# Patient Record
Sex: Female | Born: 1960 | Race: White | Hispanic: No | Marital: Single
Health system: Southern US, Academic
[De-identification: ages and names within clinical notes are randomized; demographics above are authoritative.]

## PROBLEM LIST (undated history)

## (undated) ENCOUNTER — Encounter

## (undated) ENCOUNTER — Ambulatory Visit

## (undated) ENCOUNTER — Telehealth

## (undated) ENCOUNTER — Inpatient Hospital Stay: Payer: PRIVATE HEALTH INSURANCE

## (undated) ENCOUNTER — Encounter: Attending: Surgery | Primary: Surgery

## (undated) ENCOUNTER — Telehealth: Attending: Social Worker | Primary: Social Worker

## (undated) ENCOUNTER — Ambulatory Visit
Payer: BLUE CROSS/BLUE SHIELD | Attending: Student in an Organized Health Care Education/Training Program | Primary: Student in an Organized Health Care Education/Training Program

## (undated) ENCOUNTER — Ambulatory Visit: Payer: BLUE CROSS/BLUE SHIELD

## (undated) ENCOUNTER — Ambulatory Visit: Payer: PRIVATE HEALTH INSURANCE

## (undated) ENCOUNTER — Encounter: Attending: Dermatology | Primary: Dermatology

## (undated) ENCOUNTER — Encounter: Attending: Adult Health | Primary: Adult Health

## (undated) ENCOUNTER — Inpatient Hospital Stay

## (undated) ENCOUNTER — Encounter: Attending: Nephrology | Primary: Nephrology

## (undated) ENCOUNTER — Encounter: Payer: PRIVATE HEALTH INSURANCE | Attending: Dermatology | Primary: Dermatology

## (undated) ENCOUNTER — Encounter
Attending: Student in an Organized Health Care Education/Training Program | Primary: Student in an Organized Health Care Education/Training Program

## (undated) ENCOUNTER — Telehealth
Attending: Student in an Organized Health Care Education/Training Program | Primary: Student in an Organized Health Care Education/Training Program

## (undated) ENCOUNTER — Encounter: Payer: PRIVATE HEALTH INSURANCE | Attending: Ophthalmology | Primary: Ophthalmology

## (undated) ENCOUNTER — Telehealth: Attending: Emergency Medicine | Primary: Emergency Medicine

## (undated) DIAGNOSIS — E119 Type 2 diabetes mellitus without complications: Secondary | ICD-10-CM

## (undated) HISTORY — PX: EYE SURGERY: SHX253

## (undated) MED ORDER — EMPAGLIFLOZIN 10 MG TABLET: Freq: Every day | ORAL | 0.00000 days

---
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
## 2007-10-10 ENCOUNTER — Ambulatory Visit: Payer: Self-pay | Admitting: Nephrology

## 2013-03-20 ENCOUNTER — Ambulatory Visit: Payer: Self-pay

## 2013-04-04 ENCOUNTER — Ambulatory Visit: Payer: Self-pay

## 2014-11-10 ENCOUNTER — Ambulatory Visit (HOSPITAL_COMMUNITY)
Admission: RE | Admit: 2014-11-10 | Discharge: 2014-11-10 | Disposition: A | Discharge status: Home / Self Care | Payer: Self-pay | Source: Ambulatory Visit | Attending: Family Medicine | Admitting: Family Medicine

## 2014-11-10 ENCOUNTER — Other Ambulatory Visit (HOSPITAL_COMMUNITY): Payer: Self-pay | Admitting: Family Medicine

## 2014-11-10 DIAGNOSIS — M7591 Shoulder lesion, unspecified, right shoulder: Secondary | ICD-10-CM

## 2014-11-10 DIAGNOSIS — M25511 Pain in right shoulder: Secondary | ICD-10-CM | POA: Insufficient documentation

## 2016-09-15 ENCOUNTER — Encounter: Payer: Self-pay | Admitting: Emergency Medicine

## 2016-09-15 ENCOUNTER — Emergency Department: Payer: BLUE CROSS/BLUE SHIELD

## 2016-09-15 DIAGNOSIS — S42202A Unspecified fracture of upper end of left humerus, initial encounter for closed fracture: Secondary | ICD-10-CM | POA: Diagnosis not present

## 2016-09-15 DIAGNOSIS — Y939 Activity, unspecified: Secondary | ICD-10-CM | POA: Insufficient documentation

## 2016-09-15 DIAGNOSIS — F17200 Nicotine dependence, unspecified, uncomplicated: Secondary | ICD-10-CM | POA: Diagnosis not present

## 2016-09-15 DIAGNOSIS — Y929 Unspecified place or not applicable: Secondary | ICD-10-CM | POA: Insufficient documentation

## 2016-09-15 DIAGNOSIS — Y999 Unspecified external cause status: Secondary | ICD-10-CM | POA: Insufficient documentation

## 2016-09-15 DIAGNOSIS — W010XXA Fall on same level from slipping, tripping and stumbling without subsequent striking against object, initial encounter: Secondary | ICD-10-CM | POA: Diagnosis not present

## 2016-09-15 DIAGNOSIS — E119 Type 2 diabetes mellitus without complications: Secondary | ICD-10-CM | POA: Insufficient documentation

## 2016-09-15 DIAGNOSIS — S4992XA Unspecified injury of left shoulder and upper arm, initial encounter: Secondary | ICD-10-CM | POA: Diagnosis present

## 2016-09-15 NOTE — ED Triage Notes (Signed)
Pt ambulatory to triage I NAD, report tripped and fell, injury to left shoulder, pain worse with movement, CSM intact

## 2016-09-16 ENCOUNTER — Emergency Department
Admission: EM | Admit: 2016-09-16 | Discharge: 2016-09-16 | Disposition: A | Discharge status: Home / Self Care | Payer: BLUE CROSS/BLUE SHIELD | Attending: Emergency Medicine | Admitting: Emergency Medicine

## 2016-09-16 DIAGNOSIS — S42292A Other displaced fracture of upper end of left humerus, initial encounter for closed fracture: Secondary | ICD-10-CM

## 2016-09-16 HISTORY — DX: Type 2 diabetes mellitus without complications: E11.9

## 2016-09-16 MED ORDER — ONDANSETRON 4 MG PO TBDP
4.0000 mg | ORAL_TABLET | Freq: Once | ORAL | Status: AC
  Administered 2016-09-16: 4 mg via ORAL

## 2016-09-16 MED ORDER — OXYCODONE-ACETAMINOPHEN 5-325 MG PO TABS
ORAL_TABLET | ORAL | Status: AC
  Administered 2016-09-16: 2 via ORAL
  Filled 2016-09-16: qty 2

## 2016-09-16 MED ORDER — OXYCODONE-ACETAMINOPHEN 5-325 MG PO TABS: 1.0000 | ORAL_TABLET | ORAL | 0 refills | Status: DC | PRN

## 2016-09-16 MED ORDER — ONDANSETRON 4 MG PO TBDP
ORAL_TABLET | ORAL | Status: AC
Start: 2016-09-16 — End: 2016-09-16
  Administered 2016-09-16: 4 mg via ORAL
  Filled 2016-09-16: qty 1

## 2016-09-16 MED ORDER — OXYCODONE-ACETAMINOPHEN 5-325 MG PO TABS
2.0000 | ORAL_TABLET | Freq: Once | ORAL | Status: AC
  Administered 2016-09-16: 2 via ORAL

## 2016-09-16 NOTE — ED Provider Notes (Signed)
[facility] Emergency Department Provider Note   First MD Initiated Contact with Patient 09/16/16 [phone]     (approximate)  I have reviewed the triage vital signs and the nursing notes.   HISTORY  Chief Complaint Fall    HPI Nicole Wolf is a 56 y.o. female presents with history of accidental trip and fall with injury to the left shoulder girdle before arrival. Patient states that the pain is currently 7 out of 10 and worse with movement. Patient denies any head injury no loss of consciousness.   Past Medical History:  Diagnosis Date  . Diabetes mellitus without complication (HCC)     There are no active problems to display for this patient.   Past surgical history Noncontributory  Prior to Admission medications   Not on File    Allergies Codeine; Morphine and related; and Sulfa antibiotics  History reviewed. No pertinent family history.  Social History Social History  Substance Use Topics  . Smoking status: Current Every Day Smoker  . Smokeless tobacco: Never Used  . Alcohol use No    Review of Systems Constitutional: No fever/chills Eyes: No visual changes. ENT: No sore throat. Cardiovascular: Denies chest pain. Respiratory: Denies shortness of breath. Gastrointestinal: No abdominal pain.  No nausea, no vomiting.  No diarrhea.  No constipation. Genitourinary: Negative for dysuria. Musculoskeletal: Negative for back pain.Positive for left shoulder pain Integumentary: Negative for rash. Neurological: Negative for headaches, focal weakness or numbness.   ____________________________________________   PHYSICAL EXAM:  VITAL SIGNS: ED Triage Vitals  Enc Vitals Group     BP 09/15/16 2259 (!) 152/83     Pulse Rate 09/15/16 2259 78     Resp 09/15/16 2259 18     Temp 09/15/16 2259 97.7 F (36.5 C)     Temp Source 09/15/16 2259 Oral     SpO2 09/15/16 2259 99 %     Weight 09/15/16 2300 160 lb (72.6 kg)     Height 09/15/16 2300  5\' 7"  (1.702 m)     Head Circumference --      Peak Flow --      Pain Score 09/15/16 2259 4     Pain Loc --      Pain Edu? --      Excl. in GC? --     Constitutional: Alert and oriented. Well appearing and in no acute distress. Eyes: Conjunctivae are normal. PERRL. EOMI. Head: Atraumatic. Mouth/Throat: Mucous membranes are moist. Neck: No stridor.  No cervical spine tenderness to palpation. Cardiovascular: Normal rate, regular rhythm. Good peripheral circulation. Grossly normal heart sounds. Respiratory: Normal respiratory effort.  No retractions. Lungs CTAB. Gastrointestinal: Soft and nontender. No distention.  Musculoskeletal: No lower extremity tenderness nor edema. No gross deformities of extremities. Pain left shoulder palpation anteriorly Neurologic:  Normal speech and language. No gross focal neurologic deficits are appreciated.  Skin:  Skin is warm, dry and intact. No rash noted. Psychiatric: Mood and affect are normal. Speech and behavior are normal.  ________________________ RADIOLOGY I, [facility] [place], personally viewed and evaluated these images (plain radiographs) as part of my medical decision making, as well as reviewing the written report by the radiologist.  Dg Shoulder Left  Result Date: 09/15/2016 CLINICAL DATA:  Persistent left shoulder pain after trip and fall injury tonight. EXAM: LEFT SHOULDER - 2+ VIEW COMPARISON:  None. FINDINGS: There is a nondisplaced humeral head fracture, with transverse component across the surgical neck and longitudinal component across the greater tuberosity.  No dislocation. No bone lesion or bony destruction IMPRESSION: Nondisplaced proximal humeral fracture. Electronically Signed   By: Ellery Plunk M.D.   On: 09/15/2016 23:33      Procedures   ____________________________________________   INITIAL IMPRESSION / ASSESSMENT AND PLAN / ED COURSE  Pertinent labs & imaging results that were available during my care of the  patient were reviewed by me and considered in my medical decision making (see chart for details).  Patient given Percocet for pain sling applied. X-ray revealed a humeral head fracture. Patient will be referred to orthopedic surgery for further outpatient evaluation and management.      ____________________________________________  FINAL CLINICAL IMPRESSION(S) / ED DIAGNOSES  Final diagnoses:  Humeral head fracture, left, closed, initial encounter     MEDICATIONS GIVEN DURING THIS VISIT:  Medications  oxyCODONE-acetaminophen (PERCOCET/ROXICET) 5-325 MG per tablet 2 tablet (2 tablets Oral Given 09/16/16 0254)  ondansetron (ZOFRAN-ODT) disintegrating tablet 4 mg (4 mg Oral Given 09/16/16 0254)     NEW OUTPATIENT MEDICATIONS STARTED DURING THIS VISIT:  New Prescriptions   No medications on file    Modified Medications   No medications on file    Discontinued Medications   No medications on file     Note:  This document was prepared using Dragon voice recognition software and may include unintentional dictation errors.    Darci Current, MD 09/20/16 [phone]

## 2016-09-16 NOTE — ED Notes (Signed)

## 2016-09-16 NOTE — ED Notes (Signed)
Sling applied to left shoulder upon assessment.

## 2016-09-16 NOTE — ED Notes (Signed)
MD BRown at bedside at thsi time.

## 2016-12-30 ENCOUNTER — Ambulatory Visit: Admit: 2016-12-30 | Discharge: 2016-12-30 | Payer: BC Managed Care – PPO

## 2016-12-30 DIAGNOSIS — S42202S Unspecified fracture of upper end of left humerus, sequela: Principal | ICD-10-CM

## 2016-12-30 DIAGNOSIS — M7502 Adhesive capsulitis of left shoulder: Secondary | ICD-10-CM

## 2017-02-08 ENCOUNTER — Ambulatory Visit: Admission: RE | Admit: 2017-02-08 | Discharge: 2017-02-08 | Attending: Dermatology | Admitting: Dermatology

## 2017-02-08 DIAGNOSIS — L818 Other specified disorders of pigmentation: Secondary | ICD-10-CM

## 2017-02-08 DIAGNOSIS — L578 Other skin changes due to chronic exposure to nonionizing radiation: Secondary | ICD-10-CM

## 2017-02-08 DIAGNOSIS — L409 Psoriasis, unspecified: Principal | ICD-10-CM

## 2017-02-08 MED ORDER — CLOBETASOL 0.05 % TOPICAL OINTMENT
5 refills | 0 days | Status: CP
Start: 2017-02-08 — End: ?

## 2017-04-19 ENCOUNTER — Ambulatory Visit: Admission: RE | Admit: 2017-04-19 | Discharge: 2017-04-19

## 2017-04-19 DIAGNOSIS — N184 Chronic kidney disease, stage 4 (severe): Principal | ICD-10-CM

## 2017-04-19 DIAGNOSIS — E1165 Type 2 diabetes mellitus with hyperglycemia: Secondary | ICD-10-CM

## 2017-04-19 DIAGNOSIS — N183 Chronic kidney disease, stage 3 (moderate): Secondary | ICD-10-CM

## 2017-04-19 DIAGNOSIS — Z794 Long term (current) use of insulin: Secondary | ICD-10-CM

## 2017-04-19 DIAGNOSIS — E1122 Type 2 diabetes mellitus with diabetic chronic kidney disease: Secondary | ICD-10-CM

## 2017-04-19 DIAGNOSIS — E559 Vitamin D deficiency, unspecified: Secondary | ICD-10-CM

## 2017-04-19 DIAGNOSIS — Q8781 Alport syndrome: Secondary | ICD-10-CM

## 2017-04-19 DIAGNOSIS — D631 Anemia in chronic kidney disease: Secondary | ICD-10-CM

## 2017-08-03 ENCOUNTER — Ambulatory Visit
Admit: 2017-08-03 | Discharge: 2017-08-04 | Payer: PRIVATE HEALTH INSURANCE | Attending: Ophthalmology | Primary: Ophthalmology

## 2017-08-03 DIAGNOSIS — H2633 Drug-induced cataract, bilateral: Principal | ICD-10-CM

## 2017-08-21 ENCOUNTER — Encounter: Admit: 2017-08-21 | Discharge: 2017-08-22 | Payer: PRIVATE HEALTH INSURANCE

## 2017-08-21 DIAGNOSIS — E1122 Type 2 diabetes mellitus with diabetic chronic kidney disease: Secondary | ICD-10-CM

## 2017-08-21 DIAGNOSIS — E785 Hyperlipidemia, unspecified: Secondary | ICD-10-CM

## 2017-08-21 DIAGNOSIS — R112 Nausea with vomiting, unspecified: Secondary | ICD-10-CM

## 2017-08-21 DIAGNOSIS — Z87898 Personal history of other specified conditions: Secondary | ICD-10-CM

## 2017-08-21 DIAGNOSIS — Z9889 Other specified postprocedural states: Secondary | ICD-10-CM

## 2017-08-21 DIAGNOSIS — I10 Essential (primary) hypertension: Secondary | ICD-10-CM

## 2017-08-21 DIAGNOSIS — N184 Chronic kidney disease, stage 4 (severe): Secondary | ICD-10-CM

## 2017-08-21 DIAGNOSIS — Z720 Tobacco use: Secondary | ICD-10-CM

## 2017-08-21 DIAGNOSIS — Z01818 Encounter for other preprocedural examination: Principal | ICD-10-CM

## 2017-08-21 DIAGNOSIS — Z794 Long term (current) use of insulin: Secondary | ICD-10-CM

## 2017-08-28 DIAGNOSIS — H2511 Age-related nuclear cataract, right eye: Principal | ICD-10-CM

## 2017-08-29 ENCOUNTER — Encounter
Admit: 2017-08-29 | Discharge: 2017-08-29 | Payer: PRIVATE HEALTH INSURANCE | Attending: Pain Medicine | Primary: Pain Medicine

## 2017-08-29 ENCOUNTER — Encounter: Admit: 2017-08-29 | Discharge: 2017-08-29 | Payer: PRIVATE HEALTH INSURANCE

## 2017-08-29 DIAGNOSIS — H2511 Age-related nuclear cataract, right eye: Principal | ICD-10-CM

## 2017-08-30 ENCOUNTER — Encounter
Admit: 2017-08-30 | Discharge: 2017-08-31 | Payer: PRIVATE HEALTH INSURANCE | Attending: Ophthalmology | Primary: Ophthalmology

## 2017-08-30 DIAGNOSIS — Z961 Presence of intraocular lens: Secondary | ICD-10-CM

## 2017-08-30 DIAGNOSIS — Z9841 Cataract extraction status, right eye: Principal | ICD-10-CM

## 2017-09-07 ENCOUNTER — Encounter
Admit: 2017-09-07 | Discharge: 2017-09-08 | Payer: PRIVATE HEALTH INSURANCE | Attending: Ophthalmology | Primary: Ophthalmology

## 2017-09-07 DIAGNOSIS — Z9841 Cataract extraction status, right eye: Principal | ICD-10-CM

## 2017-09-07 DIAGNOSIS — Z961 Presence of intraocular lens: Secondary | ICD-10-CM

## 2017-09-18 DIAGNOSIS — H2512 Age-related nuclear cataract, left eye: Principal | ICD-10-CM

## 2017-09-19 ENCOUNTER — Encounter
Admit: 2017-09-19 | Discharge: 2017-09-19 | Payer: PRIVATE HEALTH INSURANCE | Attending: Anesthesiology | Primary: Anesthesiology

## 2017-09-19 ENCOUNTER — Encounter: Admit: 2017-09-19 | Discharge: 2017-09-19 | Payer: PRIVATE HEALTH INSURANCE

## 2017-09-19 DIAGNOSIS — H2512 Age-related nuclear cataract, left eye: Principal | ICD-10-CM

## 2017-09-19 MED ORDER — PREDNISOLONE ACETATE 1 % EYE DROPS,SUSPENSION
Freq: Four times a day (QID) | OPHTHALMIC | 1 refills | 0 days | Status: CP
Start: 2017-09-19 — End: 2017-11-06

## 2017-09-19 MED ORDER — MOXIFLOXACIN 0.5 % EYE DROPS
Freq: Four times a day (QID) | OPHTHALMIC | 0 refills | 0 days | Status: CP
Start: 2017-09-19 — End: 2017-09-29

## 2017-09-20 ENCOUNTER — Encounter
Admit: 2017-09-20 | Discharge: 2017-09-21 | Payer: PRIVATE HEALTH INSURANCE | Attending: Ophthalmology | Primary: Ophthalmology

## 2017-09-20 DIAGNOSIS — Z9841 Cataract extraction status, right eye: Principal | ICD-10-CM

## 2017-09-20 DIAGNOSIS — Z9842 Cataract extraction status, left eye: Secondary | ICD-10-CM

## 2017-09-20 DIAGNOSIS — Z961 Presence of intraocular lens: Secondary | ICD-10-CM

## 2017-09-20 DIAGNOSIS — H1830 Unspecified corneal membrane change: Secondary | ICD-10-CM

## 2017-09-25 ENCOUNTER — Ambulatory Visit
Admit: 2017-09-25 | Discharge: 2017-09-26 | Payer: PRIVATE HEALTH INSURANCE | Attending: Dermatology | Primary: Dermatology

## 2017-09-25 DIAGNOSIS — L409 Psoriasis, unspecified: Principal | ICD-10-CM

## 2017-09-25 MED ORDER — HALOBETASOL PROPIONATE 0.05 % TOPICAL OINTMENT
5 refills | 0 days | Status: CP
Start: 2017-09-25 — End: ?

## 2017-09-25 NOTE — Unmapped (Signed)
??  Dermatology Clinic Outpatient     ??  Assessment and Plan:   ??  1. 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  Patient is significantly bothered by psoriatic lesions given chronic nature. Denies joint pain at this time.   - Approximately 5% TBSA involvement  - Discussed condition, chronicity, and management strategies including re-trial of alternative potent topical steroid cream in case tachyphylaxis has occurred, systemic management with apremilast, biologic therapy  - A mutual decision was made to start Halobetasol ointment BID to lesions pending insurance approval   - Apremilast starter pack was sent to [facility] with 30 mg qd dosing. Will need approval from Nephrologist Hazle Coca) given underlying kidney disease if approved. Methotrexate is not a safe medication given renal impairment  - If not approved or recommended by nephrologist, biologic therapy would be suitable for better control   - May obtain 10 minutes of natural sunlight daily to help with psoriasis. Avoid tanning bed use.   - Discussed importance of cutting down on smoking for general health and pso    2. 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  - Recommended routine skin protection including sunscreen.      RTC: pending medication approval or 3 months whichever is sooner     Subjective:   ??  Chief Complaint:   Chief Complaint   Patient presents with   ??? Psoriasis     breaking out on both legs       HPI: This is a pleasant 57 y.o. female who is seen today in follow up for management of psoriasis affecting elbows and lower legs. Applying clobetasol ointment daily under occlusion on elbows and lower legs without sig improvement. Used under occlusion without sig difference. Is very bothered by psoriatic lesions-due to appearance and recalcitrance. Has a history of Alport's disease and renal dysfunction. Followed by nephrologist-no active intervention yet and not on medication.   Has history of osteroarthritis but denies new onset or worsening, joint stiffness.     She denies any other new, changing, bleeding or worrisome skin lesions. She has no other skin concerns.      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 No recent illnesses, denies fevers, chills, loss of apetite or weight changes. No other skin complaints. Other than the HPI, the balance of 10 system reviewed is negative.    Objective:   ??  Physical Examination:  General: Well-developed, well-nourished female in no acute distress, resting comfortably.  Neuro: A&Ox 3. Answers questions appropriately.  Skin: Examination of the face, neck, chest, abdomen, back, bilateral upper extremities, bilateral lower extremities, palms, and nails was performed and notable for the following:  - nail pitting seen on few fingernails  - pink to red papules coalescing into hypertrophic plaques with overlying bilateral elbows and proximal forearms  - scattered, scaly round plaques on bilateral lower extremities  ______________________________________________________________________  The patient was seen and examined by Sheldon Silvan, MD who agrees with the assessment and plan as above.

## 2017-09-26 MED ORDER — APREMILAST 30 MG TABLET: 30 mg | tablet | 1 refills | 0 days

## 2017-09-26 MED ORDER — APREMILAST 30 MG TABLET
ORAL_TABLET | ORAL | 3 refills | 0.00000 days | Status: CP
Start: 2017-09-26 — End: 2017-09-26

## 2017-09-26 MED ORDER — APREMILAST 10 MG (4)-20 MG (4)-30 MG (19) TABLETS IN A DOSE PACK
PACK | 0 refills | 0 days | Status: CP
Start: 2017-09-26 — End: 2018-07-26

## 2017-09-26 MED ORDER — APREMILAST 10 MG (4)-20 MG (4)-30 MG (47) TABLETS IN A DOSE PACK
ORAL_TABLET | 0 refills | 0 days | Status: CP
Start: 2017-09-26 — End: 2018-07-26

## 2017-09-26 MED ORDER — APREMILAST 30 MG TABLET: 30 mg | tablet | 3 refills | 0 days

## 2017-09-26 NOTE — Unmapped (Signed)
Per test claim for OTEZLA at the [facility], patient needs Medication Assistance Program for Prior Authorization.

## 2017-09-28 ENCOUNTER — Encounter
Admit: 2017-09-28 | Discharge: 2017-09-29 | Payer: PRIVATE HEALTH INSURANCE | Attending: Ophthalmology | Primary: Ophthalmology

## 2017-09-28 DIAGNOSIS — Z9841 Cataract extraction status, right eye: Principal | ICD-10-CM

## 2017-09-28 DIAGNOSIS — Z9842 Cataract extraction status, left eye: Secondary | ICD-10-CM

## 2017-09-28 DIAGNOSIS — Z961 Presence of intraocular lens: Secondary | ICD-10-CM

## 2017-09-28 NOTE — Unmapped (Signed)
57 yo  WF s/p Phaco/IOL OU  (OD: 08/29/17; OS: 09/19/17) is much improved today. States OS still occasionally scratchy but hasn't started using ATs. Reports compliance with post-op meds, still using Pred QID OD. IOP borderline today, likely steroid response.  - Stop Moxifloxacin OS  - Taper Pred 3-2-1-Stop OU  - Okay to use Blink ATs for irritation.  - RTC Brydon Spahr in 3 weeks for MRx/DFE

## 2017-09-29 NOTE — Unmapped (Signed)
PA denied for Otezla(starter and maintenance dose),awaiting appeal.

## 2017-10-05 NOTE — Unmapped (Signed)
[facility] Specialty Medication Referral: PA APPROVED    Medication (Brand/Generic): OTEZLA    Initial [employer] Test Claim completed with resulted information below:  No PA required  Patient ABLE to fill at [facility]  Insurance Company:  [facility]  Anticipated Copay: $0  Is anticipated copay with a copay card or grant? NO    As Co-pay is under $100 defined limit, per policy there will be no further investigation of need for financial assistance at this time unless patient requests. This referral has been communicated to the provider and handed off to the [facility] team for further processing and filling of prescribed medication.   ______________________________________________________________________  Please utilize this referral for viewing purposes as it will serve as the central location for all relevant documentation and updates.

## 2017-10-16 ENCOUNTER — Encounter: Admit: 2017-10-16 | Discharge: 2017-10-16 | Payer: PRIVATE HEALTH INSURANCE

## 2017-10-16 DIAGNOSIS — D631 Anemia in chronic kidney disease: Secondary | ICD-10-CM

## 2017-10-16 DIAGNOSIS — Q8781 Alport syndrome: Secondary | ICD-10-CM

## 2017-10-16 DIAGNOSIS — N184 Chronic kidney disease, stage 4 (severe): Secondary | ICD-10-CM

## 2017-10-16 LAB — SODIUM: Sodium:SCnc:Pt:Ser/Plas:Qn:: 140

## 2017-10-16 LAB — BASIC METABOLIC PANEL
ANION GAP: 11 mmol/L (ref 9–15)
BLOOD UREA NITROGEN: 41 mg/dL — ABNORMAL HIGH (ref 7–21)
CALCIUM: 9.2 mg/dL (ref 8.5–10.2)
CHLORIDE: 109 mmol/L — ABNORMAL HIGH (ref 98–107)
CO2: 20 mmol/L — ABNORMAL LOW (ref 22.0–30.0)
CREATININE: 2.24 mg/dL — ABNORMAL HIGH (ref 0.60–1.00)
EGFR MDRD AF AMER: 28 mL/min/{1.73_m2} — ABNORMAL LOW (ref >=60)
GLUCOSE RANDOM: 180 mg/dL — ABNORMAL HIGH (ref 65–179)
POTASSIUM: 4.8 mmol/L (ref 3.5–5.0)
SODIUM: 140 mmol/L (ref 135–145)

## 2017-10-16 LAB — PHOSPHORUS: Phosphate:MCnc:Pt:Ser/Plas:Qn:: 3.4

## 2017-10-16 NOTE — Unmapped (Signed)
PCP:  Donzetta Matters, MD    10/25/2017  [facility] [address] [place]  [address]  [place] [address]  Dept: [phone]  Loc: [phone]    Chief Complaint: Alport syndrome, elevated creatinine    HPI:  [name] is a 57 y.o. 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 Her mother also had renal disease but it is unknown if this is due to Alports.     She has uncontrolled type 2 diabetes and is not checking her glucose regularly - her A1c was 8.5% in May. She does have a history of diabetic neuropathy but no known history of diabetic retinopathy. She did suffer from kidney stones in 1998 however has not had any issues with this since.    Her mother passed away since the last office visit.          ROS:   CONSTITUTIONAL: denies fevers or chills, denies unintentional weight loss   CARDIOVASCULAR: denies chest pain, denies dyspnea on exertion, denies leg edema  GASTROINTESTINAL: denies nausea, denies vomiting, denies anorexia  GENITOURINARY: denies dysuria, denies hematuria, denies decreased urinary stream  All 10 systems reviewed and are negative except as listed above.    PAST MEDICAL HISTORY:  Past Medical History:   Diagnosis Date   ??? Alport syndrome    ??? Back pain    ??? Chronic kidney disease    ??? Diabetes mellitus (CMS-HCC)    ??? Hyperlipidemia    ??? Hypertension    ??? Kidney stone    ??? Psoriasis        ALLERGIES  Sulfur-8; Codeine; Glucophage [metformin]; and Morphine    FAMILY HISTORY  Family History   Problem Relation Age of Onset   ??? Kidney disease Mother    ??? Diabetes Mother    ??? Cataracts Mother    ??? Heart attack Father        SOCIAL HISTORY   reports that she has been smoking.  She has been smoking about 1.00 pack per day. She has never used smokeless tobacco. She reports that she does not drink alcohol or use drugs.    MEDICATIONS:  Current Outpatient Medications   Medication Sig Dispense Refill   ??? amitriptyline (ELAVIL) 25 MG tablet AMITRIPTYLINE HCL 25 MG TABS     ??? apremilast (OTEZLA STARTER) 10 mg (4)-20 mg (4)-30 mg(19) DsPk tablet dosepak Take as instructed-starter pack 1 Package 0   ??? apremilast (OTEZLA) 30 mg Tab Please take 30 mg qd once starter pack completed 60 tablet 3   ??? calcipotriene (DOVONEX) 0.005 % cream DOVONEX 0.005 % CREA     ??? cholecalciferol, vitamin D3, (VITAMIN D3) 1,000 unit capsule Take 1,000 Units by mouth daily.     ??? clobetasol (TEMOVATE) 0.05 % ointment Apply to psoriasis rash 2 times daily as needed for rash. At night, apply medicine and then wrap in long sleeves or saran wrap 60 g 5   ??? halobetasol (ULTRAVATE) 0.05 % ointment Apply topically twice a day to psoriasis prn 60 g 5   ??? insulin ASPART (NOVOLOG) 100 unit/mL injection Inject under the skin Three (3) times a day before meals. 10 mL 12   ??? insulin glargine (LANTUS) 100 unit/mL injection Inject 80 Units under the skin nightly.     ??? lisinopril (PRINIVIL,ZESTRIL) 10 MG tablet Take 10 mg by mouth daily.     ??? prednisoLONE  acetate (PRED FORTE) 1 % ophthalmic suspension Administer 1 drop to the right eye Four (4) times a day. 5 mL 1   ??? rosuvastatin calcium (CRESTOR ORAL) Take by mouth.     ??? TRUEPLUS PEN NEEDLE 32 gauge x 5/32 Ndle   10   ??? aspirin (ECOTRIN) 81 MG tablet Take 81 mg by mouth daily.       No current facility-administered medications for this visit.        PHYSICAL EXAM:  Wt Readings from Last 3 Encounters:   10/16/17 73.8 kg (162 lb 11.2 oz)   09/19/17 72.6 kg (160 lb)   08/29/17 72.5 kg (159 lb 14.4 oz)     Temp Readings from Last 3 Encounters:   09/19/17 36.4 ??C (97.5 ??F) (Temporal)   08/29/17 36.3 ??C (97.3 ??F) (Temporal)     BP Readings from Last 5 Encounters:   10/16/17 119/71   09/19/17 109/60   08/29/17 103/60   08/21/17 117/62   04/19/17 105/67     Pulse Readings from Last 3 Encounters:   10/16/17 83   09/19/17 80   08/29/17 78       CONSTITUTIONAL: Alert,well appearing, no distress  HEENT: Moist mucous membranes, oropharynx clear without erythema or exudate  EYES: Extra ocular movements intact. Pupils reactive, sclerae anicteric.  NECK: Supple, no lymphadenopathy  CARDIOVASCULAR: Regular, normal S1/S2 heart sounds, no murmurs, no rubs.   PULM: Clear to auscultation bilaterally  GASTROINTESTINAL: Soft, active bowel sounds, nontender  EXTREMITIES: No lower extremity edema bilaterally.   SKIN: No rashes or lesions  NEUROLOGIC: No focal motor or sensory deficits    MEDICAL DECISION MAKING    03/28/17  HbA1c 8.2%, cholesterol 275, triglyceride 471  GLU 187, BUN 37, CR 2.12, EGFR 26, NA 139, K5.0, CL 105, CO2 19, CA 9.2    03/03/09  sodium 140, potassium 5.1, chloride 109, BUN 34, creatinine 1.82 with eGFR 30, glucose 99, albumin 4.2  Total cholesterol 251, triglycerides 340 and LDL 146.  WBC 8.3, hemoglobin 14.3, hematocrit 43.9 and platelets 181,000.  Hemoglobin A1c 6.9%.    Renal U/S showed right kidney 9.4cm and left 9.9cm.    Two simple cysts in the left kidney in the mid and lower poles measuring  1.3 and 0.9cm in diameter.     Creatinine   Date Value Ref Range Status   10/16/2017 2.24 (H) 0.60 - 1.00 mg/dL Final   29/51/8841 6.60 (H) 0.60 - 1.00 mg/dL Final   63/05/6008 9.32 (H) 0.60 - 1.00 mg/dL Final        Lab Results   Component Value Date    NA 140 10/16/2017    K 4.8 10/16/2017    CL 109 (H) 10/16/2017    CO2 20.0 (L) 10/16/2017    BUN 41 (H) 10/16/2017    CREATININE 2.24 (H) 10/16/2017    GFRAA 28 (L) 10/16/2017    GFRNONAA 23 (L) 10/16/2017    GLU 180 (H) 10/16/2017    CALCIUM 9.2 10/16/2017    PHOS 3.4 10/16/2017       Lab Results   Component Value Date    CALCIUM 9.2 10/16/2017       No results found for: BILITOT, BILIDIR, PROT, ALBUMIN, ALT, AST, ALKPHOS, GGT  No results found for: LABPROT, INR, APTT    HGB   Date Value Ref Range Status   09/27/2016 13.9 12.0 - 16.0 g/dL Final   35/57/3220 25.4 12.0 - 16.0 g/dL Final  Lab Results   Component Value Date    HGB 13.9 09/27/2016     No results found for: IRON, TIBC, FERRITIN    No results found for: A1C    No results found for: COLORU, LABSPEC, LABPH, PROTEINUR, GLUCOSEU, KETONESU, BLOODU, NITRITE]    No results found for: PROTEINUR, CREATUR  No results found for: PCRATIOUR    No results found for: CHOL, C3, C4, ANCA, A1C, ANA, HEPAIGG, HEPAIGM, HEPBIGM, HEPBCAB, HEPBSAG, HEPCAB, HIV12AB      03/03/09, sodium 140, potassium 5.1, chloride  109, BUN 34, creatinine 1.82 with eGFR 30, glucose 99 and albumin  4.2.  ??  Total cholesterol 251, triglycerides 340 and LDL 146.  ??  WBC 8.3, hemoglobin 14.3, hematocrit 43.9 and platelets 181,000.      04/29/08  BUN was 44, creatinine 1.86.   Sodium 141, potassium 4.9, calcium 9.4, phosphorus 4.1, albumin  4.0, AST 17, ALT 14, total cholesterol 231, triglycerides 297,  HDL 37, LDL 135.  Hemoglobin 13.8, TSH 1.48.  A1c was 7.5.    12/24/2007   glucose of 131, BUN of 34,   creatinine of 1.79 with an estimated GFR 31, potassium of 4.9, bicarbonate  of 19, calcium of 9.4, phosphorous of 3.9, albumin of 4.0, and hemoglobin  A1c of 7.3.  ??  A renal ultrasound done on 10/10/2007 showed mildly increased echotexture  of bilateral renal cortices, with the right kidney measuring 9.4  x 4.0 x 4.7 cm, and the left kidney measuring 9.4 x 4.2 x 4.2 cm,  with no hydronephrosis.  There are simple-appearing cysts in the  mid to lower pole of the left kidney.        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    Comment:  There is no evidence of a glomerulonephritis. ??Electron microscopy shows marked  thinning of the lamina densa along glomerular basement membranes associated with  an abnormal immunofluorescence staining pattern for collagen type 4 alpha 5 and  to a lesser degree of 3 subunits (mosaic staining). 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 ??The nephropathy has resulted in thinning of peripheral glomerular  capillary walls and glomerular as well tubulointerstitial fibrosis.    ASSESSMENT/PLAN:    Amy [name] is a 57 y.o. patient with a past medical history significant for DM2, Alport Being seen for reevaluation of her renal disease.     1. 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 Her creatinine remains around 2.3. We will monitor every 6 months.     2. CKD stage 4 - It has been difficult for [place] to get routine blood work on the patient. At this time, I would not make any changes to her medication regimen or care plan. I do recommend continuing the lisinopril and emphasizing the importance of hypertension and glucose control. Her renal biopsy did have a degree of interstitial fibrosis and glomerular sclerosis which are likely indicative of damage from these other conditions as well. The thinning of the basement membrane is likely due to the Alport syndrome.\    - We briefly discussed transplant today. [facility] might be a good referral location for her.   - We briefly discussed dialysis today. She could be a good home therapy candidate    3. DM2 - based  on her description of home blood glucose readings, this is uncontrolled at present. Defer to PCP for management. Metformin contraindicated for EGFR less than 30.    4. Hypertension - currently, this is well-controlled with lisinopril. At this time, I do not recommend any changes to her medication regimen that I would continue her on the ACE inhibitor for renal protection as long as feasible.     Ms.Devorah Tennison will follow up in  6 month(s) with a BMP, Hgb, Phos, vitamin D, PTH      Disclaimer: Much of the narrative of this dictation was acquired using speech recognition software. It is possible that some dictated speech was not transcribed accurately by this system nor detected in the proofing. Such errors are at times unavoidable.

## 2017-10-17 MED FILL — OTEZLA/10/20/30/TBPK: OTEZLA/10/20/30/TBPK | 30 days supply | Qty: 55 | Fill #0

## 2017-10-18 NOTE — Unmapped (Signed)
[facility]   Patient Onboarding/Medication Counseling    Amy [name] is a 57 y.o. female with psoriasis who I am counseling today on initiation of therapy.    Medication: [name]    Verified patient's date of birth / HIPAA.      Education Provided: ??    Dose/Administration discussed: 1 tab (10 mg) day 1-3, 1 tab (20 mg) day 4-5, then 30 mg once daily in the morning thereafter. This medication should be taken  without regard to food.  Stressed the importance of taking medication as prescribed and to contact provider if that changes at any time.  Discussed missed dose instructions.    Storage requirements: this medicine should be stored at room temperature.     Side effects / precautions discussed: Discussed common side effects, including potential gi upset: n/v/d, weigh loss, risk of mood changes/depression. If patient experiences mood changes or severe gi upset, they need to call the doctor.  Patient will receive a drug information handout with shipment.    Handling precautions / disposal reviewed:  n/a.    Drug Interactions: other medications reviewed and up to date in Epic.  No drug interactions identified.    Comorbidities/Allergies: reviewed and up to date in Epic.    Verified therapy is appropriate and should continue      Delivery Information    Medication Assistance provided: Prior Authorization    Anticipated copay of $0 reviewed with patient. Verified delivery address in FSI and reviewed medication storage requirement.    Scheduled delivery date: 10/18/17    Explained that we ship using UPS or courier and this shipment will not require a signature.      Explained the services we provide at [facility] and that each month we would call to set up refills.  Stressed importance of returning phone calls so that we could ensure they receive their medications in time each month.  Informed patient that we should be setting up refills 7-10 days prior to when they will run out of medication.  Informed patient that welcome packet will be sent.      Patient verbalized understanding of the above information as well as how to contact the pharmacy at [phone] option 4 with any questions/concerns.  The pharmacy is open Monday through Friday 8:30am-4:30pm.  A pharmacist is available 24/7 via pager to answer any clinical questions they may have.        Patient Specific Needs      ? Patient has no physical, cognitive, or cultural barriers.    ? Patient prefers to have medications discussed with  Patient     ? Patient is able to read and understand education materials at a high [place] level or above.    ? Patient's primary language is  English           Presenter, broadcasting  [facility] Specialty Pharmacist

## 2017-11-06 ENCOUNTER — Encounter
Admit: 2017-11-06 | Discharge: 2017-11-07 | Payer: PRIVATE HEALTH INSURANCE | Attending: Ophthalmology | Primary: Ophthalmology

## 2017-11-06 DIAGNOSIS — Z9841 Cataract extraction status, right eye: Principal | ICD-10-CM

## 2017-11-06 DIAGNOSIS — Z9842 Cataract extraction status, left eye: Secondary | ICD-10-CM

## 2017-11-06 DIAGNOSIS — Z961 Presence of intraocular lens: Secondary | ICD-10-CM

## 2017-11-06 NOTE — Unmapped (Signed)
57 yo  WF s/p Phaco/IOL OU  (OD: 08/29/17; OS: 09/19/17) is comfortable today. IOP back to baseline with completion of Pred taper. Pt desires to use Readers only.  - Suture left in place because it's buried  - Okay to use +2.50 readers  - RTC Optometry in 6 months for routine follow-up    I discussed and examined this patient with Dr. Arnette Norris. See my assessment and plan above. OJK.

## 2017-11-06 NOTE — Unmapped (Signed)
57 yo  WF s/p Phaco/IOL OU  (OD: 08/29/17; OS: 09/19/17) doing well today.  Reports compliance with post-op meds, still using Pred QID OD. IOP excellent today  - All post op drops now stopped.   - Okay to use Blink ATs for irritation.  -New MRx provided today   -Suture removed at slit lamp OD today  - RTC annual eye exam.

## 2017-11-08 NOTE — Unmapped (Signed)
Patient is doing ok on new medication - does report increase in headaches - were daily at first, but they have decreased in frequency. She has been using ibuprofen to treat. I cautioned against use given her renal disease - we prefer tylenol in her case. I asked her to report worsening or persistence of headaches, so that we could re-consider med or use alternative headache treatment if needed.    [facility] Refill and Clinical Coordination Note  Medication(s): [employer], DOB: 21-Mar-1961  Phone: [phone] (home) , Alternate phone contact: N/A  Shipping address: [address]  [place] [address]  Phone or address changes today?: No  All above HIPAA information verified.  Insurance changes? No    Completed refill and clinical call assessment today to schedule patient's medication shipment from the [facility] [phone]).      MEDICATION RECONCILIATION    Confirmed the medication and dosage are correct and have not changed: Yes, regimen is correct and unchanged.    Were there any changes to your medication(s) in the past month:  No, there are no changes reported at this time.    ADHERENCE    Is this medicine transplant or covered by Medicare Part B? No.    Did you miss any doses in the past 4 weeks? No missed doses reported.  Adherence counseling provided? Not needed     SIDE EFFECT MANAGEMENT    Are you tolerating your medication?:  Gloristine reports side effects of HEADACHES.  Side effect management discussed: using apap - and to let us know if worsens..      Therapy is appropriate and should be continued.    Evidence of clinical benefit: See Epic note from na/ not seen since starting medication      FINANCIAL/SHIPPING    Delivery Scheduled: Yes, Expected medication delivery date: Monday, July 1   Additional medications refilled: No additional medications/refills needed at this time.    The patient will receive an FSI print out for each medication shipped and additional FDA Medication Guides as required.  Patient education from [place] or Robet Leu may also be included in the shipment.    Cincere did not have any additional questions at this time.    Delivery address validated in FSI scheduling system: Yes, address listed above is correct.      We will follow up with patient monthly for standard refill processing and delivery.      Thank you,  Tawanna Solo Shared [facility] Specialty Pharmacist

## 2017-11-13 MED FILL — OTEZLA/30MG/TABS: OTEZLA/30MG/TABS | 30 days supply | Qty: 30 | Fill #0

## 2017-12-05 NOTE — Unmapped (Signed)
Patient reports improvement in GI effects and headaches. Headaches come and go, and she thinks may be related to allergies. She has limited use of ibuprofen. No other issues identified. She does see improvement of her skin. I encouraged her to schedule a follow appointment in the next month or two [name] started ~ June 5).    [facility] Refill and Clinical Coordination Note  Medication(s): [employer], DOB: April 26, 1961  Phone: [phone] (home) , Alternate phone contact: N/A  Shipping address: [address]  [place] [address]  Phone or address changes today?: No  All above HIPAA information verified.  Insurance changes? No    Completed refill and clinical call assessment today to schedule patient's medication shipment from the [facility] [phone]).      MEDICATION RECONCILIATION    Confirmed the medication and dosage are correct and have not changed: Yes, regimen is correct and unchanged.    Were there any changes to your medication(s) in the past month:  No, there are no changes reported at this time.    ADHERENCE    Is this medicine transplant or covered by Medicare Part B? No.    Did you miss any doses in the past 4 weeks? No missed doses reported.  Adherence counseling provided? Not needed     SIDE EFFECT MANAGEMENT    Are you tolerating your medication?:  Mayrin reports tolerating the medication.  Side effect management discussed: None      Therapy is appropriate and should be continued.    Evidence of clinical benefit: See Epic note from na/ not seen since starting      FINANCIAL/SHIPPING    Delivery Scheduled: Yes, Expected medication delivery date: Wed, July 31   Additional medications refilled: No additional medications/refills needed at this time.    The patient will receive an FSI print out for each medication shipped and additional FDA Medication Guides as required.  Patient education from [place] or Robet Leu may also be included in the shipment.    Caylen did not have any additional questions at this time.    Delivery address validated in FSI scheduling system: Yes, address listed above is correct.      We will follow up with patient monthly for standard refill processing and delivery.      Thank you,  Tawanna Solo Shared [facility] Specialty Pharmacist

## 2017-12-11 MED FILL — OTEZLA/30MG/TABS: OTEZLA/30MG/TABS | 30 days supply | Qty: 30 | Fill #1

## 2018-01-03 NOTE — Unmapped (Signed)
Patient is doing ok at this time  Her elbow doesn't look any better or worse  Everything looks about the same  Told her it might take some time to see a difference-she is ok with getting another delivery of it at this time  Asked if she had at least 7 days on hand at this time and she said yes-connection was very spotty-starting asking just yes or no questions    [facility] Refill Coordination Note    Specialty Medication(s) to be Shipped:   Inflammatory Disorders: Otezla    Other medication(s) to be shipped: n/a     [employer], DOB: November 19, 1960  Phone: [phone] (home)   Shipping Address: [address]  [place] [address]    All above HIPAA information was verified with patient.     Completed refill call assessment today to schedule patient's medication shipment from the [facility] [phone]).       Specialty medication(s) and dose(s) confirmed: Regimen is correct and unchanged.   Changes to medications: Cortny reports no changes reported at this time.  Changes to insurance: No  Questions for the pharmacist: No    The patient will receive a drug information handout for each medication shipped and additional FDA Medication Guides as required.      DISEASE/MEDICATION-SPECIFIC INFORMATION        patient has about 7 days on hand at least    ADHERENCE     Medication Adherence    Patient reported X missed doses in the last month:  0  Specialty Medication:  otezla  Patient is on additional specialty medications:  No  Patient is on more than two specialty medications:  No  Any gaps in refill history greater than 2 weeks in the last 3 months:  no  Demonstrates understanding of importance of adherence:  yes  Informant:  patient  Reliability of informant:  reliable  Confirmed plan for next specialty medication refill:  delivery by pharmacy  Refills needed for supportive medications:  not needed          Refill Coordination    Has the Patients' Contact Information Changed: No  Is the Shipping Address Different:  No         SHIPPING     Shipping address confirmed in Epic.     Delivery Scheduled: Yes, Expected medication delivery date: 8/27 (next day courier) via UPS or courier.     Renette Butters   [facility] Specialty Technician

## 2018-01-08 MED FILL — OTEZLA 30 MG TABLET: ORAL | 30 days supply | Qty: 30 | Fill #0

## 2018-01-08 MED FILL — OTEZLA 30 MG TABLET: 30 days supply | Qty: 30 | Fill #0 | Status: AC

## 2018-01-14 ENCOUNTER — Emergency Department
Admission: EM | Admit: 2018-01-14 | Discharge: 2018-01-14 | Disposition: A | Discharge status: Home / Self Care | Payer: BLUE CROSS/BLUE SHIELD | Attending: Emergency Medicine | Admitting: Emergency Medicine

## 2018-01-14 ENCOUNTER — Encounter: Payer: Self-pay | Admitting: Emergency Medicine

## 2018-01-14 DIAGNOSIS — L0231 Cutaneous abscess of buttock: Secondary | ICD-10-CM | POA: Diagnosis not present

## 2018-01-14 DIAGNOSIS — R222 Localized swelling, mass and lump, trunk: Secondary | ICD-10-CM | POA: Diagnosis present

## 2018-01-14 DIAGNOSIS — E119 Type 2 diabetes mellitus without complications: Secondary | ICD-10-CM | POA: Insufficient documentation

## 2018-01-14 DIAGNOSIS — F17210 Nicotine dependence, cigarettes, uncomplicated: Secondary | ICD-10-CM | POA: Diagnosis not present

## 2018-01-14 MED ORDER — CEPHALEXIN 500 MG PO CAPS: 500.0000 mg | ORAL_CAPSULE | Freq: Three times a day (TID) | ORAL | 0 refills | Status: AC

## 2018-01-14 MED ORDER — LIDOCAINE-EPINEPHRINE 2 %-1:100000 IJ SOLN
20.0000 mL | Freq: Once | INTRAMUSCULAR | Status: AC
Start: 2018-01-14 — End: 2018-01-14
  Administered 2018-01-14: 20 mL
  Filled 2018-01-14: qty 20

## 2018-01-14 NOTE — Discharge Instructions (Addendum)
You have been diagnosed with an abscess. We performed an I&D today. I have provided you with a RX for Keflex 500 mg 3 x day for 10 days. Take all the antibiotic even if you are feeling better. Follow up with your PCP as needed.

## 2018-01-14 NOTE — ED Triage Notes (Signed)
Patient reports that she has a "boil" to her rectal area times one month. Patient states that she has taken doxycycline and it has not improved.

## 2018-01-14 NOTE — ED Provider Notes (Signed)
[facility] Emergency Department Provider Note ____________________________________________  Time seen: 2045  I have reviewed the triage vital signs and the nursing notes.  HISTORY  Chief Complaint  Abscess   HPI Nicole Wolf is a 57 y.o. female to the ER today with complaint of a boil to her left buttock.  She reports she initially noticed this about 1 month ago.  She was seen at the [facility] clinic 3 weeks ago, placed on Clindamycin.  She did not notice any improvement with the antibiotics so she followed up with her PCP at the [facility] clinic.  The Clindamycin was stopped and she was switched to Doxycycline.  She finished a course of Doxycycline last night with no improvement in symptoms.  She denies fever, chills or body aches.  She has tried hot compresses without any relief.  Past Medical History:  Diagnosis Date  . Diabetes mellitus without complication (HCC)     There are no active problems to display for this patient.   Past Surgical History:  Procedure Laterality Date  . EYE SURGERY      Prior to Admission medications   Medication Sig Start Date End Date Taking? Authorizing Provider  cephALEXin (KEFLEX) 500 MG capsule Take 1 capsule (500 mg total) by mouth 3 (three) times daily for 10 days. 01/14/18 01/24/18  Lorre Munroe, NP  oxyCODONE-acetaminophen (ROXICET) 5-325 MG tablet Take 1 tablet by mouth every 4 (four) hours as needed for severe pain. 09/16/16   Darci Current, MD    Allergies Codeine; Morphine and related; and Sulfa antibiotics  No family history on file.  Social History Social History   Tobacco Use  . Smoking status: Current Every Day Smoker  . Smokeless tobacco: Never Used  Substance Use Topics  . Alcohol use: No  . Drug use: Never    Review of Systems  Constitutional: Negative for fever. Skin: Positive for abscess.  ____________________________________________  PHYSICAL EXAM:  VITAL SIGNS: ED Triage  Vitals  Enc Vitals Group     BP 01/14/18 2033 (!) 115/46     Pulse Rate 01/14/18 2033 86     Resp 01/14/18 2033 18     Temp 01/14/18 2033 98.6 F (37 C)     Temp Source 01/14/18 2033 Oral     SpO2 01/14/18 2033 100 %     Weight 01/14/18 2034 157 lb (71.2 kg)     Height 01/14/18 2034 5\' 6"  (1.676 m)     Head Circumference --      Peak Flow --      Pain Score 01/14/18 2033 10     Pain Loc --      Pain Edu? --      Excl. in GC? --     Constitutional: Alert and oriented. Well appearing and in no distress. Skin: 1.5 cm abscess noted of left buttock.  Fluctuant, no drainage.  No surrounding cellulitis noted. ____________________________________________  PROCEDURES  .Marland KitchenIncision and Drainage Date/Time: 01/14/2018 9:41 PM Performed by: Lorre Munroe, NP Authorized by: Lorre Munroe, NP   Consent:    Consent obtained:  Verbal   Consent given by:  Patient   Risks discussed:  Bleeding, incomplete drainage, pain and infection   Alternatives discussed:  No treatment Location:    Type:  Abscess   Location:  Anogenital   Anogenital location:  Gluteal cleft Pre-procedure details:    Skin preparation:  Betadine Anesthesia (see MAR for exact dosages):    Anesthesia  method:  Local infiltration   Local anesthetic:  Lidocaine 1% WITH epi Procedure type:    Complexity:  Simple Procedure details:    Incision types:  Stab incision   Incision depth:  Dermal   Scalpel blade:  11   Wound management:  Irrigated with saline   Drainage:  Bloody and purulent   Drainage amount:  Moderate   Wound treatment:  Wound left open   Packing materials:  None Post-procedure details:    Patient tolerance of procedure:  Tolerated well, no immediate complications    ____________________________________________  INITIAL IMPRESSION / ASSESSMENT AND PLAN / ED COURSE  Abscess of Left Buttock:  I&D performed- see procedure note Aftercare instructions provided eRx for Keflex 500 mg TID  prn ____________________________________________  FINAL CLINICAL IMPRESSION(S) / ED DIAGNOSES  Final diagnoses:  Abscess of left buttock      Lorre Munroe, NP 01/14/18 2142    Myrna Blazer, MD 01/14/18 2228

## 2018-01-17 ENCOUNTER — Encounter
Admit: 2018-01-17 | Discharge: 2018-01-18 | Payer: PRIVATE HEALTH INSURANCE | Attending: Dermatology | Primary: Dermatology

## 2018-01-17 DIAGNOSIS — L409 Psoriasis, unspecified: Principal | ICD-10-CM

## 2018-01-17 MED ORDER — CALCIPOTRIENE 0.005 % TOPICAL CREAM
Freq: Two times a day (BID) | TOPICAL | 1 refills | 0 days | Status: CP
Start: 2018-01-17 — End: 2019-01-17

## 2018-01-17 NOTE — Unmapped (Signed)
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  Patient is significantly bothered by psoriatic lesions given chronic nature. Denies joint pain at this time. Improving on otezla  - Approximately 5% TBSA involvement  - Discussed condition, chronicity, and management strategies including re-trial of alternative potent topical steroid cream in case tachyphylaxis has occurred, systemic management with apremilast, biologic therapy  -start clobetasol under occlusion on the elbows  - Cont Apremilast 30 mg qd dosing. Prev discussed with Nephrologist Systems analyst) given underlying kidney disease if approved. Methotrexate is not a safe medication given renal impairment   - May obtain 10 minutes of natural sunlight daily to help with psoriasis. Avoid tanning bed use.   - restart calcipotriene (DOVONOX) 0.005 % cream; Apply 1 application topically Two (2) times a day.  Dispense: 60 g; Refill: 1        RTC in 3 months  ______________________________________________________________________    CC:    Chief Complaint   Patient presents with   ??? Psoriasis     follow up, patient states that it has slightly improved since starting medication         HPI:  This is a pleasant 57 y.o. female last seen by Dr Hyman Bible who presents today for  F/up of psoriasis.Tolerating otezla well with mild diarrhea only. Worried about persistent lesions on the elbows. Currently not using any treatment there. Has had psoriasis for years. Started otezla on 09/2017. Denies other new, changing, non-healing, tender, or bleeding lesions.  No other skin concerns.        Pertinent Past Medical History:   Diabetes, HTN, Alport's disease  No history of skin cancer  ??  Family  History:   Mom with possible NMSC  Maternal uncle with psoriasis  ROS: Baseline state of health.  Denies fevers, chills. No other skin complaints except noted per HPI.     PE:  Gen: WD, WN, NAD, A&O  Skin: Per patient request, examination of the head, neck, chest, back, ,buttocks, bilateral upper extremities,and bilateral lower extremities was performed and significant for the below. All other areas examined were normal or had no significant findings.   -well demarcated scaly plaques on the elbows  - nail pitting seen on few fingernails

## 2018-01-31 ENCOUNTER — Other Ambulatory Visit: Payer: Self-pay | Admitting: Family Medicine

## 2018-01-31 DIAGNOSIS — Z1231 Encounter for screening mammogram for malignant neoplasm of breast: Secondary | ICD-10-CM

## 2018-02-01 NOTE — Unmapped (Signed)
Patient is doing well at this time  She wasn't at home to see how much she had on hand but thought at least a week- from our last shipment she at least has 6 days on hand  She was ok with [place] setting up a delivery for next Wednesday  No changes to meds reported at this time    [facility] Refill Coordination Note    Specialty Medication(s) to be Shipped:   Inflammatory Disorders: Otezla    Other medication(s) to be shipped: n/a     [employer], DOB: May 22, 1960  Phone: [phone] (home)   Shipping Address: [address]  [place] [address]    All above HIPAA information was verified with patient.     Completed refill call assessment today to schedule patient's medication shipment from the [facility] [phone]).       Specialty medication(s) and dose(s) confirmed: Regimen is correct and unchanged.   Changes to medications: Desta reports no changes reported at this time.  Changes to insurance: No  Questions for the pharmacist: No    The patient will receive a drug information handout for each medication shipped and additional FDA Medication Guides as required.      DISEASE/MEDICATION-SPECIFIC INFORMATION        N/A    ADHERENCE     Medication Adherence    Patient reported X missed doses in the last month:  0  Specialty Medication:  otezla  Patient is on additional specialty medications:  No  Patient is on more than two specialty medications:  No  Any gaps in refill history greater than 2 weeks in the last 3 months:  no  Demonstrates understanding of importance of adherence:  yes  Informant:  patient  Reliability of informant:  reliable  Confirmed plan for next specialty medication refill:  delivery by pharmacy  Refills needed for supportive medications:  not needed          Refill Coordination    Has the Patients' Contact Information Changed:  No  Is the Shipping Address Different:  No           SHIPPING     Shipping address confirmed in Epic.     Delivery Scheduled: Yes, Expected medication delivery date: 9/25 via UPS or courier. (next day courier)    Renette Butters   [facility] Specialty Technician

## 2018-02-06 MED FILL — OTEZLA 30 MG TABLET: ORAL | 30 days supply | Qty: 30 | Fill #1

## 2018-02-06 MED FILL — OTEZLA 30 MG TABLET: 30 days supply | Qty: 30 | Fill #1 | Status: AC

## 2018-02-21 NOTE — Unmapped (Signed)
This patients last WCC/CPE date: : Not Found    Does patient have any new medical concerns: `  If yes, were you able to schedule a return or follow up visit: No.    If no new medical concerns, scheduled CPE with Ngwe AchiriMofor Aycock, MD on 11/13  Is this physical scheduled more than 365 days from last CPE: Yes.    Outreach flow sheet completed: Yes.    Patient Due for: None    Were the above open gaps addressed:No.   Patient contact did not originate through upfront.        [facility]  FRONT DESK REP-MARY ELLA

## 2018-02-28 NOTE — Unmapped (Signed)
Patient is doing ok- she said she's not really seeing any benefit from the [place]  She has been using ointment that helps her but still having issues  Sending message to md to see if they can recommend anything for her or change up something    Sending refill request- delivery for otezla set for 10/23    She just started the last bottle that was shipped out to her- she has missed some doses- she just forgets it sometimes  She has had a cold as well- still has the cold    [facility] Refill Coordination Note    Specialty Medication(s) to be Shipped:   Inflammatory Disorders: Otezla    Other medication(s) to be shipped: n/a     Amy [name], DOB: 1960/09/16  Phone: [phone] (home)   Shipping Address: [address]  [place] [address]    All above HIPAA information was verified with patient.     Completed refill call assessment today to schedule patient's medication shipment from the [facility] [phone]).       Specialty medication(s) and dose(s) confirmed: Regimen is correct and unchanged.   Changes to medications: Amy [name] reports no changes reported at this time.  Changes to insurance: No  Questions for the pharmacist: No    The patient will receive a drug information handout for each medication shipped and additional FDA Medication Guides as required.      DISEASE/MEDICATION-SPECIFIC INFORMATION        patient has approx 20-25 days of medication on hand at this time    ADHERENCE     Medication Adherence    Patient reported X missed doses in the last month:  >5  Specialty Medication:  otezla  Patient is on additional specialty medications:  No  Patient is on more than two specialty medications:  No  Any gaps in refill history greater than 2 weeks in the last 3 months:  no  Demonstrates understanding of importance of adherence:  yes  Informant:  patient              Confirmed plan for next specialty medication refill:  delivery by pharmacy  Refills needed for supportive medications:  not needed          SHIPPING     Shipping address confirmed in Epic.     Delivery Scheduled: Yes, Expected medication delivery date: 10/23.  However, Rx request for refills was sent to the provider as there are none remaining.        Amy [name]   [facility] Shared [facility] Specialty Technician

## 2018-03-01 MED ORDER — APREMILAST 30 MG TABLET
ORAL_TABLET | ORAL | 1 refills | 0 days | Status: CP
Start: 2018-03-01 — End: 2018-04-25
  Filled 2018-03-06: qty 30, 30d supply, fill #0

## 2018-03-01 NOTE — Unmapped (Signed)
This patients last WCC/CPE date: : Not Found    Outreached to patient to schedule Complete Physical Examination.  Patient previously scheduled cpe for November and pt was transferred over to [facility] to schedule mammogram.  Outreach flow sheet completed: Yes.

## 2018-03-06 MED FILL — OTEZLA 30 MG TABLET: 30 days supply | Qty: 30 | Fill #0 | Status: AC

## 2018-03-23 ENCOUNTER — Ambulatory Visit
Admission: RE | Admit: 2018-03-23 | Discharge: 2018-03-23 | Disposition: A | Discharge status: Home / Self Care | Payer: BLUE CROSS/BLUE SHIELD | Source: Ambulatory Visit | Attending: Family Medicine | Admitting: Family Medicine

## 2018-03-23 DIAGNOSIS — Z1231 Encounter for screening mammogram for malignant neoplasm of breast: Secondary | ICD-10-CM

## 2018-03-28 NOTE — Unmapped (Signed)
[facility] Refill Coordination Note  Specialty Medication(s): otezla 30 mg  Additional Medications shipped: none    Darrell Farnworth, DOB: 1960/06/15  Phone: [phone] (home) , Alternate phone contact: N/A  Phone or address changes today?: No  All above HIPAA information was verified with patient.  Shipping Address: [address]  [place] [address]   Insurance changes? No    Completed refill call assessment today to schedule patient's medication shipment from the [facility] [phone]).      Confirmed the medication and dosage are correct and have not changed: Yes, regimen is correct and unchanged.    Confirmed patient started or stopped the following medications in the past month:  No, there are no changes reported at this time.    Are you tolerating your medication?:  Natoria reports tolerating the medication.    ADHERENCE    (Below is required for Medicare Part B or Transplant patients only - per drug):   How many tablets were dispensed last month:  30 tabs  Patient currently has 2 weeks remaining.    Did you miss any doses in the past 4 weeks? No missed doses reported.    FINANCIAL/SHIPPING    Delivery Scheduled: Yes, Expected medication delivery date: 04/06/18     Medication will be delivered via Next Day Courier to the home address in [facility].    The patient will receive a drug information handout for each medication shipped and additional FDA Medication Guides as required.      Maylen did not have any additional questions at this time.    We will follow up with patient monthly for standard refill processing and delivery.      Thank you,  Unk Lightning   [facility] Shared [facility] Specialty Technician

## 2018-03-29 ENCOUNTER — Other Ambulatory Visit: Payer: Self-pay | Admitting: Family Medicine

## 2018-03-29 DIAGNOSIS — N6310 Unspecified lump in the right breast, unspecified quadrant: Secondary | ICD-10-CM

## 2018-03-29 DIAGNOSIS — R928 Other abnormal and inconclusive findings on diagnostic imaging of breast: Secondary | ICD-10-CM

## 2018-04-05 MED FILL — OTEZLA 30 MG TABLET: 30 days supply | Qty: 30 | Fill #1 | Status: AC

## 2018-04-05 MED FILL — OTEZLA 30 MG TABLET: ORAL | 30 days supply | Qty: 30 | Fill #1

## 2018-04-11 ENCOUNTER — Ambulatory Visit
Admission: RE | Admit: 2018-04-11 | Discharge: 2018-04-11 | Disposition: A | Discharge status: Home / Self Care | Payer: BLUE CROSS/BLUE SHIELD | Source: Ambulatory Visit | Attending: Family Medicine | Admitting: Family Medicine

## 2018-04-11 DIAGNOSIS — R928 Other abnormal and inconclusive findings on diagnostic imaging of breast: Secondary | ICD-10-CM | POA: Diagnosis not present

## 2018-04-11 DIAGNOSIS — N6310 Unspecified lump in the right breast, unspecified quadrant: Secondary | ICD-10-CM | POA: Diagnosis present

## 2018-04-18 ENCOUNTER — Ambulatory Visit: Admit: 2018-04-18 | Discharge: 2018-04-19 | Payer: PRIVATE HEALTH INSURANCE

## 2018-04-18 ENCOUNTER — Ambulatory Visit
Admit: 2018-04-18 | Discharge: 2018-04-19 | Payer: PRIVATE HEALTH INSURANCE | Attending: Dermatology | Primary: Dermatology

## 2018-04-18 DIAGNOSIS — E1122 Type 2 diabetes mellitus with diabetic chronic kidney disease: Secondary | ICD-10-CM

## 2018-04-18 DIAGNOSIS — N184 Chronic kidney disease, stage 4 (severe): Secondary | ICD-10-CM

## 2018-04-18 DIAGNOSIS — D631 Anemia in chronic kidney disease: Secondary | ICD-10-CM

## 2018-04-18 DIAGNOSIS — Q8781 Alport syndrome: Secondary | ICD-10-CM

## 2018-04-18 DIAGNOSIS — Z794 Long term (current) use of insulin: Secondary | ICD-10-CM

## 2018-04-18 DIAGNOSIS — E1165 Type 2 diabetes mellitus with hyperglycemia: Secondary | ICD-10-CM

## 2018-04-18 MED ORDER — SODIUM BICARBONATE 650 MG TABLET
ORAL_TABLET | Freq: Two times a day (BID) | ORAL | 11 refills | 0 days | Status: CP
Start: 2018-04-18 — End: 2019-04-18

## 2018-04-18 NOTE — Unmapped (Addendum)
PCP:  Emogene Morgan, MD    04/18/2018  [facility] [address] [place]  [address]  [place] [address]  Dept: [phone]  Loc: [phone]    Chief Complaint: Alport syndrome, elevated creatinine    HPI:  [name] is a 57 y.o. 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 Her mother also had renal disease but it is unknown if this was due to Alports.     She has uncontrolled type 2 diabetes and is not checking her glucose regularly - her A1c was 8.4% in September. She does have a history of diabetic neuropathy but no known history of diabetic retinopathy. She did suffer from kidney stones in 1998 however has not had any issues with this since.        ROS:   CONSTITUTIONAL: denies fevers or chills, denies unintentional weight loss   CARDIOVASCULAR: denies chest pain, denies dyspnea on exertion, denies leg edema  GASTROINTESTINAL: denies nausea, denies vomiting, denies anorexia  GENITOURINARY: denies dysuria, denies hematuria, denies decreased urinary stream  All 10 systems reviewed and are negative except as listed above.    PAST MEDICAL HISTORY:  Past Medical History:   Diagnosis Date   ??? Alport syndrome    ??? Back pain    ??? Chronic kidney disease    ??? Diabetes mellitus (CMS-HCC)    ??? Hyperlipidemia    ??? Hypertension    ??? Kidney stone    ??? Psoriasis        ALLERGIES  Sulfur-8; Codeine; Glucophage [metformin]; and Morphine    FAMILY HISTORY  Family History   Problem Relation Age of Onset   ??? Kidney disease Mother    ??? Diabetes Mother    ??? Cataracts Mother    ??? Heart attack Father        SOCIAL HISTORY   reports that she has been smoking. She has been smoking about 1.00 pack per day. She has never used smokeless tobacco. She reports that she does not drink alcohol or use drugs.    MEDICATIONS:  Current Outpatient Medications   Medication Sig Dispense Refill   ??? aspirin (ECOTRIN) 81 MG tablet Take 81 mg by mouth daily.     ??? calcipotriene (DOVONEX) 0.005 % cream DOVONEX 0.005 % CREA     ??? cholecalciferol, vitamin D3, (VITAMIN D3) 1,000 unit capsule Take 1,000 Units by mouth daily.     ??? clobetasol (TEMOVATE) 0.05 % ointment Apply to psoriasis rash 2 times daily as needed for rash. At night, apply medicine and then wrap in long sleeves or saran wrap 60 g 5   ??? insulin ASPART (NOVOLOG) 100 unit/mL injection Inject under the skin Three (3) times a day before meals. 10 mL 12   ??? insulin glargine (LANTUS) 100 unit/mL injection Inject 80 Units under the skin nightly.     ??? lisinopril (PRINIVIL,ZESTRIL) 10 MG tablet Take 10 mg by mouth daily.     ??? methocarbamol (ROBAXIN) 500 MG tablet   0   ??? rosuvastatin calcium (CRESTOR ORAL) Take by mouth.     ??? amitriptyline (ELAVIL) 25 MG tablet AMITRIPTYLINE HCL 25 MG TABS     ??? apremilast (OTEZLA STARTER) 10 mg (4)-20 mg (4)-30 mg(19) DsPk tablet dosepak Take as instructed-starter pack (Patient not taking: Reported on 04/18/2018) 1 Package 0   ??? apremilast 10 mg (4)-20 mg (4)-30 mg (47) DsPk TAKE  10 MG ONCE DAILY IN THE MORNING DAYS 1-3 20 MG ONCE DAILY IN THE MORNING DAYS 4-5 THEN 30 MG ONCE DAILY IN THE MORNING THEREAFTER (DON'T FOLLOW PACKAGE) (Patient not taking: Reported on 04/18/2018) 55 tablet 0   ??? apremilast 30 mg Tab TAKE 1 TABLET BY MOUTH ONCE DAILY (Patient not taking: Reported on 04/18/2018) 30 tablet 1   ??? betamethasone dipropionate (DIPROLENE) 0.05 % ointment   4   ??? calcipotriene (DOVONOX) 0.005 % cream Apply 1 application topically Two (2) times a day. 60 g 1   ??? cephalexin (KEFLEX) 500 MG capsule   0   ??? halobetasol (ULTRAVATE) 0.05 % ointment Apply topically twice a day to psoriasis prn (Patient not taking: Reported on 01/17/2018) 60 g 5   ??? sodium bicarbonate 650 mg tablet Take 1 tablet (650 mg total) by mouth Two (2) times a day. 60 tablet 11   ??? TRUEPLUS PEN NEEDLE 32 gauge x 5/32 Ndle   10     No current facility-administered medications for this visit. PHYSICAL EXAM:  Wt Readings from Last 3 Encounters:   04/18/18 70.3 kg (154 lb 14.4 oz)   10/16/17 73.8 kg (162 lb 11.2 oz)   09/19/17 72.6 kg (160 lb)     Temp Readings from Last 3 Encounters:   09/19/17 36.4 ??C (97.5 ??F) (Temporal)   08/29/17 36.3 ??C (97.3 ??F) (Temporal)     BP Readings from Last 5 Encounters:   04/18/18 128/74   10/16/17 119/71   09/19/17 109/60   08/29/17 103/60   08/21/17 117/62     Pulse Readings from Last 3 Encounters:   04/18/18 75   10/16/17 83   09/19/17 80       CONSTITUTIONAL: Alert,well appearing, no distress  HEENT: Moist mucous membranes, oropharynx clear without erythema or exudate  EYES: Extra ocular movements intact. Pupils reactive, sclerae anicteric.  NECK: Supple, no lymphadenopathy  CARDIOVASCULAR: Regular, normal S1/S2 heart sounds, no murmurs, no rubs.   PULM: Clear to auscultation bilaterally  GASTROINTESTINAL: Soft, active bowel sounds, nontender  EXTREMITIES: No lower extremity edema bilaterally.   SKIN: No rashes or lesions  NEUROLOGIC: No focal motor or sensory deficits    MEDICAL DECISION MAKING    01/24/2018  HbA1c 8.4%  Ferritin 120  B12 981, folic acid 11.8  Hgb 12.9, HCT 38.8  Glucose 95, BUN 46, CR 2.51, EGFR 21  NA 143, K5.0, CL 109, CO2 17, CA 9.7, ALB 4.1    03/28/17  HbA1c 8.2%, cholesterol 275, triglyceride 471  GLU 187, BUN 37, CR 2.12, EGFR 26, NA 139, K5.0, CL 105, CO2 19, CA 9.2    03/03/09  sodium 140, potassium 5.1, chloride 109, BUN 34, creatinine 1.82 with eGFR 30, glucose 99, albumin 4.2  Total cholesterol 251, triglycerides 340 and LDL 146.  WBC 8.3, hemoglobin 14.3, hematocrit 43.9 and platelets 181,000.  Hemoglobin A1c 6.9%.    Renal U/S showed right kidney 9.4cm and left 9.9cm.    Two simple cysts in the left kidney in the mid and lower poles measuring  1.3 and 0.9cm in diameter.     Creatinine   Date Value Ref Range Status   10/16/2017 2.24 (H) 0.60 - 1.00 mg/dL Final   16/02/9603 5.40 (H) 0.60 - 1.00 mg/dL Final   98/03/9146 8.29 (H) 0.60 - 1.00 mg/dL Final        Lab Results   Component Value Date    NA 140 10/16/2017    K 4.8 10/16/2017  CL 109 (H) 10/16/2017    CO2 20.0 (L) 10/16/2017    BUN 41 (H) 10/16/2017    CREATININE 2.24 (H) 10/16/2017    GFRAA 28 (L) 10/16/2017    GFRNONAA 23 (L) 10/16/2017    GLU 180 (H) 10/16/2017    CALCIUM 9.2 10/16/2017    PHOS 3.4 10/16/2017       Lab Results   Component Value Date    CALCIUM 9.2 10/16/2017       No results found for: BILITOT, BILIDIR, PROT, ALBUMIN, ALT, AST, ALKPHOS, GGT  No results found for: LABPROT, INR, APTT    HGB   Date Value Ref Range Status   09/27/2016 13.9 12.0 - 16.0 g/dL Final   16/02/9603 54.0 12.0 - 16.0 g/dL Final        Lab Results   Component Value Date    HGB 13.9 09/27/2016     No results found for: IRON, TIBC, FERRITIN    No results found for: A1C    No results found for: COLORU, LABSPEC, LABPH, PROTEINUR, GLUCOSEU, KETONESU, BLOODU, NITRITE]    No results found for: PROTEINUR, CREATUR  No results found for: PCRATIOUR    No results found for: CHOL, C3, C4, ANCA, A1C, ANA, HEPAIGG, HEPAIGM, HEPBIGM, HEPBCAB, HEPBSAG, HEPCAB, HIV12AB      03/03/09, sodium 140, potassium 5.1, chloride  109, BUN 34, creatinine 1.82 with eGFR 30, glucose 99 and albumin  4.2.  ??  Total cholesterol 251, triglycerides 340 and LDL 146.  ??  WBC 8.3, hemoglobin 14.3, hematocrit 43.9 and platelets 181,000.      04/29/08  BUN was 44, creatinine 1.86.   Sodium 141, potassium 4.9, calcium 9.4, phosphorus 4.1, albumin  4.0, AST 17, ALT 14, total cholesterol 231, triglycerides 297,  HDL 37, LDL 135.  Hemoglobin 13.8, TSH 1.48.  A1c was 7.5.    12/24/2007   glucose of 131, BUN of 34,   creatinine of 1.79 with an estimated GFR 31, potassium of 4.9, bicarbonate  of 19, calcium of 9.4, phosphorous of 3.9, albumin of 4.0, and hemoglobin  A1c of 7.3.  ??  A renal ultrasound done on 10/10/2007 showed mildly increased echotexture  of bilateral renal cortices, with the right kidney measuring 9.4  x 4.0 x 4.7 cm, and the left kidney measuring 9.4 x 4.2 x 4.2 cm,  with no hydronephrosis.  There are simple-appearing cysts in the  mid to lower pole of the left kidney.        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    Comment:  There is no evidence of a glomerulonephritis. ??Electron microscopy shows marked  thinning of the lamina densa along glomerular basement membranes associated with  an abnormal immunofluorescence staining pattern for collagen type 4 alpha 5 and  to a lesser degree of 3 subunits (mosaic staining). 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 ??The nephropathy has resulted in thinning of peripheral glomerular  capillary walls and glomerular as well tubulointerstitial fibrosis.    ASSESSMENT/PLAN:    Ms.Amy [name] is a 57 y.o. patient with a past medical history significant for DM2, Alport Being seen for reevaluation of her renal disease.     1. 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 Her creatinine is worse at 2.5.  We will monitor every 6 months.     2. CKD stage 4 -  I do recommend continuing the lisinopril and emphasizing the importance of hypertension and glucose control. Her renal biopsy did have a degree of interstitial fibrosis and glomerular sclerosis which are likely indicative of damage from these other conditions as well. The thinning of the basement membrane is likely due to the Alport syndrome.    - We briefly discussed transplant today. [facility] might be a good referral location for her. Referral placed today  - We briefly discussed dialysis. She would be a good home therapy candidate. There is no indication for dialysis at this time.     3. DM2 - based on her description of home blood glucose readings, this is uncontrolled at present. Defer to PCP for management. Metformin contraindicated for EGFR less than 30.    4. Hypertension - currently, this is well-controlled with lisinopril. At this time, I do not recommend any changes to her medication regimen that I would continue her on the ACE inhibitor for renal protection as long as feasible.     5. Acidosis - start NaBicarb 650mg  BID today    Ms.Amy [name] will follow up in 4 month(s) with a BMP, Hgb, Phos, vitamin D, PTH      Disclaimer: Much of the narrative of this dictation was acquired using speech recognition software. It is possible that some dictated speech was not transcribed accurately by this system nor detected in the proofing. Such errors are at times unavoidable.

## 2018-04-19 ENCOUNTER — Other Ambulatory Visit: Payer: Self-pay | Admitting: Family Medicine

## 2018-04-19 DIAGNOSIS — N6310 Unspecified lump in the right breast, unspecified quadrant: Secondary | ICD-10-CM

## 2018-04-19 DIAGNOSIS — R928 Other abnormal and inconclusive findings on diagnostic imaging of breast: Secondary | ICD-10-CM

## 2018-04-26 MED ORDER — APREMILAST 30 MG TABLET
ORAL_TABLET | ORAL | 1 refills | 0 days | Status: CP
Start: 2018-04-26 — End: 2018-07-26
  Filled 2018-06-01: qty 30, 30d supply, fill #0

## 2018-05-01 NOTE — Unmapped (Signed)
12/17 - spoke to pt she stated she had at least 1 month supply of medication on hand. We will reschedule refill and clinical for first week in January.

## 2018-05-22 NOTE — Unmapped (Signed)
[facility] Clinical Assessment & Refill Coordination Note    Amy [name], DOB: [birth year]  Phone: [phone] (home)     All above HIPAA information was verified with patient.     Specialty Medication(s):   Inflammatory Disorders: [name]     Current Outpatient Medications   Medication Sig Dispense Refill   ??? amitriptyline (ELAVIL) 25 MG tablet AMITRIPTYLINE HCL 25 MG TABS     ??? apremilast (OTEZLA STARTER) 10 mg (4)-20 mg (4)-30 mg(19) DsPk tablet dosepak Take as instructed-starter pack (Patient not taking: Reported on 04/18/2018) 1 Package 0   ??? apremilast 10 mg (4)-20 mg (4)-30 mg (47) DsPk TAKE 10 MG ONCE DAILY IN THE MORNING DAYS 1-3 20 MG ONCE DAILY IN THE MORNING DAYS 4-5 THEN 30 MG ONCE DAILY IN THE MORNING THEREAFTER (DON'T FOLLOW PACKAGE) (Patient not taking: Reported on 04/18/2018) 55 tablet 0   ??? apremilast 30 mg Tab TAKE 1 TABLET BY MOUTH ONCE DAILY 30 tablet 1   ??? aspirin (ECOTRIN) 81 MG tablet Take 81 mg by mouth daily.     ??? betamethasone dipropionate (DIPROLENE) 0.05 % ointment   4   ??? calcipotriene (DOVONEX) 0.005 % cream DOVONEX 0.005 % CREA     ??? calcipotriene (DOVONOX) 0.005 % cream Apply 1 application topically Two (2) times a day. 60 g 1   ??? cephalexin (KEFLEX) 500 MG capsule   0   ??? cholecalciferol, vitamin D3, (VITAMIN D3) 1,000 unit capsule Take 1,000 Units by mouth daily.     ??? clobetasol (TEMOVATE) 0.05 % ointment Apply to psoriasis rash 2 times daily as needed for rash. At night, apply medicine and then wrap in long sleeves or saran wrap 60 g 5   ??? halobetasol (ULTRAVATE) 0.05 % ointment Apply topically twice a day to psoriasis prn (Patient not taking: Reported on 01/17/2018) 60 g 5   ??? insulin ASPART (NOVOLOG) 100 unit/mL injection Inject under the skin Three (3) times a day before meals. 10 mL 12   ??? insulin glargine (LANTUS) 100 unit/mL injection Inject 80 Units under the skin nightly.     ??? lisinopril (PRINIVIL,ZESTRIL) 10 MG tablet Take 10 mg by mouth daily.     ??? methocarbamol (ROBAXIN) 500 MG tablet   0   ??? rosuvastatin calcium (CRESTOR ORAL) Take by mouth.     ??? sodium bicarbonate 650 mg tablet Take 1 tablet (650 mg total) by mouth Two (2) times a day. 60 tablet 11   ??? TRUEPLUS PEN NEEDLE 32 gauge x 5/32 Ndle   10     No current facility-administered medications for this visit.         Changes to medications: Amy [name] reports no changes reported at this time.    Allergies   Allergen Reactions   ??? Sulfur-8 Nausea Only   ??? Codeine    ??? Glucophage [Metformin]    ??? Morphine        Changes to allergies: No    Is the patient high risk? No     SPECIALTY MEDICATION ADHERENCE     [name] 30mg : 18 days of medicine on hand       Medication Adherence    Patient reported X missed doses in the last month:  0  Specialty Medication:  Otezla                      Specialty medication(s) dose(s) confirmed: Regimen is correct and unchanged.  Are there any concerns with adherence? No    Adherence counseling provided? Not needed    CLINICAL MANAGEMENT AND INTERVENTION      Clinical Benefit Assessment:    Do you feel the medicine is effective or helping your symptoms? somewhat - patient reports that it helps all areas except her elbows    Clinical Benefit counseling provided? patient has up-coming appointment and plans to discuss concerns with provider    Adverse Effects Assessment:    Are you experiencing any side effects? No    Are you experiencing difficulty administering your medicine? No    Quality of Life Assessment:    How many days over the past month did your condition  keep you from your normal activities? For example, brushing your teeth or getting up in the morning. 0    Have you discussed this with your provider? Not needed    Therapy Appropriateness:    Is therapy appropriate? Yes, therapy is appropriate and should be continued    DISEASE/MEDICATION-SPECIFIC INFORMATION        N/A    SHIPPING     Specialty Medication(s) to be Shipped:   Inflammatory Disorders: Otezla Other medication(s) to be shipped: n/a     Shipping address confirmed in Epic.     Changes to insurance: No    Delivery Scheduled: Yes, Expected medication delivery date: 1/17.     Medication will be delivered via Next Day Courier to the prescription address in [facility].    The patient will receive a drug information handout for each medication shipped and additional FDA Medication Guides as required.  Verified that patient has previously received a Conservation officer, historic buildings.    Clydell Hakim   [facility] Specialty Pharmacist

## 2018-05-28 ENCOUNTER — Encounter: Admit: 2018-05-28 | Discharge: 2018-05-29 | Payer: PRIVATE HEALTH INSURANCE

## 2018-05-28 ENCOUNTER — Ambulatory Visit: Admit: 2018-05-28 | Discharge: 2018-05-29 | Payer: PRIVATE HEALTH INSURANCE

## 2018-05-28 DIAGNOSIS — R928 Other abnormal and inconclusive findings on diagnostic imaging of breast: Principal | ICD-10-CM

## 2018-05-28 MED ORDER — PEG-ELECTROLYTE SOLUTION 420 GRAM ORAL SOLUTION
ORAL | 0 refills | 0.00000 days | Status: CP
Start: 2018-05-28 — End: 2018-05-29

## 2018-05-30 ENCOUNTER — Ambulatory Visit: Admit: 2018-05-30 | Discharge: 2018-05-31 | Payer: PRIVATE HEALTH INSURANCE

## 2018-05-30 ENCOUNTER — Encounter: Admit: 2018-05-30 | Discharge: 2018-05-31 | Payer: PRIVATE HEALTH INSURANCE

## 2018-05-30 DIAGNOSIS — R928 Other abnormal and inconclusive findings on diagnostic imaging of breast: Principal | ICD-10-CM

## 2018-05-30 NOTE — Unmapped (Signed)
Patient Name: Amy [name]  Patient Age: 58 y.o.  Encounter Date: 05/30/2018    Referring Physician:   Donzetta Matters, MD  [address]  [place], [place] [address]    Primary Care Provider:  Emogene Morgan, MD    Supervising Physician  Dr. Debbrah Alar      Reason for Visit:   Chief Complaint   Patient presents with   ??? New Diagnosis       HPI:    Amy [name] is a 58 y.o. female who is seen in consultation at the request of Aycock, Ngwe Achirimofo* for abnormal mammogram.  Patient had routine imaging studies which she had call back work-up on the right.  There was a 1 cm oval mass central right breast that appears to be stable since 2014.  Ultrasound of this region likely represents a complicated cyst, BI-RADS 4 a for which biopsy is recommended.  The patient has no first-degree relatives with breast cancer.  The patient has no breast complaints and has never had a breast biopsy.    Review of Systems:  Unremarkable for constitutional complaints in all 10 systems    Reproductive History:  Patient is postmenopausal since 1997.  She had a hysterectomy and bilateral salpingo-oophorectomy.  She used Premarin.  Menarche at 55    Medical History:  Past Medical History:   Diagnosis Date   ??? Alport syndrome    ??? Back pain    ??? Chronic kidney disease    ??? Diabetes mellitus (CMS-HCC)    ??? Hyperlipidemia    ??? Hypertension    ??? Kidney stone    ??? Psoriasis        Surgical History:  Past Surgical History:   Procedure Laterality Date   ??? CATARACT EXTRACTION Right 08/29/2017   ??? HYSTERECTOMY     ??? NEPHROSTOMY     ??? PR XCAPSL CTRC RMVL INSJ IO LENS PROSTH W/O ECP Right 08/29/2017    Procedure: EXTRACAPSULAR CATARACT REMOVAL W/INSERTION OF INTRAOCULAR LENS PROSTHESIS, MANUAL OR MECHANICAL TECHNIQUE Lens Choice:OD ZCB00 +19.50 [id number] +19.00/+18.00;  Surgeon: O'Rese Bonney Roussel, MD;  Location: [facility];  Service: Ophthalmology   ??? PR XCAPSL CTRC RMVL INSJ IO LENS PROSTH W/O ECP Left 09/19/2017 Procedure: EXTRACAPSULAR CATARACT REMOVAL W/INSERTION OF INTRAOCULAR LENS PROSTHESIS, MANUAL OR MECHANICAL TECHNIQUE Lens Choice: ZCB00 +21.00; ZA9003 +20.00/+19.00;  Surgeon: O'Rese Bonney Roussel, MD;  Location[facility];  Service: Ophthalmology       Family History:  Family History   Problem Relation Age of Onset   ??? Kidney disease Mother    ??? Diabetes Mother    ??? Cataracts Mother    ??? Heart attack Father    ??? Cancer Paternal Grandmother    ??? Breast cancer Neg Hx    ??? Colon cancer Neg Hx    ??? Endometrial cancer Neg Hx    ??? Ovarian cancer Neg Hx        Social History:  Tobacco use:   Social History     Tobacco Use   Smoking Status Current Every Day Smoker   ??? Packs/day: 1.00   Smokeless Tobacco Never Used     Alcohol use:   Social History     Substance and Sexual Activity   Alcohol Use No     Drug use:   Social History     Substance and Sexual Activity   Drug Use No     Living situation: And is single  she is employed as a Education administrator.  She is an every day smoker less than 1 pack/day since age 68.  And denies alcohol intake    Medications:     Current Outpatient Medications:   ???  apremilast 30 mg Tab, TAKE 1 TABLET BY MOUTH ONCE DAILY, Disp: 30 tablet, Rfl: 1  ???  aspirin (ECOTRIN) 81 MG tablet, Take 81 mg by mouth daily., Disp: , Rfl:   ???  betamethasone dipropionate (DIPROLENE) 0.05 % ointment, , Disp: , Rfl: 4  ???  calcipotriene (DOVONOX) 0.005 % cream, Apply 1 application topically Two (2) times a day., Disp: 60 g, Rfl: 1  ???  clobetasol (TEMOVATE) 0.05 % ointment, Apply to psoriasis rash 2 times daily as needed for rash. At night, apply medicine and then wrap in long sleeves or saran wrap, Disp: 60 g, Rfl: 5  ???  insulin ASPART (NOVOLOG) 100 unit/mL injection, Inject under the skin Three (3) times a day before meals., Disp: 10 mL, Rfl: 12  ???  insulin glargine (LANTUS) 100 unit/mL injection, Inject 80 Units under the skin nightly., Disp: , Rfl:   ???  lisinopril (PRINIVIL,ZESTRIL) 10 MG tablet, Take 10 mg by mouth daily., Disp: , Rfl:   ???  pregabalin (LYRICA) 50 MG capsule, Take 1 capsule by mouth 3 (three) times a day., Disp: , Rfl: 0  ???  rosuvastatin calcium (CRESTOR ORAL), Take by mouth., Disp: , Rfl:   ???  sodium bicarbonate 650 mg tablet, Take 1 tablet (650 mg total) by mouth Two (2) times a day., Disp: 60 tablet, Rfl: 11  ???  TRUEPLUS PEN NEEDLE 32 gauge x 5/32 Ndle, , Disp: , Rfl: 10  ???  amitriptyline (ELAVIL) 25 MG tablet, AMITRIPTYLINE HCL 25 MG TABS, Disp: , Rfl:   ???  apremilast (OTEZLA STARTER) 10 mg (4)-20 mg (4)-30 mg(19) DsPk tablet dosepak, Take as instructed-starter pack, Disp: 1 Package, Rfl: 0  ???  apremilast 10 mg (4)-20 mg (4)-30 mg (47) DsPk, TAKE 10 MG ONCE DAILY IN THE MORNING DAYS 1-3 20 MG ONCE DAILY IN THE MORNING DAYS 4-5 THEN 30 MG ONCE DAILY IN THE MORNING THEREAFTER (DON'T FOLLOW PACKAGE) (Patient not taking: Reported on 04/18/2018), Disp: 55 tablet, Rfl: 0  ???  calcipotriene (DOVONEX) 0.005 % cream, DOVONEX 0.005 % CREA, Disp: , Rfl:   ???  cephalexin (KEFLEX) 500 MG capsule, , Disp: , Rfl: 0  ???  cholecalciferol, vitamin D3, (VITAMIN D3) 1,000 unit capsule, Take 1,000 Units by mouth daily., Disp: , Rfl:   ???  halobetasol (ULTRAVATE) 0.05 % ointment, Apply topically twice a day to psoriasis prn (Patient not taking: Reported on 01/17/2018), Disp: 60 g, Rfl: 5  ???  methocarbamol (ROBAXIN) 500 MG tablet, , Disp: , Rfl: 0    Allergies:  is allergic to sulfur-8; codeine; glucophage [metformin]; and morphine.          Exam:  BSA: 1.81 meters squared  BP 111/61  - Pulse 71  - Temp 36.8 ??C (98.3 ??F) (Oral)  - Resp 17  - Ht 165.1 cm (5' 5)  - Wt 71.7 kg (158 lb)  - SpO2 98%  - BMI 26.29 kg/m??   Pain Assessment: Pain scale 0    General Appearance:  No acute distress, well appearing and well nourished.   Head:  Normocephalic, atraumatic.   Eyes:  Conjuctiva and lids appear normal. Pupils equal and round,   sclera anicteric.   Ears:  Overall appearance normal with no scars,  lesions or masses.  Hearing is grossly normal.   Nose: Nares grossly normal, no drainage.   Throat: Lips, mucosa, and tongue normal; teeth and gums normal.   Breast:  Breast exam has no skin change mass nipple discharge bilaterally.   Axilla  no adenopathy in the axilla bilaterally   Neck:  Neck is supple trachea midline no JVD supraclavicular adenopathy   Pulmonary:    Normal respiratory effort.  Lungs were clear to auscultation  bilaterally.   Cardiovascular:  Regular rate and rhythm, no murmur noted.  No thrill noted.   Musculoskeletal: Normal gait.  Extremities without clubbing, cyanosis, or           edema.   Psychiatric: Judgement and insight appropriate.  Oriented to person,         place,  and time.       Diagnostic Studies:  @MAMMOFINDINGS @  Per HPI    Assessment:  BI-RADS 4 a right mammogram    Plan:  The patient is assessed to be at average risk for the development of breast cancer.  Clinical exam is normal.  She will undergo ultrasound-guided biopsy today.  She will be called with her results and then additional recommendations to follow      The note was transcribed by dragon and may contain errors in spelling

## 2018-05-31 NOTE — Unmapped (Addendum)
Amy [name] 's OTEZLA shipment will be delayed due to Cost Increase We have contacted the patient and left a message We will call the patient to reschedule the delivery upon resolution. We have confirmed the delivery date as 1/17.

## 2018-06-01 MED FILL — OTEZLA 30 MG TABLET: 30 days supply | Qty: 30 | Fill #0 | Status: AC

## 2018-06-07 NOTE — Unmapped (Signed)
Called the patient today on the telephone with her breast core pathology results.  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  Mammogram conference also reviewed the case subsequently and recommended a six-month follow-up mammogram on the right.  This was all communicated to the patient will be as needed back in my clinic in her primary care doctor will be the ordering provider for imaging in the future

## 2018-06-19 ENCOUNTER — Encounter: Admit: 2018-06-19 | Discharge: 2018-06-19 | Payer: PRIVATE HEALTH INSURANCE

## 2018-06-21 NOTE — Unmapped (Signed)
[facility] Refill Coordination Note  Specialty Medication(s): [name] 30mg       Kasie Weldy, DOB: [birth year]  Phone: [phone] (home) , Alternate phone contact: N/A  Phone or address changes today?: No  All above HIPAA information was verified with patient.  Shipping Address: [address]  [place] [address]   Insurance changes? No    Completed refill call assessment today to schedule patient's medication shipment from the [facility] [phone]).      Confirmed the medication and dosage are correct and have not changed: Yes, regimen is correct and unchanged.    Confirmed patient started or stopped the following medications in the past month:  No, there are no changes reported at this time.    Are you tolerating your medication?:  Beautiful reports tolerating the medication.    ADHERENCE  Did you miss any doses in the past 4 weeks? No missed doses reported.    FINANCIAL/SHIPPING    Delivery Scheduled: Yes, Expected medication delivery date: 07/05/18     Medication will be delivered via Next Day Courier to the home address in [facility].    The patient will receive a drug information handout for each medication shipped and additional FDA Medication Guides as required.      Shanisha did not have any additional questions at this time.    We will follow up with patient monthly for standard refill processing and delivery.      Thank you,  Aulton Routt  Anders Grant   [facility] Specialty Pharmacist

## 2018-06-26 NOTE — Unmapped (Signed)
Spoke with Amy [name], 58 y.o., for treatment of tobacco use/dependence.    SUMMARY: Pt requested call back from tobacco treatment counselor through our automated call system. Pt has 40+ year smoking history and is interested in becoming tobacco-free due to her need for a kidney transplant. She has never tried to quit before and was receptive to learning about smoking cessation medication options. SW recommended combination NRT and pt plans to ask her primary provider for an rx for patches + lozenges. We also discussed behavioral strategies she can implement to help manage urges and triggers.    Outpatient/Discharge Medications Recommended: Patch 21mg , Lozenge 4mg     Tobacco Use Treatment  Program: Hospital Inpatient  Type of Visit: Initial  Tobacco Use Treatment Visit: Talked with patient  Goals Of Session: Insight, increase, Assessment, Relapse prevention skills training    Tobacco Use During Past 30 Days  Time Since Last Tobacco Use: smoked a cigarette today (at least one puff)  Type of Tobacco Products Used: Cigarettes  Quantity Used: 15  Quantity Per: day  Other Household Members Use Tobacco: Yes    Tobacco Use History  Age Began Use (Years Old): 16  Medications Used in Past Attempts: Bupropion, Varenicline  Side Effects: Found both to be ineffective      Behavioral Assessment  Why Uses: 1. Habit 2. Stress-relief   Reasons to Become Tobacco Free: Health (needs kidney transplant)   Barriers/Challenges: 1. Long-standing habit 2. Stress   Strategies: 1. In addition to NRT, pt can use drinking straws, hard candy, and toothpicks to assist with hand to mouth trigger 2. Pt can identify other activities she enjoys that help reduce stress      Treatment Plan  Outpatient/Discharge Medications Recommended: Patch 21mg , Lozenge 4mg   Plan to Obtain Outpatient Meds: (Pt will obtain rx from her PCP)  Referrals: ([facility])  Patient's Plan Post Discharge/Visit: Plan to quit as soon as possible       TTS Information  Diagnosis: Tobacco use disorder, unspecified, uncomplicated (F17.200)  Interventions: Assessed, Behavioral techniques, Discussed, Informed, Motivational interviewing, Taught, Suggested, Psycho-education, Tx plan development, Encouraged  TTS Visit Length: Psychotherapy >16 min       Sara Chu, LCSW, LCAS, NCTTP  Clinical Social Worker / Tobacco Treatment Specialist  Tobacco Treatment Program  [facility]  phone: [phone]  pager: [phone]

## 2018-07-04 MED FILL — OTEZLA 30 MG TABLET: ORAL | 30 days supply | Qty: 30 | Fill #1

## 2018-07-04 MED FILL — OTEZLA 30 MG TABLET: 30 days supply | Qty: 30 | Fill #1 | Status: AC

## 2018-07-23 NOTE — Unmapped (Deleted)
ASSESSMENT AND PLAN:  ***    Return to clinic: ***    CHIEF COMPLAINT:  ***    HPI:   This is a pleasant 58 y.o.-year-old who last saw Dr. Carles Collet on 01/17/2018.  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  She was significantly bothered by these.  She was started on [place] and at last visit had had improvement.  She returns today for follow-up.    Problem 1:   Location: ***  Duration: ***  Associated symptoms: ***  Modifying factors/treatments: ***    PAST MEDICAL HISTORY:  Diabetes, HTN, Alport's disease  No history of skin cancer  ??    MEDICATIONS:      Current Outpatient Medications on File Prior to Visit   Medication Sig Dispense Refill   ??? amitriptyline (ELAVIL) 25 MG tablet AMITRIPTYLINE HCL 25 MG TABS     ??? apremilast (OTEZLA STARTER) 10 mg (4)-20 mg (4)-30 mg(19) DsPk tablet dosepak Take as instructed-starter pack 1 Package 0   ??? apremilast 10 mg (4)-20 mg (4)-30 mg (47) DsPk TAKE 10 MG ONCE DAILY IN THE MORNING DAYS 1-3 20 MG ONCE DAILY IN THE MORNING DAYS 4-5 THEN 30 MG ONCE DAILY IN THE MORNING THEREAFTER (DON'T FOLLOW PACKAGE) (Patient not taking: Reported on 04/18/2018) 55 tablet 0   ??? apremilast 30 mg Tab TAKE 1 TABLET BY MOUTH ONCE DAILY 30 tablet 1   ??? aspirin (ECOTRIN) 81 MG tablet Take 81 mg by mouth daily.     ??? betamethasone dipropionate (DIPROLENE) 0.05 % ointment   4   ??? calcipotriene (DOVONEX) 0.005 % cream DOVONEX 0.005 % CREA     ??? calcipotriene (DOVONOX) 0.005 % cream Apply 1 application topically Two (2) times a day. 60 g 1   ??? cephalexin (KEFLEX) 500 MG capsule   0   ??? cholecalciferol, vitamin D3, (VITAMIN D3) 1,000 unit capsule Take 1,000 Units by mouth daily.     ??? clobetasol (TEMOVATE) 0.05 % ointment Apply to psoriasis rash 2 times daily as needed for rash. At night, apply medicine and then wrap in long sleeves or saran wrap 60 g 5   ??? halobetasol (ULTRAVATE) 0.05 % ointment Apply topically twice a day to psoriasis prn (Patient not taking: Reported on 01/17/2018) 60 g 5   ??? insulin ASPART (NOVOLOG) 100 unit/mL injection Inject under the skin Three (3) times a day before meals. 10 mL 12   ??? insulin glargine (LANTUS) 100 unit/mL injection Inject 80 Units under the skin nightly.     ??? lisinopril (PRINIVIL,ZESTRIL) 10 MG tablet Take 10 mg by mouth daily.     ??? methocarbamol (ROBAXIN) 500 MG tablet   0   ??? pregabalin (LYRICA) 50 MG capsule Take 1 capsule by mouth 3 (three) times a day.  0   ??? rosuvastatin calcium (CRESTOR ORAL) Take by mouth.     ??? sodium bicarbonate 650 mg tablet Take 1 tablet (650 mg total) by mouth Two (2) times a day. 60 tablet 11   ??? TRUEPLUS PEN NEEDLE 32 gauge x 5/32 Ndle   10     No current facility-administered medications on file prior to visit.        ALLERGIES:   Sulfur-8; Codeine; Glucophage [metformin]; and Morphine    SOCIAL HISTORY:  ***    FAMILY HISTORY:  ***    REVIEW OF SYSTEMS:  Baseline state of health. No recent illnesses. No other skin complaints.  PHYSICAL EXAMINATION:  Examination in the presence of female chaperone:  General: Well-developed, well-nourished. No acute distress. Neuro: Alert and oriented, answers questions appropriately.  Skin: Examination of the scalp, face, neck, chest, abdomen, back, bilateral upper extremities, bilateral lower extremities, palms, nails, and soles was performed and notable for the following:  ***

## 2018-07-26 DIAGNOSIS — L409 Psoriasis, unspecified: Principal | ICD-10-CM

## 2018-07-26 NOTE — Unmapped (Signed)
Amy [name] 's [name] shipment will be delayed due to No refills and the request was denied. We have contacted the patient and communicated the delivery change to patient/caregiver We will call the patient to reschedule the delivery upon resolution. We have confirmed the delivery date as NA .

## 2018-07-26 NOTE — Unmapped (Signed)
[facility] Refill Coordination Note    Patient stated that she does not feel that [name] is working for her. States that she has seen little to no improvement.    Specialty Medication(s) to be Shipped:   Inflammatory Disorders: Otezla    Other medication(s) to be shipped: Amy [name], DOB: [birth year]  Phone: [phone] (home)       All above HIPAA information was verified with patient.     Completed refill call assessment today to schedule patient's medication shipment from the [facility] [phone]).       Specialty medication(s) and dose(s) confirmed: Regimen is correct and unchanged.   Changes to medications: Amy [name] reports no changes reported at this time.  Changes to insurance: No  Questions for the pharmacist: No    Confirmed patient received Welcome Packet with Amy shipment. The patient will receive a drug information handout for each medication shipped and additional FDA Medication Guides as required.       DISEASE/MEDICATION-SPECIFIC INFORMATION        N/A    SPECIALTY MEDICATION ADHERENCE     Medication Adherence    Patient reported X missed doses in the last month:  0  Specialty Medication:  otezla  Patient is on additional specialty medications:  No  Patient is on more than two specialty medications:  No  Any gaps in refill history greater than 2 weeks in the last 3 months:  no  Demonstrates understanding of importance of adherence:  yes  Informant:  patient  Reliability of informant:  reliable  Confirmed plan for next specialty medication refill:  delivery by pharmacy  Refills needed for supportive medications:  not needed          Refill Coordination    Has the Patients' Contact Information Changed:  No  Is the Shipping Address Different:  No           otezla 30 mg tabs. Patient has 7 days of medication      SHIPPING     Shipping address confirmed in Epic.     Delivery Scheduled: Yes, Expected medication delivery date: 032020.     Medication will be delivered via Next Day Courier to the prescription address in Epic [facility].    Amy [name]   [facility] Specialty Technician

## 2018-07-26 NOTE — Unmapped (Signed)
Last seen September 2019. Requesting refill of Apremilast.

## 2018-07-28 MED ORDER — APREMILAST 30 MG TABLET
ORAL_TABLET | ORAL | 1 refills | 0.00000 days | Status: CP
Start: 2018-07-28 — End: 2018-09-18
  Filled 2018-07-30: qty 30, 30d supply, fill #0

## 2018-07-30 MED FILL — OTEZLA 30 MG TABLET: 30 days supply | Qty: 30 | Fill #0 | Status: AC

## 2018-07-30 NOTE — Unmapped (Signed)
Amy [name] 's OTEZLA shipment will be delayed due to No refills We have contacted the patient and communicated the delivery change to patient/caregiver We will reschedule the medication for the delivery date that the patient agreed upon. We have confirmed the delivery date as 07/31/2018 .

## 2018-08-22 NOTE — Unmapped (Signed)
[facility] Refill Coordination Note    Specialty Medication(s) to be Shipped:   Inflammatory Disorders: Otezla    Other medication(s) to be shipped: [employer], DOB: December 01, 1960  Phone: [phone] (home)       All above HIPAA information was verified with patient.     Completed refill call assessment today to schedule patient's medication shipment from the [facility] [phone]).       Specialty medication(s) and dose(s) confirmed: Regimen is correct and unchanged.   Changes to medications: Shawni reports no changes reported at this time.  Changes to insurance: No  Questions for the pharmacist: No    Confirmed patient received Welcome Packet with first shipment. The patient will receive a drug information handout for each medication shipped and additional FDA Medication Guides as required.       DISEASE/MEDICATION-SPECIFIC INFORMATION        N/A    SPECIALTY MEDICATION ADHERENCE     Medication Adherence    Patient reported X missed doses in the last month:  0  Specialty Medication:  otezla  Patient is on additional specialty medications:  No  Patient is on more than two specialty medications:  No  Any gaps in refill history greater than 2 weeks in the last 3 months:  no  Demonstrates understanding of importance of adherence:  yes  Informant:  patient  Reliability of informant:  reliable  Confirmed plan for next specialty medication refill:  delivery by pharmacy  Refills needed for supportive medications:  not needed          Refill Coordination    Has the Patients' Contact Information Changed:  No  Is the Shipping Address Different:  No           Otezla 30 mg. Patient has 7 days supply remaining      SHIPPING     Shipping address confirmed in Epic.     Delivery Scheduled: Yes, Expected medication delivery date: 041520.     Medication will be delivered via Next Day Courier to the prescription address in Epic [facility].    Jamy Cleckler D Fany Cavanaugh   [facility] Specialty Technician

## 2018-08-27 MED FILL — OTEZLA 30 MG TABLET: ORAL | 30 days supply | Qty: 30 | Fill #1

## 2018-08-27 MED FILL — OTEZLA 30 MG TABLET: 30 days supply | Qty: 30 | Fill #1 | Status: AC

## 2018-09-04 NOTE — Unmapped (Signed)
Attempt to reach patient /Lmg of closing & cancellation with option of reschedule appointment as a VV or phone visit

## 2018-09-06 NOTE — Unmapped (Signed)
I spoke with patient she declined VV and phone. She would rather wait for now for reopen

## 2018-09-18 MED ORDER — APREMILAST 30 MG TABLET
ORAL_TABLET | ORAL | 1 refills | 0.00000 days | Status: CP
Start: 2018-09-18 — End: 2019-01-02

## 2018-09-18 NOTE — Unmapped (Signed)
[facility] Refill Coordination Note    Specialty Medication(s) to be Shipped:   Inflammatory Disorders: Otezla    Other medication(s) to be shipped: [employer], DOB: May 11, 1961  Phone: [phone] (home)       All above HIPAA information was verified with patient.     Completed refill call assessment today to schedule patient's medication shipment from the [facility] [phone]).       Specialty medication(s) and dose(s) confirmed: Regimen is correct and unchanged.   Changes to medications: Chade reports no changes at this time.  Changes to insurance: No  Questions for the pharmacist: No    Confirmed patient received Welcome Packet with first shipment. The patient will receive a drug information handout for each medication shipped and additional FDA Medication Guides as required.       DISEASE/MEDICATION-SPECIFIC INFORMATION        N/A    SPECIALTY MEDICATION ADHERENCE     Medication Adherence    Patient reported X missed doses in the last month:  0  Specialty Medication:  Otezla 30 mg  Patient is on additional specialty medications:  No  Patient is on more than two specialty medications:  No  Any gaps in refill history greater than 2 weeks in the last 3 months:  no  Demonstrates understanding of importance of adherence:  yes  Informant:  patient  Reliability of informant:  reliable  Confirmed plan for next specialty medication refill:  delivery by pharmacy  Refills needed for supportive medications:  not needed          Refill Coordination    Has the Patients' Contact Information Changed:  No  Is the Shipping Address Different:  No           Otezla 30 mg tablets. Patient states she has about 2 weeks remaining      SHIPPING     Shipping address confirmed in Epic.     Delivery Scheduled: Yes, Expected medication delivery date: 051220.     Medication will be delivered via Next Day Courier to the prescription address in Epic WAM.    Alivea Gladson D Quashawn Jewkes   [facility] Shared [facility] Specialty Technician

## 2018-09-20 MED ORDER — APREMILAST 30 MG TABLET
ORAL_TABLET | ORAL | 1 refills | 0 days | Status: CP
Start: 2018-09-20 — End: 2018-11-23
  Filled 2018-09-24: qty 30, 30d supply, fill #0

## 2018-09-24 MED FILL — OTEZLA 30 MG TABLET: 30 days supply | Qty: 30 | Fill #0 | Status: AC

## 2018-09-26 ENCOUNTER — Encounter: Admit: 2018-09-26 | Discharge: 2018-09-27 | Payer: PRIVATE HEALTH INSURANCE

## 2018-09-26 DIAGNOSIS — E1165 Type 2 diabetes mellitus with hyperglycemia: Secondary | ICD-10-CM

## 2018-09-26 DIAGNOSIS — E559 Vitamin D deficiency, unspecified: Secondary | ICD-10-CM

## 2018-09-26 DIAGNOSIS — Z794 Long term (current) use of insulin: Secondary | ICD-10-CM

## 2018-09-26 DIAGNOSIS — N184 Chronic kidney disease, stage 4 (severe): Secondary | ICD-10-CM

## 2018-09-26 DIAGNOSIS — D631 Anemia in chronic kidney disease: Secondary | ICD-10-CM

## 2018-09-26 DIAGNOSIS — E1122 Type 2 diabetes mellitus with diabetic chronic kidney disease: Secondary | ICD-10-CM

## 2018-09-26 DIAGNOSIS — Q8781 Alport syndrome: Secondary | ICD-10-CM

## 2018-09-26 NOTE — Unmapped (Signed)
PCP:  Emogene Morgan, MD    09/26/2018  [facility] [address] [place]  [address]  [place] [address]  Dept: [phone]  Loc: [phone]    Chief Complaint: Alport syndrome, elevated creatinine    HPI:  [name] is a 58 y.o. 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 Her mother also had renal disease but it is unknown if this was due to Alports.     She has uncontrolled type 2 diabetes and is not checking her glucose regularly - her A1c was 8.4% in September. She does have a history of diabetic neuropathy but no known history of diabetic retinopathy. She did suffer from kidney stones in 1998 however has not had any issues with this since.    She was referred to [facility] for transplant but was denied due to financial concerns. They recommended we refer her to [facility].     She feels ok today, but does note some back pain that radiates to her left leg. She feels this is a pinched nerve. She knows to avoid NSAIDS.     ROS:   CONSTITUTIONAL: denies fevers or chills, denies unintentional weight loss   CARDIOVASCULAR: denies chest pain, denies dyspnea on exertion, denies leg edema  GASTROINTESTINAL: denies nausea, denies vomiting, denies anorexia  GENITOURINARY: denies dysuria, denies hematuria, denies decreased urinary stream  All 10 systems reviewed and are negative except as listed above.    PAST MEDICAL HISTORY:  Past Medical History:   Diagnosis Date   ??? Alport syndrome    ??? Back pain    ??? Chronic kidney disease    ??? Diabetes mellitus (CMS-HCC)    ??? Hyperlipidemia    ??? Hypertension    ??? Kidney stone    ??? Psoriasis        ALLERGIES  Sulfur-8; Codeine; Glucophage [metformin]; and Morphine    FAMILY HISTORY  Family History   Problem Relation Age of Onset   ??? Kidney disease Mother    ??? Diabetes Mother    ??? Cataracts Mother    ??? Heart attack Father    ??? Cancer Paternal Grandmother    ??? Breast cancer Neg Hx    ??? Colon cancer Neg Hx    ??? Endometrial cancer Neg Hx    ??? Ovarian cancer Neg Hx        SOCIAL HISTORY   reports that she has been smoking. She has been smoking about 1.00 pack per day. She has never used smokeless tobacco. She reports that she does not drink alcohol or use drugs.    MEDICATIONS:  Current Outpatient Medications   Medication Sig Dispense Refill   ??? amitriptyline (ELAVIL) 25 MG tablet AMITRIPTYLINE HCL 25 MG TABS     ??? apremilast 30 mg Tab TAKE 1 TABLET BY MOUTH ONCE DAILY 30 tablet 1   ??? apremilast 30 mg Tab TAKE 1 TABLET BY MOUTH ONCE DAILY 30 tablet 1   ??? aspirin (ECOTRIN) 81 MG tablet Take 81 mg by mouth daily.     ??? betamethasone dipropionate (DIPROLENE) 0.05 % ointment   4   ??? calcipotriene (DOVONEX) 0.005 % cream DOVONEX 0.005 % CREA     ??? calcipotriene (DOVONOX) 0.005 % cream Apply 1 application topically Two (2) times a day. 60 g 1   ??? cephalexin (KEFLEX) 500 MG capsule   0   ??? cholecalciferol, vitamin  D3, (VITAMIN D3) 1,000 unit capsule Take 1,000 Units by mouth daily.     ??? clobetasol (TEMOVATE) 0.05 % ointment Apply to psoriasis rash 2 times daily as needed for rash. At night, apply medicine and then wrap in long sleeves or saran wrap 60 g 5   ??? halobetasol (ULTRAVATE) 0.05 % ointment Apply topically twice a day to psoriasis prn (Patient not taking: Reported on 01/17/2018) 60 g 5   ??? insulin ASPART (NOVOLOG) 100 unit/mL injection Inject under the skin Three (3) times a day before meals. 10 mL 12   ??? insulin glargine (LANTUS) 100 unit/mL injection Inject 80 Units under the skin nightly.     ??? lisinopril (PRINIVIL,ZESTRIL) 10 MG tablet Take 10 mg by mouth daily.     ??? methocarbamol (ROBAXIN) 500 MG tablet   0   ??? pregabalin (LYRICA) 50 MG capsule Take 1 capsule by mouth 3 (three) times a day.  0   ??? rosuvastatin calcium (CRESTOR ORAL) Take by mouth.     ??? sodium bicarbonate 650 mg tablet Take 1 tablet (650 mg total) by mouth Two (2) times a day. 60 tablet 11   ??? TRUEPLUS PEN NEEDLE 32 gauge x 5/32 Ndle   10     No current facility-administered medications for this visit.        PHYSICAL EXAM:  Wt Readings from Last 3 Encounters:   06/19/18 71.7 kg (158 lb)   05/30/18 71.7 kg (158 lb)   04/18/18 70.3 kg (154 lb 14.4 oz)     Temp Readings from Last 3 Encounters:   06/19/18 36.9 ??C (98.5 ??F) (Temporal)   05/30/18 36.8 ??C (98.3 ??F) (Oral)   09/19/17 36.4 ??C (97.5 ??F) (Temporal)     BP Readings from Last 5 Encounters:   06/19/18 88/56   05/30/18 111/61   04/18/18 128/74   10/16/17 119/71   09/19/17 109/60     Pulse Readings from Last 3 Encounters:   06/19/18 71   05/30/18 71   04/18/18 75     Telephone encounter, no PE performed    Prior physical exam    CONSTITUTIONAL: Alert,well appearing, no distress  HEENT: Moist mucous membranes, oropharynx clear without erythema or exudate  EYES: Extra ocular movements intact. Pupils reactive, sclerae anicteric.  NECK: Supple, no lymphadenopathy  CARDIOVASCULAR: Regular, normal S1/S2 heart sounds, no murmurs, no rubs.   PULM: Clear to auscultation bilaterally  GASTROINTESTINAL: Soft, active bowel sounds, nontender  EXTREMITIES: No lower extremity edema bilaterally.   SKIN: No rashes or lesions  NEUROLOGIC: No focal motor or sensory deficits    MEDICAL DECISION MAKING    01/24/2018  HbA1c 8.4%  Ferritin 120  B12 981, folic acid 11.8  Hgb 12.9, HCT 38.8  Glucose 95, BUN 46, CR 2.51, EGFR 21  NA 143, K5.0, CL 109, CO2 17, CA 9.7, ALB 4.1    03/28/17  HbA1c 8.2%, cholesterol 275, triglyceride 471  GLU 187, BUN 37, CR 2.12, EGFR 26, NA 139, K5.0, CL 105, CO2 19, CA 9.2    03/03/09  sodium 140, potassium 5.1, chloride 109, BUN 34, creatinine 1.82 with eGFR 30, glucose 99, albumin 4.2  Total cholesterol 251, triglycerides 340 and LDL 146.  WBC 8.3, hemoglobin 14.3, hematocrit 43.9 and platelets 181,000.  Hemoglobin A1c 6.9%.    Renal U/S showed right kidney 9.4cm and left 9.9cm.    Two simple cysts in the left kidney in the mid and lower poles measuring  1.3 and 0.9cm in  diameter.     Creatinine   Date Value Ref Range Status   10/16/2017 2.24 (H) 0.60 - 1.00 mg/dL Final   60/45/4098 1.19 (H) 0.60 - 1.00 mg/dL Final   14/78/2956 2.13 (H) 0.60 - 1.00 mg/dL Final        Lab Results   Component Value Date    NA 140 10/16/2017    K 4.8 10/16/2017    CL 109 (H) 10/16/2017    CO2 20.0 (L) 10/16/2017    BUN 41 (H) 10/16/2017    CREATININE 2.24 (H) 10/16/2017    GFRAA 28 (L) 10/16/2017    GFRNONAA 23 (L) 10/16/2017    GLU 180 (H) 10/16/2017    CALCIUM 9.2 10/16/2017    PHOS 3.4 10/16/2017       Lab Results   Component Value Date    CALCIUM 9.2 10/16/2017       No results found for: BILITOT, BILIDIR, PROT, ALBUMIN, ALT, AST, ALKPHOS, GGT  No results found for: LABPROT, INR, APTT    HGB   Date Value Ref Range Status   09/27/2016 13.9 12.0 - 16.0 g/dL Final   08/65/7846 96.2 12.0 - 16.0 g/dL Final        Lab Results   Component Value Date    HGB 13.9 09/27/2016     No results found for: IRON, TIBC, FERRITIN    No results found for: A1C    No results found for: COLORU, LABSPEC, LABPH, PROTEINUR, GLUCOSEU, KETONESU, BLOODU, NITRITE]    No results found for: PROTEINUR, CREATUR  No results found for: PCRATIOUR    No results found for: CHOL, C3, C4, ANCA, A1C, ANA, HEPAIGG, HEPAIGM, HEPBIGM, HEPBCAB, HEPBSAG, HEPCAB, HIV12AB      03/03/09, sodium 140, potassium 5.1, chloride  109, BUN 34, creatinine 1.82 with eGFR 30, glucose 99 and albumin  4.2.  ??  Total cholesterol 251, triglycerides 340 and LDL 146.  ??  WBC 8.3, hemoglobin 14.3, hematocrit 43.9 and platelets 181,000.      04/29/08  BUN was 44, creatinine 1.86.   Sodium 141, potassium 4.9, calcium 9.4, phosphorus 4.1, albumin  4.0, AST 17, ALT 14, total cholesterol 231, triglycerides 297,  HDL 37, LDL 135.  Hemoglobin 13.8, TSH 1.48.  A1c was 7.5.    12/24/2007   glucose of 131, BUN of 34,   creatinine of 1.79 with an estimated GFR 31, potassium of 4.9, bicarbonate  of 19, calcium of 9.4, phosphorous of 3.9, albumin of 4.0, and hemoglobin  A1c of 7.3.  ??  A renal ultrasound done on 10/10/2007 showed mildly increased echotexture  of bilateral renal cortices, with the right kidney measuring 9.4  x 4.0 x 4.7 cm, and the left kidney measuring 9.4 x 4.2 x 4.2 cm,  with no hydronephrosis.  There are simple-appearing cysts in the  mid to lower pole of the left kidney.        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    Comment:  There is no evidence of a glomerulonephritis. ??Electron microscopy shows marked  thinning of the lamina densa along glomerular basement membranes associated with  an abnormal immunofluorescence staining pattern for collagen type 4 alpha 5 and  to a lesser degree of 3 subunits (mosaic staining). 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 ??The nephropathy  has resulted in thinning of peripheral glomerular  capillary walls and glomerular as well tubulointerstitial fibrosis.    ASSESSMENT/PLAN:    Ms.Amy [name] is a 58 y.o. patient with a past medical history significant for DM2, Alport Being seen for reevaluation of her renal disease.     1. 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 Her creatinine is worse at 2.5. We will monitor every 4-6 months. We are attempting to get her latest labs from Dr. Jerrilyn [place] office.     2. CKD stage 4 -  I do recommend continuing the lisinopril and emphasizing the importance of hypertension and glucose control. Her renal biopsy did have a degree of interstitial fibrosis and glomerular sclerosis which are likely indicative of damage from these other conditions as well. The thinning of the basement membrane is likely due to the Alport syndrome.    - We briefly discussed transplant again today. She is unhappy that [facility] rejected her due to concerns about her finances. She was told she would need ~$7000 to undergo work up at this time. She states they told her [facility] might be a more appropriate transplant program for her.   - We briefly discussed dialysis. She would be a good home therapy candidate. There is no indication for dialysis at this time, she is not uremic.     3. DM2 - Defer to PCP for management. Metformin contraindicated for EGFR less than 30.    4. Hypertension - currently, this is well-controlled with lisinopril. At this time, I do not recommend any changes to her medication regimen that I would continue her on the ACE inhibitor for renal protection as long as feasible.     5. Acidosis - on NaBicarb 650mg  BID today    Ms.Amy [name] will follow up in 4 month(s) with a BMP, Hgb, Phos, vitamin D, PTH      Disclaimer: Much of the narrative of this dictation was acquired using speech recognition software. It is possible that some dictated speech was not transcribed accurately by this system nor detected in the proofing. Such errors are at times unavoidable.    I spent 14 minutes on the phone with the patient. I spent an additional 10 minutes on pre- and post-visit activities.     The patient was physically located in [place] or a state in which I am permitted to provide care. The patient and/or parent/gauardian understood that s/he may incur co-pays and cost sharing, and agreed to the telemedicine visit. The visit was completed via phone and/or video, which was appropriate and reasonable under the circumstances given the patient's presentation at the time.    The patient and/or parent/guardian has been advised of the potential risks and limitations of this mode of treatment (including, but not limited to, the absence of in-person examination) and has agreed to be treated using telemedicine. The patient's/patient's family's questions regarding telemedicine have been answered.     If the phone/video visit was completed in an ambulatory setting, the patient and/or parent/guardian has also been advised to contact their provider???s office for worsening conditions, and seek emergency medical treatment and/or call 911 if the patient deems either necessary.

## 2018-10-30 NOTE — Unmapped (Signed)
I spoke with Shakerria about her [place]. Unfortunately, she still has psoriasis on her legs and elbows, and she would like to consider a different agent.     [facility] Clinical Assessment & Refill Coordination Note    Amy [name], DOB: [birth year]  Phone: [phone] (home)     All above HIPAA information was verified with patient.     Specialty Medication(s):   Inflammatory Disorders: [name]     Current Outpatient Medications   Medication Sig Dispense Refill   ??? amitriptyline (ELAVIL) 25 MG tablet AMITRIPTYLINE HCL 25 MG TABS     ??? apremilast 30 mg Tab TAKE 1 TABLET BY MOUTH ONCE DAILY 30 tablet 1   ??? apremilast 30 mg Tab TAKE 1 TABLET BY MOUTH ONCE DAILY 30 tablet 1   ??? aspirin (ECOTRIN) 81 MG tablet Take 81 mg by mouth daily.     ??? betamethasone dipropionate (DIPROLENE) 0.05 % ointment   4   ??? calcipotriene (DOVONEX) 0.005 % cream DOVONEX 0.005 % CREA     ??? calcipotriene (DOVONOX) 0.005 % cream Apply 1 application topically Two (2) times a day. 60 g 1   ??? cephalexin (KEFLEX) 500 MG capsule   0   ??? cholecalciferol, vitamin D3, (VITAMIN D3) 1,000 unit capsule Take 1,000 Units by mouth daily.     ??? clobetasol (TEMOVATE) 0.05 % ointment Apply to psoriasis rash 2 times daily as needed for rash. At night, apply medicine and then wrap in long sleeves or saran wrap 60 g 5   ??? halobetasol (ULTRAVATE) 0.05 % ointment Apply topically twice a day to psoriasis prn (Patient not taking: Reported on 01/17/2018) 60 g 5   ??? insulin ASPART (NOVOLOG) 100 unit/mL injection Inject under the skin Three (3) times a day before meals. 10 mL 12   ??? insulin glargine (LANTUS) 100 unit/mL injection Inject 80 Units under the skin nightly.     ??? lisinopril (PRINIVIL,ZESTRIL) 10 MG tablet Take 10 mg by mouth daily.     ??? methocarbamol (ROBAXIN) 500 MG tablet   0   ??? pregabalin (LYRICA) 50 MG capsule Take 1 capsule by mouth 3 (three) times a day.  0   ??? rosuvastatin calcium (CRESTOR ORAL) Take by mouth.     ??? sodium bicarbonate 650 mg tablet Take 1 tablet (650 mg total) by mouth Two (2) times a day. 60 tablet 11   ??? TRUEPLUS PEN NEEDLE 32 gauge x 5/32 Ndle   10     No current facility-administered medications for this visit.         Changes to medications: Kenisha reports no changes at this time.    Allergies   Allergen Reactions   ??? Sulfur-8 Nausea Only   ??? Codeine    ??? Glucophage [Metformin]    ??? Morphine        Changes to allergies: No    SPECIALTY MEDICATION ADHERENCE     Otezla ~ 5 days left         Specialty medication(s) dose(s) confirmed: Regimen is correct and unchanged.     Are there any concerns with adherence? No    Adherence counseling provided? Not needed    CLINICAL MANAGEMENT AND INTERVENTION      Clinical Benefit Assessment:    Do you feel the medicine is effective or helping your condition? No    Clinical Benefit counseling provided? consulted provider regarding clinical benefit concerns    Adverse Effects Assessment:  Are you experiencing any side effects? No    Are you experiencing difficulty administering your medicine? No    Quality of Life Assessment:    How many days over the past month did your psoriasis  keep you from your normal activities? For example, brushing your teeth or getting up in the morning. 0    Have you discussed this with your provider? Not needed    Therapy Appropriateness:    Is therapy appropriate? Pharmacist will consult provider - not seeing benefit - has appt next month    DISEASE/MEDICATION-SPECIFIC INFORMATION      N/A    PATIENT SPECIFIC NEEDS     ? Does the patient have any physical, cognitive, or cultural barriers? No    ? Is the patient high risk? No     ? Does the patient require a Care Management Plan? No     ? Does the patient require physician intervention or other additional services (i.e. nutrition, smoking cessation, social work)? No      SHIPPING     Specialty Medication(s) to be Shipped:   Inflammatory Disorders: Otezla    Other medication(s) to be shipped: na Changes to insurance: No    Delivery Scheduled: Yes, Expected medication delivery date: Thurs, June 18.     Medication will be delivered via UPS to the confirmed home address in [facility].    The patient will receive a drug information handout for each medication shipped and additional FDA Medication Guides as required.  Verified that patient has previously received a Conservation officer, historic buildings.    All of the patient's questions and concerns have been addressed.    Lanney Gins   [facility] Specialty Pharmacist

## 2018-10-31 MED FILL — OTEZLA 30 MG TABLET: 30 days supply | Qty: 30 | Fill #1 | Status: AC

## 2018-10-31 MED FILL — OTEZLA 30 MG TABLET: ORAL | 30 days supply | Qty: 30 | Fill #1

## 2018-11-23 NOTE — Unmapped (Signed)
[facility] Refill Coordination Note    Specialty Medication(s) to be Shipped:   Inflammatory Disorders: Otezla    Other medication(s) to be shipped: [employer], DOB: 08/31/1960  Phone: [phone] (home)       All above HIPAA information was verified with patient.     Completed refill call assessment today to schedule patient's medication shipment from the [facility] [phone]).       Specialty medication(s) and dose(s) confirmed: Regimen is correct and unchanged.   Changes to medications: Tiffine reports no changes at this time.  Changes to insurance: No  Questions for the pharmacist: No    Confirmed patient received Welcome Packet with first shipment. The patient will receive a drug information handout for each medication shipped and additional FDA Medication Guides as required.       DISEASE/MEDICATION-SPECIFIC INFORMATION        N/A    SPECIALTY MEDICATION ADHERENCE     Medication Adherence    Patient reported X missed doses in the last month:  0  Specialty Medication:  [name]  Patient is on additional specialty medications:  No  Patient is on more than two specialty medications:  No  Any gaps in refill history greater than 2 weeks in the last 3 months:  no  Demonstrates understanding of importance of adherence:  yes  Informant:  patient  Reliability of informant:  reliable  Confirmed plan for next specialty medication refill:  delivery by pharmacy  Refills needed for supportive medications:  not needed                Otezla. 7 days supply remaining      SHIPPING     Shipping address confirmed in Epic.     Delivery Scheduled: Yes, Expected medication delivery date: 071720.  However, Rx request for refills was sent to the provider as there are none remaining.     Medication will be delivered via Next Day Courier to the prescription address in Epic W[facility].    Gaetana Kawahara D Kimmy Totten   [facility] Specialty Technician

## 2018-11-23 NOTE — Unmapped (Signed)
Last seen September 2019. Has appointment scheduled in 2 weeks. Requesting apremilast refill. One months supply pended.

## 2018-11-24 MED ORDER — APREMILAST 30 MG TABLET
ORAL_TABLET | ORAL | 0 refills | 30 days | Status: CP
Start: 2018-11-24 — End: 2018-12-29
  Filled 2018-11-29: qty 30, 30d supply, fill #0

## 2018-11-29 MED FILL — OTEZLA 30 MG TABLET: 30 days supply | Qty: 30 | Fill #0 | Status: AC

## 2018-12-03 NOTE — Unmapped (Signed)
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  -Punch biopsy of representative lesion on left elbow taken to ensure that this does look like psoriasis.  Particularly, she is interested in trying to be more aggressive in her treatment and I would like to make sure we have the correct diagnosis prior to doing so.  -Biopsy of lesion(s) in question  was performed in typical fashion using 2% lidocaine with epinephrine and nylon suture for hemostasis.  -I counseled with her the risks and benefits of topical steroids.  She may have some skin effect from chronic steroid use on her elbows (bruising, telangiectasia) and so we talked about appropriate use.    Return to clinic: 3 months.     CHIEF COMPLAINT:  Follow-up psoriasis    HPI:   This is a pleasant 58 y.o.-year-old who last saw Dr. Carles Collet for plaque psoriasis with limited involvement that is been recalcitrant to topical clobetasol.  At last visit she was continued on [place].  She has a history of kidney disease and therefore we were avoiding methotrexate.    She comes in today and continues to have symptoms of her psoriasis, particularly on her elbows, which never fully resolves.  This is associated with mild to moderate itch.  She is continuing on [place] but wonders if there might be something better.    PAST MEDICAL HISTORY:  Diabetes  HTN  Alport's disease  No history of skin cancer    MEDICATIONS:   Reviewed in Epic.     ALLERGIES:   Sulfur-8, Codeine, Glucophage [metformin], and Morphine    REVIEW OF SYSTEMS:  Baseline state of health. No recent illnesses. No other skin complaints.    PHYSICAL EXAMINATION:  Examination in the presence of female chaperone:  General: Well-developed, well-nourished. No acute distress. Neuro: Alert and oriented, answers questions appropriately.  Skin: Examination of the scalp, face, neck, chest, back, bilateral upper extremities nails, and soles was performed and notable for the following:  Scaly papules on elbows, some slightly larger/broader  Remainder of skin essentially clear  Bruising and telangiectasia on areas of topical steroid use on elbows

## 2018-12-04 ENCOUNTER — Ambulatory Visit: Admit: 2018-12-04 | Discharge: 2018-12-05 | Payer: PRIVATE HEALTH INSURANCE

## 2018-12-04 DIAGNOSIS — R21 Rash and other nonspecific skin eruption: Principal | ICD-10-CM

## 2018-12-04 NOTE — Unmapped (Addendum)
Punch biopsy  Punch biopsy involves numbing a small area of your skin, then obtaining a sample to help [place] make a proper diagnosis of your skin condition. The biopsy site is typically closed with one to 3 small stitches to help the site heal. Biopsy results will usually return in 7-14 days.    To care for the area: Leave the bandage in place until the morning after your procedure is performed. On a daily basis, carefully remove the bandage, then shower or wash as usual. Allow water to run over the site. Please do not scrub. Carefully dry the area, then apply ointment (some people develop an allergy to Neosporin, so we recommend Vaseline or Aquaphor). Cover the site with a fresh bandage. Should any bleeding occur, apply firm pressure for 15 minutes. The treated site will heal best if  a scab never forms (the wound heals by new skin cells traveling from the outside toward the middle-their journey is easier if no scab stands in their way).    Long-term care: the site will be more sensitive than your surrounding skin. Keep it covered, and remember to apply sunscreen every day to all your exposed skin. A scar may remain which is lighter or pinker than your normal skin. Your body will continue to improve your scar for up to one year.    Infection following this procedure is rare. However, if you are worried about the appearance of your site, contact your doctor. Complete healing of the site may take up to one month. We have a physician on call at all times. If you have any concerns about the site, please call our clinic at [phone]    Your stitches should be removed in 1 to 2 weeks.

## 2018-12-04 NOTE — Unmapped (Signed)
Return in about 3 months (around 03/06/2019) for Recheck.

## 2018-12-18 NOTE — Unmapped (Signed)
Lab orders placed at [facility] in anticipation of starting Humira.

## 2018-12-18 NOTE — Unmapped (Signed)
Spoke with Amy [name].  She has having worsening psoriasis especially on her elbows which even though is a relatively minor body surface area involvement she has significant negative impact on quality of life with severe itch.  She wonders if Humira might do better than the [place].  We discussed risks and benefits of Humira including increased risk of infection, cancer, she denies congestive heart failure, and the need to do some baseline labs.  I have sent these labs into [facility] and she will plan to get these.  If Humira gets approved for her we will plan on starting the Humira.    She does have decreased renal function but this should not be a contraindication to Humira.

## 2019-01-02 MED ORDER — APREMILAST 30 MG TABLET
ORAL_TABLET | Freq: Every day | ORAL | 2 refills | 30 days | Status: CP
Start: 2019-01-02 — End: 2020-01-02
  Filled 2019-01-07: qty 30, 30d supply, fill #0

## 2019-01-02 NOTE — Unmapped (Signed)
[facility] Refill Coordination Note    Specialty Medication(s) to be Shipped:   Inflammatory Disorders: Otezla    Other medication(s) to be shipped: [employer], DOB: 04/17/1961  Phone: [phone] (home)       All above HIPAA information was verified with patient.     Completed refill call assessment today to schedule patient's medication shipment from the [facility] [phone]).       Specialty medication(s) and dose(s) confirmed: Regimen is correct and unchanged.   Changes to medications: Ereka reports no changes at this time.  Changes to insurance: No  Questions for the pharmacist: No    Confirmed patient received Welcome Packet with first shipment. The patient will receive a drug information handout for each medication shipped and additional FDA Medication Guides as required.       DISEASE/MEDICATION-SPECIFIC INFORMATION        N/A    SPECIALTY MEDICATION ADHERENCE     Medication Adherence    Patient reported X missed doses in the last month: 0  Specialty Medication: Otezla 30 mg  Patient is on additional specialty medications: No  Patient is on more than two specialty medications: No  Any gaps in refill history greater than 2 weeks in the last 3 months: no  Demonstrates understanding of importance of adherence: yes  Informant: patient  Reliability of informant: reliable  Confirmed plan for next specialty medication refill: delivery by pharmacy  Refills needed for supportive medications: not needed                Otezla 30 mg. 7 days on hand      SHIPPING     Shipping address confirmed in Epic.     Delivery Scheduled: Yes, Expected medication delivery date: 082520.  However, Rx request for refills was sent to the provider as there are none remaining.     Medication will be delivered via Next Day Courier to the prescription address in Epic WAM.    Kamry Faraci D Tahirih Lair   [facility] Specialty Technician

## 2019-01-03 NOTE — Unmapped (Signed)
Per test claim for OTEZLA at the [facility], patient needs Medication Assistance Program for Prior Authorization.

## 2019-01-07 MED FILL — OTEZLA 30 MG TABLET: 30 days supply | Qty: 30 | Fill #0 | Status: AC

## 2019-01-19 LAB — CBC W/ DIFFERENTIAL
BANDED NEUTROPHILS ABSOLUTE COUNT: 0 10*3/uL (ref 0.0–0.1)
BASOPHILS ABSOLUTE COUNT: 0.1 10*3/uL (ref 0.0–0.2)
BASOPHILS RELATIVE PERCENT: 1 %
EOSINOPHILS ABSOLUTE COUNT: 0.1 10*3/uL (ref 0.0–0.4)
EOSINOPHILS RELATIVE PERCENT: 2 %
HEMATOCRIT: 42.5 % (ref 34.0–46.6)
HEMOGLOBIN: 13.7 g/dL (ref 11.1–15.9)
IMMATURE GRANULOCYTES: 0 %
LYMPHOCYTES ABSOLUTE COUNT: 3.3 10*3/uL — ABNORMAL HIGH (ref 0.7–3.1)
MEAN CORPUSCULAR HEMOGLOBIN CONC: 32.2 g/dL (ref 31.5–35.7)
MEAN CORPUSCULAR VOLUME: 91 fL (ref 79–97)
MONOCYTES ABSOLUTE COUNT: 0.6 10*3/uL (ref 0.1–0.9)
MONOCYTES RELATIVE PERCENT: 6 %
NEUTROPHILS ABSOLUTE COUNT: 5.2 10*3/uL (ref 1.4–7.0)
NEUTROPHILS RELATIVE PERCENT: 55 %
PLATELET COUNT: 160 10*3/uL (ref 150–450)
RED BLOOD CELL COUNT: 4.68 x10E6/uL (ref 3.77–5.28)
RED CELL DISTRIBUTION WIDTH: 14 % (ref 11.7–15.4)

## 2019-01-19 LAB — HEPATITIS B SURFACE ANTIGEN: Hepatitis B virus surface Ag:PrThr:Pt:Ser/Plas:Ord:IA: NEGATIVE

## 2019-01-19 LAB — HEP B SURFACE AB, QUAL: Hepatitis B virus surface Ab:PrThr:Pt:Ser:Ord:: NONREACTIVE

## 2019-01-19 LAB — CREATININE
CREATININE: 2.96 mg/dL — ABNORMAL HIGH (ref 0.57–1.00)
Creatinine:MCnc:Pt:Ser/Plas:Qn:: 2.96 — ABNORMAL HIGH

## 2019-01-19 LAB — ALT (SGPT): Alanine aminotransferase:CCnc:Pt:Ser/Plas:Qn:: 14

## 2019-01-19 LAB — AST (SGOT): Aspartate aminotransferase:CCnc:Pt:Ser/Plas:Qn:: 15

## 2019-01-19 LAB — BLOOD UREA NITROGEN: Urea nitrogen:MCnc:Pt:Ser/Plas:Qn:: 55 — ABNORMAL HIGH

## 2019-01-19 LAB — HEP C VIRUS AB: Hepatitis C virus Ab Signal/Cutoff:RelACnc:Pt:Ser/Plas:Qn:IA: <0.1

## 2019-01-19 LAB — HEPATITIS B CORE TOTAL ANTIBODY: Hepatitis B virus core Ab:PrThr:Pt:Ser/Plas:Ord:IA: NEGATIVE

## 2019-01-19 LAB — EOSINOPHILS RELATIVE PERCENT: Eosinophils/100 leukocytes:NFr:Pt:Bld:Qn:Automated count: 2

## 2019-01-24 NOTE — Unmapped (Signed)
I called and spoke with Ms. Dunckel.  For whatever reason her labs came back without a QuantiFERON test which looks like it is been canceled.  She says that she has had one for work within the last year and she will mail this to the office in [place].    She is aware that her creatinine is elevated this a chronic issue for her.  There are no dosage adjustments for Humira provided in the manufacturer's labeling.    Once I get her QuantiFERON test or PPD test by mail, I will plan on sending in the Humira.

## 2019-01-30 NOTE — Unmapped (Signed)
[facility] Refill Coordination Note    Specialty Medication(s) to be Shipped:   Inflammatory Disorders: Otezla    Other medication(s) to be shipped: [employer], DOB: [birth year]  Phone: [phone] (home)       All above HIPAA information was verified with patient.     Completed refill call assessment today to schedule patient's medication shipment from the [facility] [phone]).       Specialty medication(s) and dose(s) confirmed: Regimen is correct and unchanged.   Changes to medications: Arnice reports no changes at this time.  Changes to insurance: No  Questions for the pharmacist: No    Confirmed patient received Welcome Packet with first shipment. The patient will receive a drug information handout for each medication shipped and additional FDA Medication Guides as required.       DISEASE/MEDICATION-SPECIFIC INFORMATION        N/A    SPECIALTY MEDICATION ADHERENCE     Medication Adherence    Patient reported X missed doses in the last month: 0  Specialty Medication: otezla  Patient is on additional specialty medications: No  Any gaps in refill history greater than 2 weeks in the last 3 months: no  Demonstrates understanding of importance of adherence: yes  Informant: patient  Reliability of informant: reliable  Confirmed plan for next specialty medication refill: delivery by pharmacy  Refills needed for supportive medications: not needed                Otezla. 10 days on hand      SHIPPING     Shipping address confirmed in Epic.     Delivery Scheduled: Yes, Expected medication delivery date: 092520.     Medication will be delivered via Next Day Courier to the prescription address in Epic WAM.    Meryn Sarracino D Avanthika Dehnert   [facility] Specialty Technician

## 2019-02-07 MED FILL — OTEZLA 30 MG TABLET: 30 days supply | Qty: 30 | Fill #1 | Status: AC

## 2019-02-07 MED FILL — OTEZLA 30 MG TABLET: ORAL | 30 days supply | Qty: 30 | Fill #1

## 2019-02-11 DIAGNOSIS — L409 Psoriasis, unspecified: Secondary | ICD-10-CM

## 2019-02-11 MED ORDER — HUMIRA PEN 40 MG/0.8 ML SUBCUTANEOUS KIT: 40 mg | each | 36 refills | 84 days | Status: AC

## 2019-02-11 MED ORDER — HUMIRA PEN 40 MG/0.8 ML SUBCUTANEOUS KIT
SUBCUTANEOUS | 36 refills | 84.00000 days | Status: CP
Start: 2019-02-11 — End: 2019-02-12
  Filled 2019-03-06: qty 3, 30d supply, fill #0

## 2019-02-11 NOTE — Unmapped (Signed)
Negative PPD test obtained in December 2019.  She will need another PPD test and approximately 3 to 4 months.  I have sent the orders in for the Humira.  Discontinue the [place].

## 2019-02-11 NOTE — Unmapped (Signed)
Psoriasis of the elbow appt scheduled

## 2019-02-11 NOTE — Unmapped (Signed)
Per test claim for Humira at the [facility], patient needs Medication Assistance Program for Prior Authorization.

## 2019-02-12 NOTE — Unmapped (Signed)
Order for PPD test from [facility].

## 2019-02-18 NOTE — Unmapped (Signed)
[facility] Specialty Medication Onboarding    Specialty Medication: HUMIRA LOADING AND MAINTENANCE DOSES  Prior Authorization: Approved   Financial Assistance: No - copay  <$25  Final Copay/Day Supply: $0 / 28 DAYS    Insurance Restrictions: None     Notes to Pharmacist:     The triage team has completed the benefits investigation and has determined that the patient is able to fill this medication at [facility]. Please contact the patient to complete the onboarding or follow up with the prescribing physician as needed.

## 2019-02-20 ENCOUNTER — Other Ambulatory Visit: Payer: Self-pay | Admitting: Family Medicine

## 2019-02-20 DIAGNOSIS — Z20822 Contact with and (suspected) exposure to covid-19: Secondary | ICD-10-CM

## 2019-02-21 LAB — NOVEL CORONAVIRUS, NAA: SARS-CoV-2, NAA: NOT DETECTED

## 2019-03-05 MED ORDER — EMPTY CONTAINER: each | 2 refills | 0 days

## 2019-03-05 NOTE — Unmapped (Signed)
[facility]   Patient Onboarding/Medication Counseling    Amy [name] is a 58 y.o. female with psoriasis who I am counseling today on initiation of therapy.  I am speaking to the patient.    Verified patient's date of birth / HIPAA.    Specialty medication(s) to be sent: Inflammatory Disorders: Humira      Non-specialty medications/supplies to be sent: sharps kit      Medications not needed at this time: na         Humira (adalimumab)    Medication & Administration     Dosage: Plaque psoriasis: Inject 80mg  under the skin on day 1, then 40mg  every 14 days starting on day 8    Lab tests required prior to treatment initiation:  ? Tuberculosis: Tuberculosis screening resulted in a non-reactive TB skin test.  ? Hepatitis B: Hepatitis B serology studies are complete and non-reactive.    Administration:     Prefilled auto-injector pen  1. Gather all supplies needed for injection on a clean, flat working surface: medication pen removed from packaging, alcohol swab, sharps container, etc.  2. Look at the medication label - look for correct medication, correct dose, and check the expiration date  3. Look at the medication - the liquid visible in the window on the side of the pen device should appear clear and colorless  4. Lay the auto-injector pen on a flat surface and allow it to warm up to room temperature for at least 30-45 minutes  5. Select injection site - you can use the front of your thigh or your belly (but not the area 2 inches around your belly button); if someone else is giving you the injection you can also use your upper arm in the skin covering your triceps muscle  6. Prepare injection site - wash your hands and clean the skin at the injection site with an alcohol swab and let it air dry, do not touch the injection site again before the injection 7. Pull the 2 safety caps straight off - gray/white to uncover the needle cover and the plum cap to uncover the plum activator button, do not remove until immediately prior to injection and do not touch the white needle cover  8. Gently squeeze the area of cleaned skin and hold it firmly to create a firm surface at the selected injection site  9. Put the white needle cover against your skin at the injection site at a 90 degree angle, hold the pen such that you can see the clear medication window  10. Press down and hold the pen firmly against your skin, press the plum activator button to initiate the injection, there will be a click when the injection starts  11. Continue to hold the pen firmly against your skin for about 10-15 seconds - the window will start to turn solid yellow  12. To verify the injection is complete after 10-15 seconds, look and ensure the window is solid yellow and then pull the pen away from your skin  13. Dispose of the used auto-injector pen immediately in your sharps disposal container the needle will be covered automatically  14. If you see any blood at the injection site, press a cotton ball or gauze on the site and maintain pressure until the bleeding stops, do not rub the injection site    Adherence/Missed dose instructions:  If your injection is given more than 3 days after your scheduled injection date ??? consult your pharmacist for  additional instructions on how to adjust your dosing schedule.    Goals of Therapy     - Achieve remission/inactive disease or low/minimal disease activity  - Maintenance of function  - Minimization of systemic manifestations and comorbidities  - Maintenance of effective psychosocial functioning    Side Effects & Monitoring Parameters     ? Injection site reaction (redness, irritation, inflammation localized to the site of administration)  ? Signs of a common cold ??? minor sore throat, runny or stuffy nose, etc.  ? Upset stomach  ? Headache The following side effects should be reported to the provider:  ? Signs of a hypersensitivity reaction ??? rash; hives; itching; red, swollen, blistered, or peeling skin; wheezing; tightness in the chest or throat; difficulty breathing, swallowing, or talking; swelling of the mouth, face, lips, tongue, or throat; etc.  ? Reduced immune function ??? report signs of infection such as fever; chills; body aches; very bad sore throat; ear or sinus pain; cough; more sputum or change in color of sputum; pain with passing urine; wound that will not heal, etc.  Also at a slightly higher risk of some malignancies (mainly skin and blood cancers) due to this reduced immune function.  o In the case of signs of infection ??? the patient should hold the next dose of Humira?? and call your primary care provider to ensure adequate medical care.  Treatment may be resumed when infection is treated and patient is asymptomatic.  ? Changes in skin ??? a new growth or lump that forms; changes in shape, size, or color of a previous mole or marking  ? Signs of unexplained bruising or bleeding ??? throwing up blood or emesis that looks like coffee grounds; black, tarry, or bloody stool; etc.  ? Signs of new or worsening heart failure ??? shortness of breath; sudden weight gain; heartbeat that is not normal; swelling in the arms or legs that is new or worse      Contraindications, Warnings, & Precautions     ? Have your bloodwork checked as you have been told by your prescriber  ? Talk with your doctor if you are pregnant, planning to become pregnant, or breastfeeding  ? Discuss the possible need for holding your dose(s) of Humira?? when a planned procedure is scheduled with the prescriber as it may delay healing/recovery timeline       Drug/Food Interactions     ? Medication list reviewed in Epic. The patient was instructed to inform the care team before taking any new medications or supplements. No drug interactions identified. ? Talk with you prescriber or pharmacist before receiving any live vaccinations while taking this medication and after you stop taking it    Storage, Handling Precautions, & Disposal     ? Store this medication in the refrigerator.  Do not freeze  ? If needed, you may store at room temperature for up to 14 days  ? Store in [employer], protected from light  ? Do not shake  ? Dispose of used syringes/pens in a sharps disposal container            Current Medications (including OTC/herbals), Comorbidities and Allergies     Current Outpatient Medications   Medication Sig Dispense Refill   ??? amitriptyline (ELAVIL) 25 MG tablet AMITRIPTYLINE HCL 25 MG TABS     ??? apremilast 30 mg Tab Take 1 tablet (30 mg total) by mouth daily. 30 tablet 2   ??? aspirin (ECOTRIN) 81 MG tablet Take 81  mg by mouth daily.     ??? betamethasone dipropionate (DIPROLENE) 0.05 % ointment   4   ??? calcipotriene (DOVONEX) 0.005 % cream DOVONEX 0.005 % CREA     ??? cephalexin (KEFLEX) 500 MG capsule   0   ??? cholecalciferol, vitamin D3, (VITAMIN D3) 1,000 unit capsule Take 1,000 Units by mouth daily.     ??? clobetasol (TEMOVATE) 0.05 % ointment Apply to psoriasis rash 2 times daily as needed for rash. At night, apply medicine and then wrap in long sleeves or saran wrap 60 g 5   ??? halobetasol (ULTRAVATE) 0.05 % ointment Apply topically twice a day to psoriasis prn (Patient not taking: Reported on 01/17/2018) 60 g 5   ??? HUMIRA PEN 40 MG/0.8 ML SUBCUTANEOUS Inject the contents of 1 pen (40 mg total) under the skin every fourteen (14) days.  Begin one week after initial dose. 6 each 36   ??? insulin ASPART (NOVOLOG) 100 unit/mL injection Inject under the skin Three (3) times a day before meals. 10 mL 12   ??? insulin glargine (LANTUS) 100 unit/mL injection Inject 80 Units under the skin nightly.     ??? lisinopril (PRINIVIL,ZESTRIL) 10 MG tablet Take 10 mg by mouth daily.     ??? methocarbamol (ROBAXIN) 500 MG tablet   0 ??? pregabalin (LYRICA) 50 MG capsule Take 1 capsule by mouth 3 (three) times a day.  0   ??? rosuvastatin calcium (CRESTOR ORAL) Take by mouth.     ??? sodium bicarbonate 650 mg tablet Take 1 tablet (650 mg total) by mouth Two (2) times a day. 60 tablet 11   ??? TRUEPLUS PEN NEEDLE 32 gauge x 5/32 Ndle   10     No current facility-administered medications for this visit.        Allergies   Allergen Reactions   ??? Sulfur-8 Nausea Only   ??? Codeine    ??? Glucophage [Metformin]    ??? Morphine        Patient Active Problem List   Diagnosis   ??? Chronic kidney disease, stage 3   ??? 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 Stage 4 chronic kidney disease (CMS-HCC)   ??? Type 2 diabetes mellitus with stage 4 chronic kidney disease, with long-term current use of insulin (CMS-HCC)   ??? Essential hypertension   ??? Hyperlipidemia, unspecified hyperlipidemia type   ??? Tobacco use       Reviewed and up to date in Epic.    Appropriateness of Therapy     Is medication and dose appropriate based on diagnosis? Yes    Baseline Quality of Life Assessment      How many days over the past month did your psoriasis keep you from your normal activities? 0    Financial Information     Medication Assistance provided: Prior Authorization    Anticipated copay of $0 reviewed with patient. Verified delivery address.    Delivery Information     Scheduled delivery date: Wed, Oct 21    Expected start date: Wed, Oct 21    Medication will be delivered via Same Day Courier to the home address in [place].  This shipment will not require a signature. Explained the services we provide at [facility] and that each month we would call to set up refills.  Stressed importance of returning phone calls so that we could ensure they receive their medications in time each month.  Informed patient that  we should be setting up refills 7-10 days prior to when they will run out of medication.  A pharmacist will reach out to perform a clinical assessment periodically.  Informed patient that a welcome packet and a drug information handout will be sent.      Patient verbalized understanding of the above information as well as how to contact the pharmacy at [phone] option 4 with any questions/concerns.  The pharmacy is open Monday through Friday 8:30am-4:30pm.  A pharmacist is available 24/7 via pager to answer any clinical questions they may have.    Patient Specific Needs     ? Does the patient have any physical, cognitive, or cultural barriers? No    ? Patient prefers to have medications discussed with  Patient     ? Is the patient able to read and understand education materials at a high [place] level or above? Yes    ? Patient's primary language is  English     ? Is the patient high risk? No     ? Does the patient require a Care Management Plan? No     ? Does the patient require physician intervention or other additional services (i.e. nutrition, smoking cessation, social work)? No      Amy [name] A Desiree Lucy [facility] Specialty Pharmacist

## 2019-03-05 NOTE — Unmapped (Signed)
Negative PPD test was received from 02/13/2019, taken at [facility] clinic.

## 2019-03-06 MED FILL — HUMIRA PEN CITRATE FREE STARTER PACK FOR PS/UV 1X 80 MG/0.8 ML, 2X 40 MG/0.4 ML: 30 days supply | Qty: 3 | Fill #0 | Status: AC

## 2019-03-06 MED FILL — EMPTY CONTAINER: 120 days supply | Qty: 1 | Fill #0

## 2019-03-06 MED FILL — EMPTY CONTAINER: 120 days supply | Qty: 1 | Fill #0 | Status: AC

## 2019-03-13 NOTE — Unmapped (Signed)
[facility] Pharmacist Intervention    Type of intervention: Auto-injector misfire    Medication: Humira    Problem: Radie called to report a mis-fire of her second dose (40 mg). She didn't remove one of the caps, and the medication didn't administer properly. She first called Humira, and they advised she give the second 40 mg dose in her pack. She then called in to [facility] pharmacy.    Intervention: I agreed with her that giving 40 mg dose again was the right thing to do. I offered to provide the Humira number for her to report misfire. She elected to just have medication filled through insurance since it would be covered. We set up delivery for 11/5 same day courier to the temporary address, and she knows to continue dosing as planned (next dose due 11/11).     Follow up needed: none needed at this time.     Approximate time spent: 25 minutes    Hillary Schwegler A Desiree Lucy Shared [facility] Specialty Pharmacist

## 2019-03-21 MED FILL — HUMIRA PEN CITRATE FREE 40 MG/0.4 ML: SUBCUTANEOUS | 28 days supply | Qty: 2 | Fill #0

## 2019-03-21 MED FILL — HUMIRA PEN CITRATE FREE 40 MG/0.4 ML: 28 days supply | Qty: 2 | Fill #0 | Status: AC

## 2019-04-02 ENCOUNTER — Encounter: Admit: 2019-04-02 | Discharge: 2019-04-03 | Payer: PRIVATE HEALTH INSURANCE

## 2019-04-02 DIAGNOSIS — L409 Psoriasis, unspecified: Principal | ICD-10-CM

## 2019-04-02 MED ORDER — TRIAMCINOLONE ACETONIDE 0.1 % TOPICAL OINTMENT
Freq: Two times a day (BID) | TOPICAL | 0 refills | 0.00000 days | Status: CP
Start: 2019-04-02 — End: 2020-04-01

## 2019-04-02 NOTE — Unmapped (Signed)
ASSESSMENT AND PLAN:    Psoriasis, <5% BSA involvement  -previously on Otezla, switched to Humira earlier this year and has now been on Humira for about one month with mild improvement  -we will continue Humira for three months to determine response, at which time we can discuss other options  -continue zyrtec 10 mg daily for pruritus, discussed she can increase to twice a day if her itching is not controlled with this  -rx sent for triamcinolone 0.1% ointment BID for flares on elbows  -stressed importance of smoking cessation  -moisturize with emollient product like Vaseline     Return to clinic: 3 months    CHIEF COMPLAINT:  f/u psoriasis on elbow, patient states that it is still about the same, not as itchy as usual but still physically the same    HPI:   This is a pleasant 58 y.o.-year-old who last saw me on 12/04/2018.  At that time she was on [facility] for psoriasis.  However, she said that she was still bothered by her condition, especially on her elbows and was interested in being more aggressive.  We therefore started her on Humira after appropriate lab work-up.  She returns today for follow-up.    She has been taking Humira for one month now and has not seen significant improvement yet. She has involvement of the elbows bilaterally and small spots on the legs. She has had significant improvement in itching and zyrtec is very helpful. She has not been using the clobetasol since she was having bruising at her last appointment. She endorses joint pains in the left elbow and lower back.    No other skin concerns today.     PAST MEDICAL HISTORY:  Diabetes  HTN  Alport's disease  No history of skin cancer    MEDICATIONS:      Current Outpatient Medications on File Prior to Visit   Medication Sig Dispense Refill   ??? ADALIMUMAB PEN CITRATE FREE 40 MG/0.4 ML Inject the contents of 1 pen (40 mg total) under the skin every fourteen (14) days. 6 each 36   ??? aspirin (ECOTRIN) 81 MG tablet Take 81 mg by mouth daily.     ??? betamethasone dipropionate (DIPROLENE) 0.05 % ointment   4   ??? calcipotriene (DOVONEX) 0.005 % cream DOVONEX 0.005 % CREA     ??? cholecalciferol, vitamin D3, (VITAMIN D3) 1,000 unit capsule Take 1,000 Units by mouth daily.     ??? clobetasol (TEMOVATE) 0.05 % ointment Apply to psoriasis rash 2 times daily as needed for rash. At night, apply medicine and then wrap in long sleeves or saran wrap 60 g 5   ??? empty container Misc Use as directed to dispose of Humira pens. 1 each 2   ??? insulin ASPART (NOVOLOG) 100 unit/mL injection Inject under the skin Three (3) times a day before meals. 10 mL 12   ??? insulin glargine (LANTUS) 100 unit/mL injection Inject 80 Units under the skin nightly.     ??? lisinopril (PRINIVIL,ZESTRIL) 10 MG tablet Take 10 mg by mouth daily.     ??? pregabalin (LYRICA) 50 MG capsule Take 1 capsule by mouth 3 (three) times a day.  0   ??? rosuvastatin calcium (CRESTOR ORAL) Take by mouth.     ??? sodium bicarbonate 650 mg tablet Take 1 tablet (650 mg total) by mouth Two (2) times a day. 60 tablet 11   ??? TRUEPLUS PEN NEEDLE 32 gauge x 5/32 Ndle   10   ??? [  EXPIRED] adalimumab 80 mg/0.8 mL-40 mg/0.4 mL PnKt Inject the contents of 1 pen (80 mg) under the skin on day 1, then 1 pen (40 mg) on day 8, then 1 pen (40 mg)  every 14 days thereafter. 3 each 0     No current facility-administered medications on file prior to visit.        ALLERGIES:   Sulfur-8, Codeine, Glucophage [metformin], and Morphine    SOCIAL HISTORY:  Works night shift as CNA  Current everyday smoker     FAMILY HISTORY:  No family history of psoriasis    REVIEW OF SYSTEMS:  Baseline state of health. No recent illnesses. No other skin complaints.    PHYSICAL EXAMINATION:  Examination in the presence of female chaperone:  General: Well-developed, well-nourished. No acute distress. Neuro: Alert and oriented, answers questions appropriately.  Skin: Examination of the scalp, face, neck, chest, abdomen, back, bilateral upper extremities, bilateral lower extremities, palms, nails, and soles was performed and notable for the following:    -erythematous scaly plaques on bilateral elbows, with single similar-appearing smaller plaques on right thigh and left knee  -nails appear normal    The patient was seen and examined by Abbie Sons, MD who agrees with the assessment and plan as above.

## 2019-04-02 NOTE — Unmapped (Addendum)
You can increase Zyrtec to twice a day if you remain itchy with once daily.     You can apply triamcinolone ointment twice a day to flaring areas.

## 2019-04-16 NOTE — Unmapped (Addendum)
Amy [name] reports no change in her skin since starting Humira. She is tolerating injections well. She did report two small purplish spots on the back of her leg that aren't painful or itchy - but appear to have some pus/white centers. I advised it could be something like folliculitis, and should resolve on its own, but to let dermatology know if it worsens.     [facility] Clinical Assessment & Refill Coordination Note    Amy [name], DOB: 05/08/1961  Phone: [phone] (home)     All above HIPAA information was verified with patient.     Specialty Medication(s):   Inflammatory Disorders: Humira     Current Outpatient Medications   Medication Sig Dispense Refill   ??? ADALIMUMAB PEN CITRATE FREE 40 MG/0.4 ML Inject the contents of 1 pen (40 mg total) under the skin every fourteen (14) days. 6 each 36   ??? aspirin (ECOTRIN) 81 MG tablet Take 81 mg by mouth daily.     ??? betamethasone dipropionate (DIPROLENE) 0.05 % ointment   4   ??? calcipotriene (DOVONEX) 0.005 % cream DOVONEX 0.005 % CREA     ??? cholecalciferol, vitamin D3, (VITAMIN D3) 1,000 unit capsule Take 1,000 Units by mouth daily.     ??? clobetasol (TEMOVATE) 0.05 % ointment Apply to psoriasis rash 2 times daily as needed for rash. At night, apply medicine and then wrap in long sleeves or saran wrap 60 g 5   ??? empty container Misc Use as directed to dispose of Humira pens. 1 each 2   ??? insulin ASPART (NOVOLOG) 100 unit/mL injection Inject under the skin Three (3) times a day before meals. 10 mL 12   ??? insulin glargine (LANTUS) 100 unit/mL injection Inject 80 Units under the skin nightly.     ??? lisinopril (PRINIVIL,ZESTRIL) 10 MG tablet Take 10 mg by mouth daily.     ??? pregabalin (LYRICA) 50 MG capsule Take 1 capsule by mouth 3 (three) times a day.  0   ??? rosuvastatin calcium (CRESTOR ORAL) Take by mouth.     ??? sodium bicarbonate 650 mg tablet Take 1 tablet (650 mg total) by mouth Two (2) times a day. 60 tablet 11 ??? triamcinolone (KENALOG) 0.1 % ointment Apply topically Two (2) times a day. 453.6 g 0   ??? TRUEPLUS PEN NEEDLE 32 gauge x 5/32 Ndle   10     No current facility-administered medications for this visit.         Changes to medications: Anaih reports no changes at this time.    Allergies   Allergen Reactions   ??? Sulfur-8 Nausea Only   ??? Codeine    ??? Glucophage [Metformin]    ??? Morphine        Changes to allergies: No    SPECIALTY MEDICATION ADHERENCE     Humira - 1 left  Medication Adherence    Patient reported X missed doses in the last month: 0  Specialty Medication: Humira  Patient is on additional specialty medications: No          Specialty medication(s) dose(s) confirmed: Regimen is correct and unchanged.     Are there any concerns with adherence? No    Adherence counseling provided? Not needed    CLINICAL MANAGEMENT AND INTERVENTION      Clinical Benefit Assessment:    Do you feel the medicine is effective or helping your condition? no, still early    Clinical Benefit counseling provided? Reasonable expectations  discussed: may take 8-12 weeks, saw derm recently who gave same recommendation to keep on med    Adverse Effects Assessment:    Are you experiencing any side effects? No    Are you experiencing difficulty administering your medicine? No    Quality of Life Assessment:    How many days over the past month did your psoriasis  keep you from your normal activities? For example, brushing your teeth or getting up in the morning. 0    Have you discussed this with your provider? Not needed    Therapy Appropriateness:    Is therapy appropriate? Yes, therapy is appropriate and should be continued    DISEASE/MEDICATION-SPECIFIC INFORMATION      For patients on injectable medications: Patient currently has 1 doses left.  Next injection is scheduled for tomorrow.    PATIENT SPECIFIC NEEDS     ? Does the patient have any physical, cognitive, or cultural barriers? No    ? Is the patient high risk? No ? Does the patient require a Care Management Plan? No     ? Does the patient require physician intervention or other additional services (i.e. nutrition, smoking cessation, social work)? No      SHIPPING     Specialty Medication(s) to be Shipped:   Inflammatory Disorders: Humira    Other medication(s) to be shipped: na     Changes to insurance: No    Delivery Scheduled: Yes, Expected medication delivery date: Thurs, Dec 10.     Medication will be delivered via Same Day Courier to the confirmed prescription address in [facility].    The patient will receive a drug information handout for each medication shipped and additional FDA Medication Guides as required.  Verified that patient has previously received a Conservation officer, historic buildings.    All of the patient's questions and concerns have been addressed.    Lanney Gins   [facility] Specialty Pharmacist

## 2019-04-25 MED FILL — HUMIRA PEN CITRATE FREE 40 MG/0.4 ML: 28 days supply | Qty: 2 | Fill #1 | Status: AC

## 2019-04-25 MED FILL — HUMIRA PEN CITRATE FREE 40 MG/0.4 ML: SUBCUTANEOUS | 28 days supply | Qty: 2 | Fill #1

## 2019-05-13 MED ORDER — SODIUM BICARBONATE 650 MG TABLET
ORAL_TABLET | Freq: Two times a day (BID) | ORAL | 11 refills | 30 days | Status: CP
Start: 2019-05-13 — End: 2020-05-12

## 2019-05-14 NOTE — Unmapped (Signed)
[facility] Special[facility] Pharmacy Refill Coordination Note    Patient has seen some improvement since being on Humira    Specialty Medication(s) to be Shipped:   Inflammatory Disorders: Humira    Other medication(s) to be shipped: n/a     [employer], DOB: 14-Apr-1961  Phone: [phone] (home)       All above HIPAA information was verified with patient.     Was a Nurse, learning disability used for this call? No    Completed refill call assessment today to schedule patient's medication shipment from the [facility] [phone]).       Specialty medication(s) and dose(s) confirmed: Regimen is correct and unchanged.   Changes to medications: Analiz reports no changes at this time.  Changes to insurance: No  Questions for the pharmacist: No    Confirmed patient received Welcome Packet with first shipment. The patient will receive a drug information handout for each medication shipped and additional FDA Medication Guides as required.       DISEASE/MEDICATION-SPECIFIC INFORMATION        For patients on injectable medications: Patient currently has 1 doses left.  Next injection is scheduled for 05/15/2019.    SPECIALTY MEDICATION ADHERENCE     Medication Adherence    Patient reported X missed doses in the last month: 0  Specialty Medication: Humira 40 mg/0.8 ml   Patient is on additional specialty medications: No  Any gaps in refill history greater than 2 weeks in the last 3 months: no  Demonstrates understanding of importance of adherence: yes  Informant: patient  Reliability of informant: reliable  Confirmed plan for next specialty medication refill: delivery by pharmacy  Refills needed for supportive medications: not needed                Humira 40 mg/0.8 ml . 14 days on hand      SHIPPING     Shipping address confirmed in Epic.     Delivery Scheduled: Yes, Expected medication delivery date: 05/22/2019.     Medication will be delivered via Same Day Courier to the prescription address in Epic WAM.    Agusta Hackenberg D Marissa Lowrey [facility] Specialty Technician

## 2019-05-22 DIAGNOSIS — L409 Psoriasis, unspecified: Principal | ICD-10-CM

## 2019-05-22 NOTE — Unmapped (Signed)
Amy [name] 's Humira shipment will be delivered on 05/27/19 via [facility].

## 2019-05-22 NOTE — Unmapped (Signed)
Amy [name] 's Humira shipment will be canceled  as a result of a high copay. Patient will need to call for Humira copay card per referral.    I have reached out to the patient and communicated the delay. We will wait for a call back from the patient to reschedule the delivery.  We have canceled this work request.

## 2019-05-27 MED FILL — HUMIRA PEN CITRATE FREE 40 MG/0.4 ML: 28 days supply | Qty: 2 | Fill #2 | Status: AC

## 2019-05-27 MED FILL — HUMIRA PEN CITRATE FREE 40 MG/0.4 ML: SUBCUTANEOUS | 28 days supply | Qty: 2 | Fill #2

## 2019-06-17 NOTE — Unmapped (Signed)
ASSESSMENT AND PLAN:    Psoriasis, <5% BSA involvement, resistant to Humira and topical steroids   -previously on Otezla, switched to Humira in Oct 2020 with about 50% improvement in symptoms   -discussed diagnosis and treatment options including addition of calcipotriene to topical regimen, consideration of increase in Humira dose (although hesitant to do this in setting of poor renal function), or trying to get home light box approved since we are not doing light therapy in-office right now with COVID  -continue Humira 40 mg q14 days   -she is interested in trying to get a light box for home, we will send order to [employer] to see if we can get approved through her insurance   -continue zyrtec 10 mg daily for pruritus. Discussed that her pruritus might be secondary to kidney disease or diabetes   -continue triamcinolone 0.1% ointment BID for flares on elbows, refilled today   -start calcipotriene 0.005% cream BID to affected areas   -stressed importance of smoking cessation  -moisturize with emollient product like Vaseline     High risk medication use, Humira  - negative quantiferon gold in 01/2019 at [facility]    Return to clinic: 4 months psoriasis    CHIEF COMPLAINT:  Lesion Of Concern spots of concern on both legs;itchy with pus;been present for 2 months       HPI:   This is a pleasant 59 y.o.-year-old who last saw me on 04/02/2019.  She was seen for psoriasis.  We had previously discussed that this was clinically of limited involvement but it caused her a significant amount of distress in terms of pruritus and so she had had a strong preference to treat this more aggressively.  She had previously been on [place] but we switched her to Humira in Oct 2020.  At last visit she had been on Humira for about 1 month with mild improvement (started around October 2020).  We had also continued Zyrtec for pruritus and triamcinolone ointment twice daily.  Her last PPD test was from 02/13/2019 which was taken at [facility] clinic.    Today she notes improvement with Humira. She thinks it works well for the first week but the second week she has return of rash and itch. She is using triamcinolone twice a day.     No other skin concerns today.     PAST MEDICAL HISTORY:  Diabetes  HTN  Alport's disease  No history of skin cancer (chronic kidney disease)    MEDICATIONS:      Current Outpatient Medications on File Prior to Visit   Medication Sig Dispense Refill   ??? ADALIMUMAB PEN CITRATE FREE 40 MG/0.4 ML Inject the contents of 1 pen (40 mg total) under the skin every fourteen (14) days. 6 each 36   ??? aspirin (ECOTRIN) 81 MG tablet Take 81 mg by mouth daily.     ??? betamethasone dipropionate (DIPROLENE) 0.05 % ointment   4   ??? calcipotriene (DOVONEX) 0.005 % cream DOVONEX 0.005 % CREA     ??? cholecalciferol, vitamin D3, (VITAMIN D3) 1,000 unit capsule Take 1,000 Units by mouth daily.     ??? clobetasol (TEMOVATE) 0.05 % ointment Apply to psoriasis rash 2 times daily as needed for rash. At night, apply medicine and then wrap in long sleeves or saran wrap 60 g 5   ??? empty container Misc Use as directed to dispose of Humira pens. 1 each 2   ??? insulin ASPART (NOVOLOG) 100 unit/mL  injection Inject under the skin Three (3) times a day before meals. 10 mL 12   ??? insulin glargine (LANTUS) 100 unit/mL injection Inject 80 Units under the skin nightly.     ??? lisinopril (PRINIVIL,ZESTRIL) 10 MG tablet Take 10 mg by mouth daily.     ??? pregabalin (LYRICA) 50 MG capsule Take 1 capsule by mouth 3 (three) times a day.  0   ??? rosuvastatin calcium (CRESTOR ORAL) Take by mouth.     ??? sodium bicarbonate 650 mg tablet Take 1 tablet (650 mg total) by mouth Two (2) times a day. 60 tablet 11   ??? triamcinolone (KENALOG) 0.1 % ointment Apply topically Two (2) times a day. 453.6 g 0   ??? TRUEPLUS PEN NEEDLE 32 gauge x 5/32 Ndle   10     No current facility-administered medications on file prior to visit.        ALLERGIES:   Sulfur-8, Codeine, Glucophage [metformin], and Morphine    SOCIAL HISTORY:  Works as a Lawyer in a nursing home    REVIEW OF SYSTEMS:  Baseline state of health. No recent illnesses. No other skin complaints.    PHYSICAL EXAMINATION:  Examination in the presence of female chaperone:  General: Well-developed, well-nourished. No acute distress. Neuro: Alert and oriented, answers questions appropriately.  Skin: Examination of the scalp, face, neck, chest, abdomen, back, bilateral upper extremities, bilateral lower extremities, palms, nails, and soles was performed and notable for the following:    -erythematous plaques with silvery scale on extensor upper extremities  -a few erythematous scaly papules on lower extremities     The patient was seen and examined by Abbie Sons, MD who agrees with the assessment and plan as above.

## 2019-06-18 ENCOUNTER — Encounter: Admit: 2019-06-18 | Discharge: 2019-06-19 | Payer: PRIVATE HEALTH INSURANCE

## 2019-06-18 DIAGNOSIS — L409 Psoriasis, unspecified: Principal | ICD-10-CM

## 2019-06-18 MED ORDER — TRIAMCINOLONE ACETONIDE 0.1 % TOPICAL OINTMENT
Freq: Two times a day (BID) | TOPICAL | 0 refills | 0 days | Status: CP
Start: 2019-06-18 — End: 2020-06-17

## 2019-06-18 MED ORDER — CALCIPOTRIENE 0.005 % TOPICAL CREAM
Freq: Two times a day (BID) | TOPICAL | 2 refills | 0 days | Status: CP
Start: 2019-06-18 — End: ?

## 2019-06-18 NOTE — Unmapped (Signed)
[facility] Special[facility] Pharmacy Refill Coordination Note    Specialty Medication(s) to be Shipped:   Inflammatory Disorders: Humira    Other medication(s) to be shipped:       Amy [name], DOB: 1960-06-30  Phone: [phone] (home)       All above HIPAA information was verified with patient.     Was a Nurse, learning disability used for this call? No    Completed refill call assessment today to schedule patient's medication shipment from the [facility] [phone]).       Specialty medication(s) and dose(s) confirmed: Regimen is correct and unchanged.   Changes to medications: Amy [name] reports starting the following medications: naproxen,tizantidine  Changes to insurance: No  Questions for the pharmacist: No    Confirmed patient received Welcome Packet with first shipment. The patient will receive a drug information handout for each medication shipped and additional FDA Medication Guides as required.       DISEASE/MEDICATION-SPECIFIC INFORMATION        For patients on injectable medications: Patient currently has 0 doses left.  Next injection is scheduled for 02/10.    SPECIALTY MEDICATION ADHERENCE     Medication Adherence    Patient reported X missed doses in the last month: 0  Specialty Medication: Humira 40mg /0.80ml  Patient is on additional specialty medications: No  Informant: patient                Humira 40/0.4 mg/ml: 0 days of medicine on hand         SHIPPING     Shipping address confirmed in Epic.     Delivery Scheduled: Yes, Expected medication delivery date: 02/05.     Medication will be delivered via Same Day Courier to the prescription address in Epic WAM.    Antonietta Barcelona   [facility] Specialty Technician

## 2019-06-18 NOTE — Unmapped (Addendum)
Start Dovonex cream twice a day along with triamcinolone cream.     We will send an order for a home light box to see if we can get this approved through your insurance.

## 2019-06-21 MED FILL — HUMIRA PEN CITRATE FREE 40 MG/0.4 ML: SUBCUTANEOUS | 28 days supply | Qty: 2 | Fill #3

## 2019-06-21 MED FILL — HUMIRA PEN CITRATE FREE 40 MG/0.4 ML: 28 days supply | Qty: 2 | Fill #3 | Status: AC

## 2019-07-09 ENCOUNTER — Encounter: Admit: 2019-07-09 | Discharge: 2019-07-10 | Payer: PRIVATE HEALTH INSURANCE

## 2019-07-09 ENCOUNTER — Encounter
Admit: 2019-07-09 | Discharge: 2019-07-10 | Payer: PRIVATE HEALTH INSURANCE | Attending: Rehabilitative and Restorative Service Providers" | Primary: Rehabilitative and Restorative Service Providers"

## 2019-07-09 NOTE — Unmapped (Signed)
ORTHOPAEDIC CLINIC NOTE  Date: 07/09/2019   [facility]    Primary Care Physician: Emogene Morgan, MD  Established patient, new problem.    ASSESSMENT:  Amy [name] is a 59 y.o. 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 She had her second vaccine on 07/05/19 and still wishes to proceed with today's injection.  - Home exercise program provided  - Ice affected area for 15-20 minutes every hour as needed  - Corticosteroid injection administered as described below  - Follow-up as needed  - If symptoms fail to improve, the patient will follow-up with one of our ultrasound-trained providers for further imaging and possible guided injection for treatment of plica syndrome.       Requested Prescriptions      No prescriptions requested or ordered in this encounter      Scheduling Notes:  Planned Return with ultrasound-trained provider if left knee symptoms fail to improve.  SUBJECTIVE:  Chief complaint: Knee pain   History of Present Illness:   (>4: location, quality, severity, timing, duration, context, modifying factors, associated symptoms)  59 y.o. female who presents for evaluation of Left Knee pain. Mechanism of Injury: unknown, date: 1 month ago, the patient denies any specific injuries and reports worsening medial sided pain that is aggravated most at night and when ambulating up and down stairs. She has not tried anything to treat her pain and is unable to take oral NSAIDs due to decreased kidney function.    Patient denies numbness/tingling distally.         Review of Systems Pertinent positives and negatives are documented in the HPI. All other systems reviewed are negative.   Medical History Past Medical History:   Diagnosis Date   ??? Alport syndrome    ??? Back pain    ??? Chronic kidney disease ??? Diabetes mellitus (CMS-HCC)    ??? Hyperlipidemia    ??? Hypertension    ??? Kidney stone    ??? Psoriasis       Surgical History Past Surgical History:   Procedure Laterality Date   ??? CATARACT EXTRACTION Right 08/29/2017   ??? COLONOSCOPY     ??? cyst removal in breast     ??? HYSTERECTOMY     ??? NEPHROSTOMY     ??? PR COLSC FLX W/RMVL OF TUMOR POLYP LESION SNARE TQ N/A 06/19/2018    Procedure: COLONOSCOPY FLEX; W/REMOV TUMOR/LES BY SNARE;  Surgeon: Jarvis Morgan, MD;  Location: [facility];  Service: Gastroenterology   ??? PR XCAPSL CTRC RMVL INSJ IO LENS PROSTH W/O ECP Right 08/29/2017    Procedure: EXTRACAPSULAR CATARACT REMOVAL W/INSERTION OF INTRAOCULAR LENS PROSTHESIS, MANUAL OR MECHANICAL TECHNIQUE Lens Choice:OD ZCB00 +19.50 [id number] +19.00/+18.00;  Surgeon: O'Rese Bonney Roussel, MD;  [facility];  Service: Ophthalmology   ??? PR XCAPSL CTRC RMVL INSJ IO LENS PROSTH W/O ECP Left 09/19/2017    Procedure: EXTRACAPSULAR CATARACT REMOVAL W/INSERTION OF INTRAOCULAR LENS PROSTHESIS, MANUAL OR MECHANICAL TECHNIQUE Lens Choice: ZCB00 +21.00; ZA9003 +20.00/+19.00;  Surgeon: O'Rese Bonney Roussel, MD;  Location: [facility] OR [facility];  Service: Ophthalmology      Allergies Sulfur-8, Codeine, Glucophage [metformin], and Morphine   Medications She has a current medication list  which includes the following prescription(s): adalimumab, aspirin, betamethasone dipropionate, calcipotriene, cholecalciferol (vitamin d3), clobetasol, empty container, insulin aspart, insulin glargine, lisinopril, pregabalin, pregabalin, rosuvastatin, rosuvastatin calcium, sodium bicarbonate, triamcinolone, and trueplus pen needle.   Family History Her family history includes Cancer in her paternal grandmother; Cataracts in her mother; Diabetes in her mother; Heart attack in her father; Kidney disease in her mother.   Social History She reports that she has been smoking. She has been smoking about 1.00 pack per day. She has never used smokeless tobacco. She reports that she does not drink alcohol or use drugs.Home address:[address]  [place] [address]        Occupational History   ??? Not on file     Social History     Socioeconomic History   ??? Marital status: Single     Spouse name: Not on file   ??? Number of children: Not on file   ??? Years of education: Not on file   ??? Highest education level: Not on file   Occupational History   ??? Not on file   Social Needs   ??? Financial resource strain: Patient refused   ??? Food insecurity     Worry: Patient refused     Inability: Patient refused   ??? Transportation needs     Medical: Patient refused     Non-medical: Patient refused   Tobacco Use   ??? Smoking status: Current Every Day Smoker     Packs/day: 1.00   ??? Smokeless tobacco: Never Used   Substance and Sexual Activity   ??? Alcohol use: No   ??? Drug use: No   ??? Sexual activity: Not Currently   Lifestyle   ??? Physical activity     Days per week: Patient refused     Minutes per session: Patient refused   ??? Stress: Not on file   Relationships   ??? Social Wellsite geologist on phone: Patient refused     Gets together: Patient refused     Attends religious service: Patient refused     Active member of club or organization: Patient refused     Attends meetings of clubs or organizations: Patient refused     Relationship status: Patient refused   Other Topics Concern   ??? Do you use sunscreen? No   ??? Tanning bed use? Yes   ??? Are you easily burned? No   ??? Excessive sun exposure? No   ??? Blistering sunburns? Yes   Social History Narrative   ??? Not on file        OBJECTIVE:  DETAILED PHYSICAL EXAM (12 Point)  General Appearance ?? well-nourished, in no acute distress.   Mood and Affect ?? alert, cooperative and pleasant.   Pulmonary ?? No labored breathing or shortness of breath   Cardiovascular ?? well-perfused distally and no edema.   Lymphatics ?? No lymphadenopathy   Sensation ?? sensation to light touch distally normal    MUSCULOSKELETAL    Right Knee Inspection/palpation  Range of motion  Stability  Strength  Skin ? Inspection/palpation:  No swelling, erythema, deformity, atrophy or hypertrophy noted.  ? Range of motion: Normal without limitations  ? Stability:  No instability noted.  ? Strength:  5/5 strength in all directions  ? Skin:  Warm, dry, and intact   Left Knee  Inspection/palpation  Range of motion  Stability  Strength  Skin ? Inspection: No swelling, erythema, or deformity  ?? Palpation:  Tender: superomedial edge of the patella with a palpable plica band  ? Range of motion:  0-140   ? Strength:  5/5 strength   ? Special Tests: Negative McMurray's test, Negative Lachman's test, Negative Valgus stress test at 0 degrees extension, Negative Valgus stress test at 30 degrees flexion, Negative Varus stress test, Negative Patellar Apprehention test  ? Skin:  Warm, dry, and intact     Test Results  Imaging  Four views of the left Knee were obtained today and independently reviewed by me and reveal mild degenerative changes with joint space narrowing without osteophyte formation.    Procedure  Injection: After discussing the risks and benefits of corticosteroid injection, informed consent was obtained and a timeout was performed. The patient's left knee was prepped using chloraprep. Topical anesthesia was obtained using ethyl chloride spray, followed by injection using 1ml of 40 mg/mL of Kenalog, 2ml of 0.5% ropivacaine, and 2ml of 1% lidocaine through the anterolateral portal site.  The patient tolerated the injection well and understands what to expect over the next several days.     Medical Decision Making   (2/3 Highest determine E&M code)  99212/99202 is minimal or none    Number and Complexity of Problems Addressed 1 undiagnosed new problem with uncertain prognosis (78295, 99214)   Amount and/or Complexity of Data to  be Reviewed and Analyzed ---Independent Interpretation of Tests----  Independent interpretation of a test performed by another physician/other qualified health care professional (e.g., clinic x-rays) (99214/99204)   Risk of Complications and/or Morbidity or Mortality of Patient Management Low risk of morbidity from additional diagnostic testing or treatment (99213/99203)

## 2019-07-09 NOTE — Unmapped (Signed)
- Ice affected area for 15-20 minutes 2-3 times daily  - Elevate while icing  - Perform Home Exercises as described below  - If you have any questions or concerns, contact our sports team coverage line at [phone]    COVID-19 VACCINATION UPDATES    - If you have any questions are concerns about whether or not you should receive the COVID-19 vaccine or to find out if and when you are eligible, please visit [email or web address] for further information.    Homemade Ice Pack  Supplies:  - Quart-size ziploc bag  - Rubbing Alcohol  - Water    Combine 1?? cups of water with a ?? cup of rubbing alcohol into ziploc bag. Seal and put in the freezer for several hours or overnight.    Patient Instructions Following Cortisone Injections    A corticosteroid injection was performed for you in the office today. Your Knee was injected in order to reduce the pain and inflammation that you are experiencing.   Please note that not everyone will have a lasting response following the injection. A repeat injection may be needed if symptoms return, but these injections cannot be performed any sooner than 3-4 months apart.    ??  Content of the Injection?????????????????????????????????????????????????????????The injection consists of three medications. Kenalog (an anti-inflammatory that will take 48-72 hours to take effect), Lidocaine ( a numbing agent that will last 30 minutes), and Ropivacaine (a numbing agent that will last 2-3 hours). Once the Ropivacaine wears off, you may have an increase in your pain. Icing the affected area for 20 minutes will help reduce this.  ?? Post-injection Instructions???????????????????????????????????????????????????.It is recommended that you refrain from any high level activities using the joint or limb that was injected for approximately 4-7 days.    Possible Side Effects    ?? Skin discoloration????????????????????????????????????..???????????????????????????Individuals with dark complexions may experience some skin discoloration locally at the site of the injection.  ?? Steroid Flare-Up?????????????????????????????????????????????????????????????????????.There is the possibility of an increase in discomfort within 48 hours following the injection. This is called a ???flare???. To help avoid this, abide by the activity restriction above.  ?? Infection?????????????????????????????????????????????????????????????????????????????????.There is a less than 1% chance of an infection. If you notice any signs of infection (redness, warmth, drainage, fever greater than 100 degrees) you should call [facility] immediately at [phone].      Knee flexion with heel slide    ?? Lie on your back with your knees bent.  ?? Slide your heel back by bending your affected knee as far as you can. Then hook your other foot around your ankle to help pull your heel even farther back.  ?? Hold for about 6 seconds, then rest for up to 10 seconds.  ?? Repeat 8 to 12 times.    Quad sets    ?? Sit with your leg straight and supported on the floor or a firm bed. (If you feel discomfort in the front or back of your knee, place a small towel roll under your knee.)  ?? Tighten the muscles on top of your thigh by pressing the back of your knee flat down to the floor. (If you feel discomfort under your kneecap, place a small towel roll under your knee.)  ?? Hold for about 6 seconds, then rest up to 10 seconds.  ?? Do 8 to 12 repetitions several times a day.  Straight-leg raises to the front    ?? Lie on your back with your good knee bent so that your foot rests flat  on the floor. Your injured leg should be straight. Make sure that your low back has a normal curve. You should be able to slip your flat hand in between the floor and the small of your back, with your palm touching the floor and your back touching the back of your hand.  ?? Tighten the thigh muscles in the injured leg by pressing the back of your knee flat down to the floor. Hold your knee straight.  ?? Keeping the thigh muscles tight, lift your injured leg up so that your heel is about 12 inches off the floor. Hold for 5 seconds and then lower slowly.  ?? Do 8 to 12 repetitions.    Seated Toe Taps    1. Sit in chair with knee flexed to 90 degrees  2. Slowly flex the foot up, leaving the heel in contact with the ground, then slowly tap the foot to the ground.  3. Repeat this activity for 30 repetitions several times throughout the day.      Heel raises    1. Stand with your feet a few inches apart, with your hands lightly resting on a counter or chair in front of you.  2. Slowly raise your heels off the floor while keeping your knees straight.  3. Hold for about 6 seconds, then slowly lower your heels to the floor.  4. Do 8 to 12 repetitions several times during the day.      Wall squats with ball    Note: You will need a large therapy ball for this exercise. Ask your doctor or physical therapist what size you will need, but it should be large enough to cover your back.  1. Stand with your back facing a wall. Place your feet about a shoulder-width apart.  2. Place the therapy ball between your back and the wall, and move your feet out in front of you so they are about a foot in front of your hips.  3. Keep your arms at your sides, or put your hands on your hips.  4. Slowly squat down as if you are going to sit in a chair, rolling your back over the ball as you squat. The ball should move with you but stay pressed into the wall.  5. Be sure that your knees do not go in front of your toes as you squat.  6. Hold for 6 seconds.  7. Slowly rise to your standing position.  8. Repeat 8 to 12 times.    Low Back Pain: Exercises  Your Care Instructions  Here are some examples of typical rehabilitation exercises for your condition. Start each exercise slowly. Ease off the exercise if you start to have pain.  Your doctor or physical therapist will tell you when you can start these exercises and which ones will work best for you.  How to do the exercises  Press-up    1. Lie on your stomach, supporting your body with your forearms.  2. Press your elbows down into the floor to raise your upper back. As you do this, relax your stomach muscles and allow your back to arch without using your back muscles. As your press up, do not let your hips or pelvis come off the floor.  3. Hold for 15 to 30 seconds, then relax.  4. Repeat 2 to 4 times.  Alternate arm and leg (bird dog) exercise    1. Start on the floor, on your hands and knees.  2. Tighten your  belly muscles.  3. Raise one leg off the floor, and hold it straight out behind you. Be careful not to let your hip drop down, because that will twist your trunk.  4. Hold for about 6 seconds, then lower your leg and switch to the other leg.  5. Repeat 8 to 12 times on each leg.  6. Over time, work up to holding for 10 to 30 seconds each time.  7. If you feel stable and secure with your leg raised, try raising the opposite arm straight out in front of you at the same time.  Knee-to-chest exercise    1. Lie on your back with your knees bent and your feet flat on the floor.  2. Bring one knee to your chest, keeping the other foot flat on the floor (or keeping the other leg straight, whichever feels better on your lower back).  3. Keep your lower back pressed to the floor. Hold for at least 15 to 30 seconds.  4. Relax, and lower the knee to the starting position.  5. Repeat with the other leg. Repeat 2 to 4 times with each leg.  6. To get more stretch, put your other leg flat on the floor while pulling your knee to your chest.    Hip flexor stretch    1. Kneel on the floor with one knee bent and one leg behind you. Place your forward knee over your foot. Keep your other knee touching the floor.  2. Slowly push your hips forward until you feel a stretch in the upper thigh of your rear leg.  3. Hold the stretch for at least 15 to 30 seconds. Repeat with your other leg.  4. Do 2 to 4 times on each side.

## 2019-07-12 NOTE — Unmapped (Signed)
[facility] Refill Coordination Note    Specialty Medication(s) to be Shipped:   Inflammatory Disorders: Humira    Other medication(s) to be shipped: N/A     Amy [name], DOB: 1961-01-19  Phone: [phone] (home)       All above HIPAA information was verified with patient.     Was a Nurse, learning disability used for this call? No    Completed refill call assessment today to schedule patient's medication shipment from the [facility] [phone]).       Specialty medication(s) and dose(s) confirmed: Regimen is correct and unchanged.   Changes to medications: [name] reports no changes at this time.  Changes to insurance: No  Questions for the pharmacist: No    Confirmed patient received Welcome Packet with first shipment. The patient will receive a drug information handout for each medication shipped and additional FDA Medication Guides as required.       DISEASE/MEDICATION-SPECIFIC INFORMATION        For patients on injectable medications: Patient currently has 0 doses left.  Next injection is scheduled for 07/24/2019.    SPECIALTY MEDICATION ADHERENCE     Medication Adherence    Patient reported X missed doses in the last month: 0  Specialty Medication: Humira 40 mg/0.8 ml  Patient is on additional specialty medications: No          HUMIRA(CF) PEN 40 mg/0.4 mL injectio: 12 days of medicine on hand     SHIPPING     Shipping address confirmed in Epic.     Delivery Scheduled: Yes, Expected medication delivery date: 07/19/2019.     Medication will be delivered via Same Day Courier to the prescription address in Epic WAM.    Oretha Milch   [facility] Specialty Technician

## 2019-07-19 MED FILL — HUMIRA PEN CITRATE FREE 40 MG/0.4 ML: 28 days supply | Qty: 2 | Fill #4 | Status: AC

## 2019-07-19 MED FILL — HUMIRA PEN CITRATE FREE 40 MG/0.4 ML: SUBCUTANEOUS | 28 days supply | Qty: 2 | Fill #4

## 2019-08-08 NOTE — Unmapped (Signed)
[facility] Refill Coordination Note    Specialty Medication(s) to be Shipped:   Inflammatory Disorders: Humira    Other medication(s) to be shipped: N/A     Menaal Fielden, DOB: 13-May-1961  Phone: [phone] (home)       All above HIPAA information was verified with patient.     Was a Nurse, learning disability used for this call? No    Completed refill call assessment today to schedule patient's medication shipment from the [facility] [phone]).       Specialty medication(s) and dose(s) confirmed: Regimen is correct and unchanged.   Changes to medications: Evamae reports no changes at this time.  Changes to insurance: No  Questions for the pharmacist: No    Confirmed patient received Welcome Packet with first shipment. The patient will receive a drug information handout for each medication shipped and additional FDA Medication Guides as required.       DISEASE/MEDICATION-SPECIFIC INFORMATION        For patients on injectable medications: Patient currently has 0 doses left.  Next injection is scheduled for 08/21/2019.    SPECIALTY MEDICATION ADHERENCE     Medication Adherence    Patient reported X missed doses in the last month: 0  Specialty Medication: Humira 40 mg/0.8 ml  Patient is on additional specialty medications: No  Any gaps in refill history greater than 2 weeks in the last 3 months: no  Demonstrates understanding of importance of adherence: yes  Informant: patient  Reliability of informant: reliable  Confirmed plan for next specialty medication refill: delivery by pharmacy  Refills needed for supportive medications: not needed          HUMIRA(CF) PEN 40 mg/0.4 mL injectio: 0 days of medicine on hand     SHIPPING     Shipping address confirmed in Epic.     Delivery Scheduled: Yes, Expected medication delivery date: 08/15/2019.     Medication will be delivered via Same Day Courier to the prescription address in Epic WAM.    Sanoe Hazan D Renette Hsu   [facility] Specialty Technician

## 2019-08-15 MED FILL — HUMIRA PEN CITRATE FREE 40 MG/0.4 ML: SUBCUTANEOUS | 28 days supply | Qty: 2 | Fill #5

## 2019-08-15 MED FILL — HUMIRA PEN CITRATE FREE 40 MG/0.4 ML: 28 days supply | Qty: 2 | Fill #5 | Status: AC

## 2019-09-06 NOTE — Unmapped (Signed)
Catarina reports things are going well with her Humira - she was on a course of Cipro recently for a UTI, and her elbow psoriasis cleared up. She occasionally has to use topicals on her elbows, but finds things are improved from the the last time she visited derm clinic.     [facility] Clinical Assessment & Refill Coordination Note    Ronnetta Frappier, DOB: [birth year]  Phone: [phone] (home)     All above HIPAA information was verified with patient.     Was a Nurse, learning disability used for this call? No    Specialty Medication(s):   Inflammatory Disorders: Humira     Current Outpatient Medications   Medication Sig Dispense Refill   ??? ADALIMUMAB PEN CITRATE FREE 40 MG/0.4 ML Inject the contents of 1 pen (40 mg total) under the skin every fourteen (14) days. 6 each 36   ??? aspirin (ECOTRIN) 81 MG tablet Take 81 mg by mouth daily.     ??? betamethasone dipropionate (DIPROLENE) 0.05 % ointment   4   ??? calcipotriene (DOVONEX) 0.005 % cream Apply topically Two (2) times a day. 60 g 2   ??? cholecalciferol, vitamin D3, (VITAMIN D3) 1,000 unit capsule Take 1,000 Units by mouth daily.     ??? clobetasol (TEMOVATE) 0.05 % ointment Apply to psoriasis rash 2 times daily as needed for rash. At night, apply medicine and then wrap in long sleeves or saran wrap (Patient not taking: Reported on 08/05/2019) 60 g 5   ??? empty container Misc Use as directed to dispose of Humira pens. 1 each 2   ??? insulin ASPART (NOVOLOG) 100 unit/mL injection Inject under the skin Three (3) times a day before meals. 10 mL 12   ??? insulin glargine (LANTUS) 100 unit/mL injection Inject 80 Units under the skin nightly.     ??? lisinopril (PRINIVIL,ZESTRIL) 10 MG tablet Take 10 mg by mouth daily.     ??? pregabalin (LYRICA) 25 MG capsule      ??? pregabalin (LYRICA) 50 MG capsule Take 1 capsule by mouth 3 (three) times a day.  0   ??? rosuvastatin (CRESTOR) 10 MG tablet Take by mouth.     ??? rosuvastatin calcium (CRESTOR ORAL) Take by mouth.     ??? sodium bicarbonate 650 mg tablet Take 1 tablet (650 mg total) by mouth Two (2) times a day. 60 tablet 11   ??? triamcinolone (KENALOG) 0.1 % ointment Apply topically Two (2) times a day. 453.6 g 0   ??? TRUEPLUS PEN NEEDLE 32 gauge x 5/32 Ndle   10     No current facility-administered medications for this visit.        Changes to medications: Palmer reports no changes at this time.    Allergies   Allergen Reactions   ??? Sulfur-8 Nausea Only   ??? Glucophage [Metformin]    ??? Codeine Itching   ??? Morphine Itching       Changes to allergies: No    SPECIALTY MEDICATION ADHERENCE     Humira - 0 left  Medication Adherence    Patient reported X missed doses in the last month: 0  Specialty Medication: Humira  Patient is on additional specialty medications: No          Specialty medication(s) dose(s) confirmed: Regimen is correct and unchanged.     Are there any concerns with adherence? No    Adherence counseling provided? Not needed    CLINICAL MANAGEMENT AND INTERVENTION  Clinical Benefit Assessment:    Do you feel the medicine is effective or helping your condition? Yes    Clinical Benefit counseling provided? Not needed    Adverse Effects Assessment:    Are you experiencing any side effects? No    Are you experiencing difficulty administering your medicine? No    Quality of Life Assessment:    How many days over the past month did your psoriasis  keep you from your normal activities? For example, brushing your teeth or getting up in the morning. 0    Have you discussed this with your provider? Not needed    Therapy Appropriateness:    Is therapy appropriate? Yes, therapy is appropriate and should be continued    DISEASE/MEDICATION-SPECIFIC INFORMATION      For patients on injectable medications: Patient currently has 0 doses left.  Next injection is scheduled for Wed, May 5.    PATIENT SPECIFIC NEEDS     - Does the patient have any physical, cognitive, or cultural barriers? No    - Is the patient high risk? No     - Does the patient require a Care Management Plan? No     - Does the patient require physician intervention or other additional services (i.e. nutrition, smoking cessation, social work)? No      SHIPPING     Specialty Medication(s) to be Shipped:   Inflammatory Disorders: Humira    Other medication(s) to be shipped: na     Changes to insurance: No    Delivery Scheduled: Yes, Expected medication delivery date: Thurs, April 29.     Medication will be delivered via Same Day Courier to the confirmed prescription address in [facility].    The patient will receive a drug information handout for each medication shipped and additional FDA Medication Guides as required.  Verified that patient has previously received a Conservation officer, historic buildings.    All of the patient's questions and concerns have been addressed.    Lanney Gins   [facility] Shared [facility] Specialty Pharmacist

## 2019-09-12 MED FILL — HUMIRA PEN CITRATE FREE 40 MG/0.4 ML: 28 days supply | Qty: 2 | Fill #6 | Status: AC

## 2019-09-12 MED FILL — HUMIRA PEN CITRATE FREE 40 MG/0.4 ML: SUBCUTANEOUS | 28 days supply | Qty: 2 | Fill #6

## 2019-09-26 MED ORDER — NYSTATIN 100,000 UNIT/ML ORAL SUSPENSION
0 days
Start: 2019-09-26 — End: ?

## 2019-10-02 NOTE — Unmapped (Signed)
[facility] Refill Coordination Note    Specialty Medication(s) to be Shipped:   Inflammatory Disorders: Humira    Other medication(s) to be shipped: N/A     Amy [name], DOB: 1961/04/25  Phone: [phone] (home)       All above HIPAA information was verified with patient.     Was a Nurse, learning disability used for this call? No    Completed refill call assessment today to schedule patient's medication shipment from the [facility] [phone]).       Specialty medication(s) and dose(s) confirmed: Regimen is correct and unchanged.   Changes to medications: Amy [name] reports no changes at this time.  Changes to insurance: No  Questions for the pharmacist: No    Confirmed patient received Welcome Packet with first shipment. The patient will receive a drug information handout for each medication shipped and additional FDA Medication Guides as required.       DISEASE/MEDICATION-SPECIFIC INFORMATION        For patients on injectable medications: Patient currently has 1 doses left.  Next injection is scheduled for 10/02/2019.    SPECIALTY MEDICATION ADHERENCE     Medication Adherence    Patient reported X missed doses in the last month: 0  Specialty Medication: Humira 40 mg/0.8 ml   Patient is on additional specialty medications: No  Any gaps in refill history greater than 2 weeks in the last 3 months: no  Demonstrates understanding of importance of adherence: yes  Informant: patient  Reliability of informant: reliable  Confirmed plan for next specialty medication refill: delivery by pharmacy  Refills needed for supportive medications: not needed          HUMIRA(CF) PEN 40 mg/0.4 mL injection: 14 days of medicine on hand     SHIPPING     Shipping address confirmed in Epic.     Delivery Scheduled: Yes, Expected medication delivery date: 10/09/2019.     Medication will be delivered via Same Day Courier to the prescription address in Epic WAM.    [name] D Saira Kramme   [facility] Shared [facility] Specialty Technician

## 2019-10-09 MED FILL — HUMIRA PEN CITRATE FREE 40 MG/0.4 ML: 28 days supply | Qty: 2 | Fill #7 | Status: AC

## 2019-10-09 MED FILL — HUMIRA PEN CITRATE FREE 40 MG/0.4 ML: SUBCUTANEOUS | 28 days supply | Qty: 2 | Fill #7

## 2019-10-30 NOTE — Unmapped (Signed)
[facility] Refill Coordination Note    Specialty Medication(s) to be Shipped:   Inflammatory Disorders: Humira    Other medication(s) to be shipped: none     Amy [name], DOB: April 21, 1961  Phone: [phone] (home)       All above HIPAA information was verified with patient.     Was a Nurse, learning disability used for this call? No    Completed refill call assessment today to schedule patient's medication shipment from the [facility] [phone]).       Specialty medication(s) and dose(s) confirmed: Regimen is correct and unchanged.   Changes to medications: Tiana reports no changes at this time.  Changes to insurance: No  Questions for the pharmacist: No    Confirmed patient received Welcome Packet with first shipment. The patient will receive a drug information handout for each medication shipped and additional FDA Medication Guides as required.       DISEASE/MEDICATION-SPECIFIC INFORMATION        For patients on injectable medications: Patient currently has 1 doses left.  Next injection is scheduled for 10/31/19.    SPECIALTY MEDICATION ADHERENCE     Medication Adherence    Patient reported X missed doses in the last month: 0  Specialty Medication: Humira 40mg /0.28ml  Patient is on additional specialty medications: No                Humira 40/0.4 mg/ml: 1 days of medicine on hand          SHIPPING     Shipping address confirmed in Epic.     Delivery Scheduled: Yes, Expected medication delivery date: 11/07/19.     Medication will be delivered via Same Day Courier to the prescription address in Epic WAM.    Unk Lightning   [facility] Specialty Technician

## 2019-11-04 NOTE — Unmapped (Signed)
DERMATOLOGY CLINIC NOTE    ASSESSMENT AND PLAN:     Psoriasis, <5% BSA involvement: Mildly flaring today  - Previously tried [place] and Humira with minimal improvement and concern for increased risk of infection with the Humira. Thus, joint decision was made to proceed with the following:  - Stop Humira  - Start clobetasol 0.05% ointment BID to thicker plaques until clear  - Continue TAC 0.1% ointment BID to thinner plaques     Return to clinic: 6 months       CHIEF COMPLAINT:  Psoriasis     HPI:   This is a pleasant 59 y.o. female who last saw Dr. Linton Rump on 06/18/2019.  She has psoriasis with less than 5% body surface area involvement which has been difficult to treat with Humira and topical steroids.  Prior treatment have also included Otezla.  At last visit we continue the Humira every 14 days and also Zyrtec for her pruritus as well as topical triamcinolone and topical calcipotriene.  For history, she was first seen by [place] on 02/08/2017 and was initially started on clobetasol only.  She was subsequently started on apremilast on 09/25/2017.  She was started on Humira on 12/04/2018.  She had a biopsy on 12/04/2018 which was suggestive of psoriasis.  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  She had a negative PPD test on 02/13/2019.    Today, she reports that her psoriasis started flaring mildly on her elbows 4 weeks ago. She stopped her Humira 3 weeks ago given perception of no significant improvement and concern for decrease in her immune system. She had a UTI recently and took some cipro for it, and subsequently developed thrush. She admits to not using her topicals regularly.     PAST MEDICAL HISTORY:  Diabetes  HTN  Alport's disease  No history of skin cancer    MEDICATIONS:   Current Outpatient Medications on File Prior to Visit   Medication Sig Dispense Refill   ??? ADALIMUMAB PEN CITRATE FREE 40 MG/0.4 ML Inject the contents of 1 pen (40 mg total) under the skin every fourteen (14) days. 6 each 36   ??? aspirin (ECOTRIN) 81 MG tablet Take 81 mg by mouth daily.     ??? betamethasone dipropionate (DIPROLENE) 0.05 % ointment   4   ??? calcipotriene (DOVONEX) 0.005 % cream Apply topically Two (2) times a day. 60 g 2   ??? cholecalciferol, vitamin D3, (VITAMIN D3) 1,000 unit capsule Take 1,000 Units by mouth daily.     ??? clobetasol (TEMOVATE) 0.05 % ointment Apply to psoriasis rash 2 times daily as needed for rash. At night, apply medicine and then wrap in long sleeves or saran wrap (Patient not taking: Reported on 08/05/2019) 60 g 5   ??? empty container Misc Use as directed to dispose of Humira pens. 1 each 2   ??? insulin ASPART (NOVOLOG) 100 unit/mL injection Inject under the skin Three (3) times a day before meals. 10 mL 12   ??? insulin glargine (LANTUS) 100 unit/mL injection Inject 80 Units under the skin nightly.     ??? lisinopril (PRINIVIL,ZESTRIL) 10 MG tablet Take 10 mg by mouth daily.     ??? nystatin (MYCOSTATIN) 100,000 unit/mL suspension      ??? pregabalin (LYRICA) 25 MG capsule      ??? pregabalin (LYRICA) 50 MG capsule Take 1 capsule by mouth 3 (three) times a day.  0   ??? rosuvastatin (CRESTOR) 10 MG  tablet Take by mouth.     ??? rosuvastatin calcium (CRESTOR ORAL) Take by mouth.     ??? sodium bicarbonate 650 mg tablet Take 1 tablet (650 mg total) by mouth Two (2) times a day. 60 tablet 11   ??? triamcinolone (KENALOG) 0.1 % ointment Apply topically Two (2) times a day. 453.6 g 0   ??? TRUEPLUS PEN NEEDLE 32 gauge x 5/32 Ndle   10     No current facility-administered medications on file prior to visit.       ALLERGIES:   Sulfur  Metformin  Codeine  Morphine    REVIEW OF SYSTEMS:  Baseline state of health. No recent illnesses. No other skin complaints.     PHYSICAL EXAMINATION:  Examination in the presence of female chaperone:  General: Well-developed, well-nourished. No acute distress.   Neuro: Alert and oriented, answers questions appropriately.  Skin: Examination of the scalp, face, head, neck, chest, back, upper extremities, lower extremities, hands, palms, soles was performed and notable for the following:  - There are several well demarcated pink papules with micaceous scaling on the bl elbow c/w psoriasis  - Splitting of distal aspect of all fingernails, c/w onycholysis  - Scalp clear  - All other areas examined were normal or had no significant findings     The patient was seen and examined by Abbie Sons, MD who agrees with the assessment and plan as above.

## 2019-11-05 ENCOUNTER — Ambulatory Visit: Admit: 2019-11-05 | Discharge: 2019-11-06 | Payer: PRIVATE HEALTH INSURANCE

## 2019-11-05 DIAGNOSIS — L409 Psoriasis, unspecified: Principal | ICD-10-CM

## 2019-11-05 MED ORDER — CLOBETASOL 0.05 % TOPICAL OINTMENT
5 refills | 0 days | Status: CP
Start: 2019-11-05 — End: ?

## 2019-11-05 NOTE — Unmapped (Signed)
This patient has been disenrolled from the [facility] specialty pharmacy services due to therapy completion - expected therapy completion date: 11/05/19 - per dermatology clinic notes.Tawanna Solo [facility] Specialty Pharmacist

## 2019-11-05 NOTE — Unmapped (Signed)
Patient called in to let us know that she is no longer taking Humira. Per Dr Paulla Dolly 11/05/2019 note the plan is to have patient stop Humira.

## 2019-11-05 NOTE — Unmapped (Signed)
Clobetasol is for your thick areas  Triamcinolone is for the thinner areas

## 2020-01-12 IMAGING — MG DIGITAL SCREENING BILATERAL MAMMOGRAM WITH TOMO AND CAD
8 of 11 series · 8 of 27 positions shown · non-contrast
Comparison: Previous exam(s).

CLINICAL DATA: Screening.

EXAM:
DIGITAL SCREENING BILATERAL MAMMOGRAM WITH TOMO AND CAD

[L CC synth-2D]
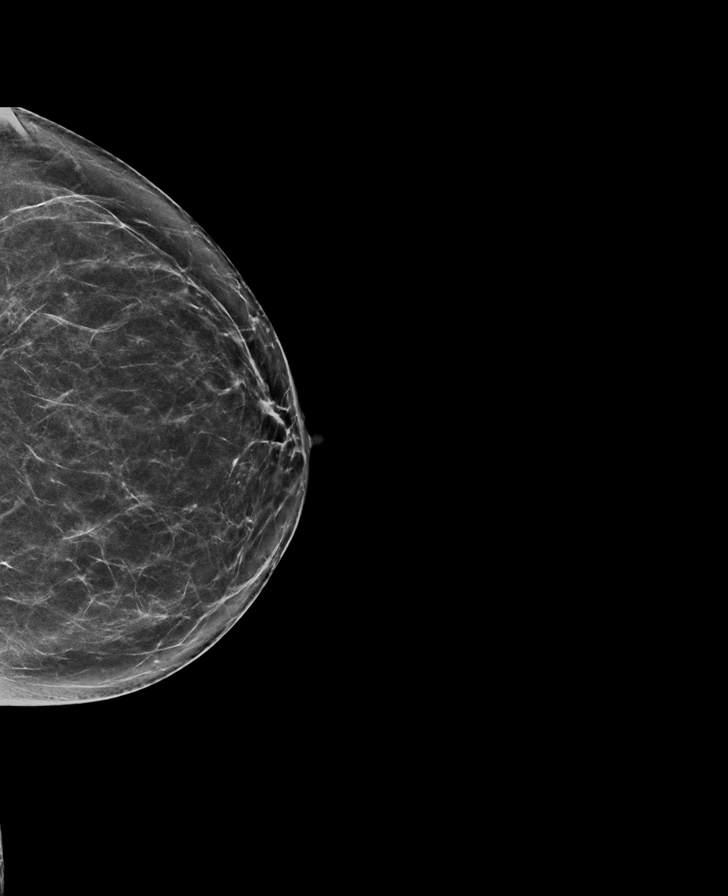

[R MLO synth-2D]
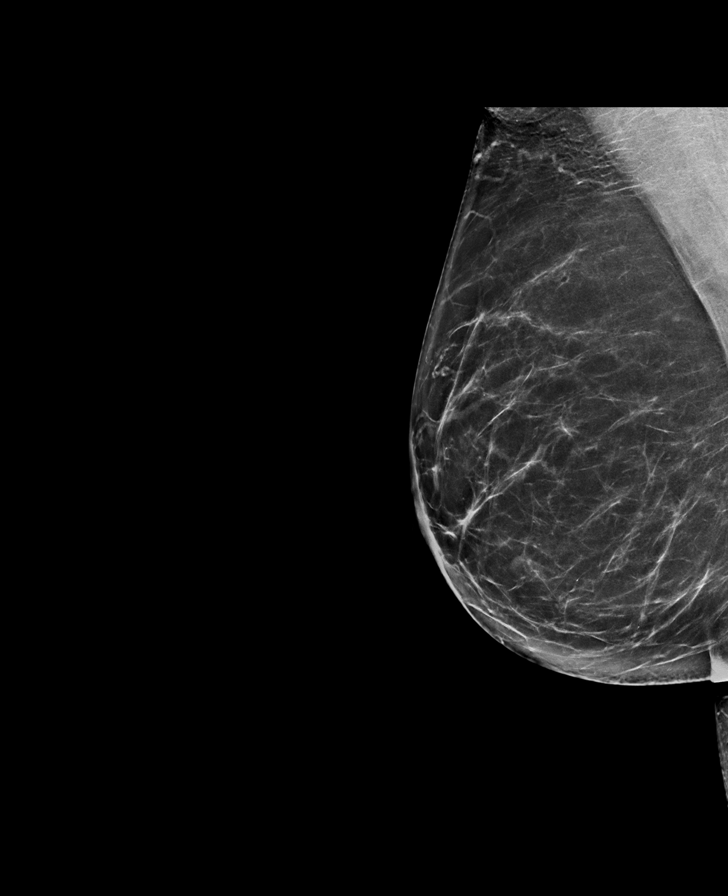

[R CC synth-2D]
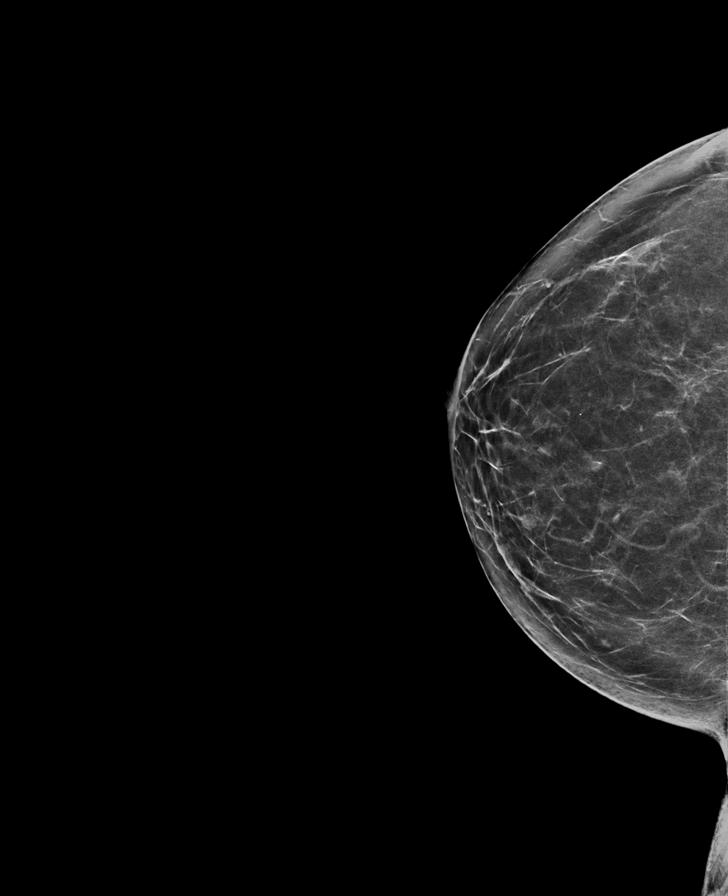

[L MLO synth-2D]
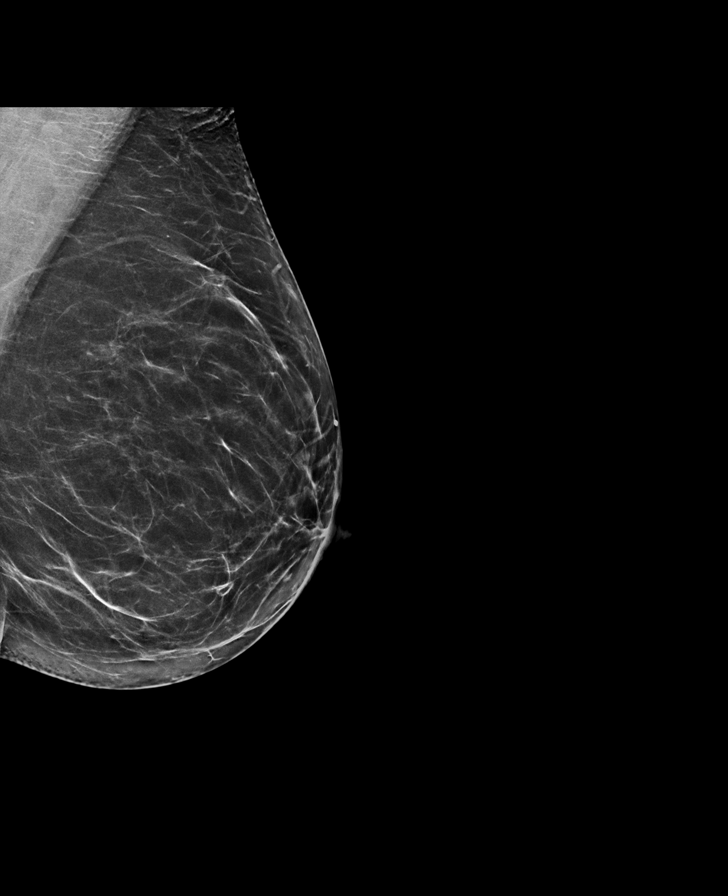

[L CC tomo · tomo slice 35/68.0]
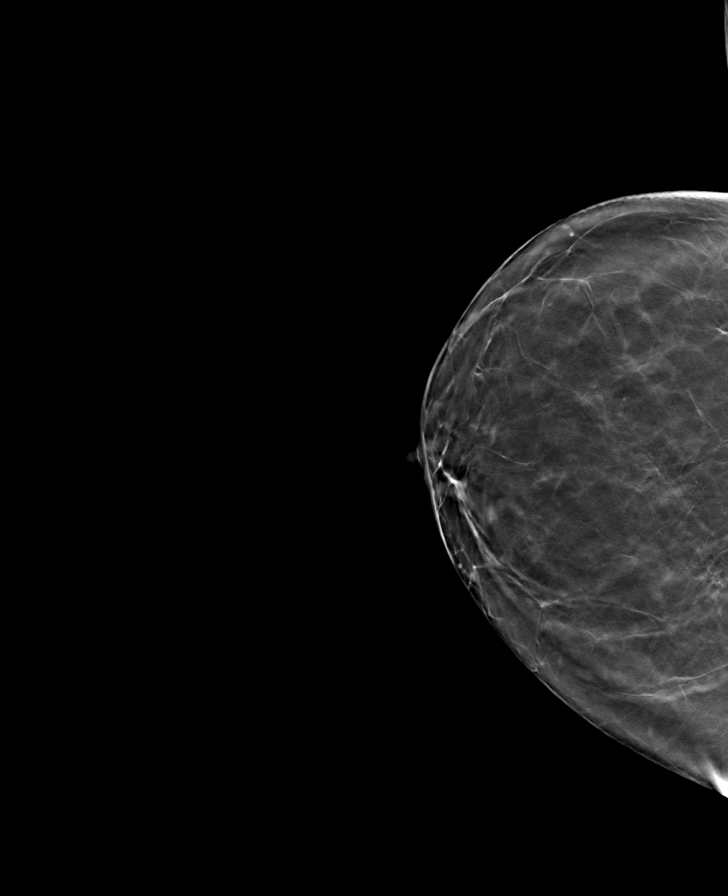

[R CC (1 of 2)]
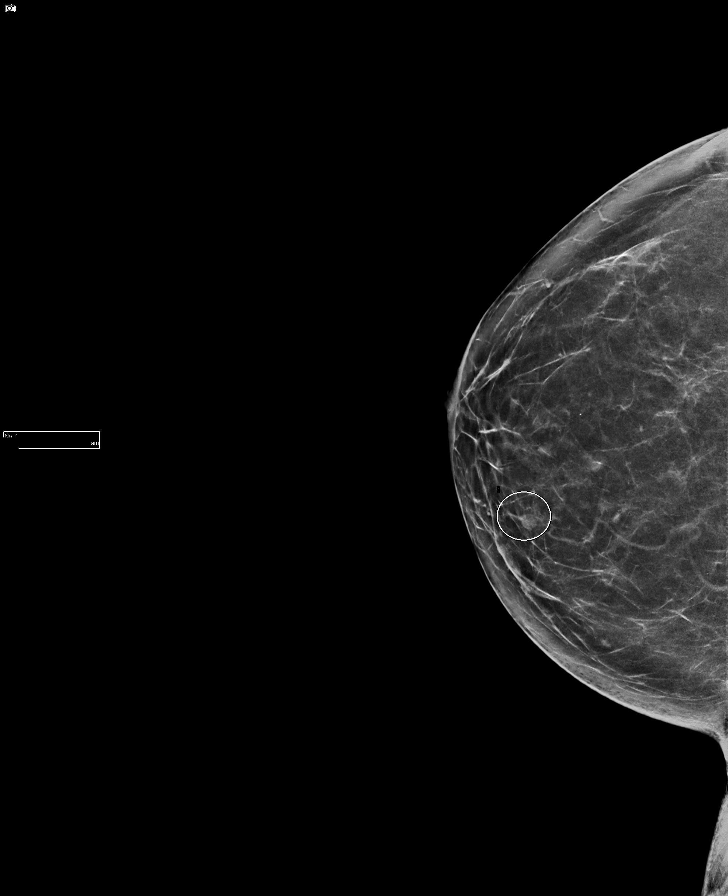

[R MLO]
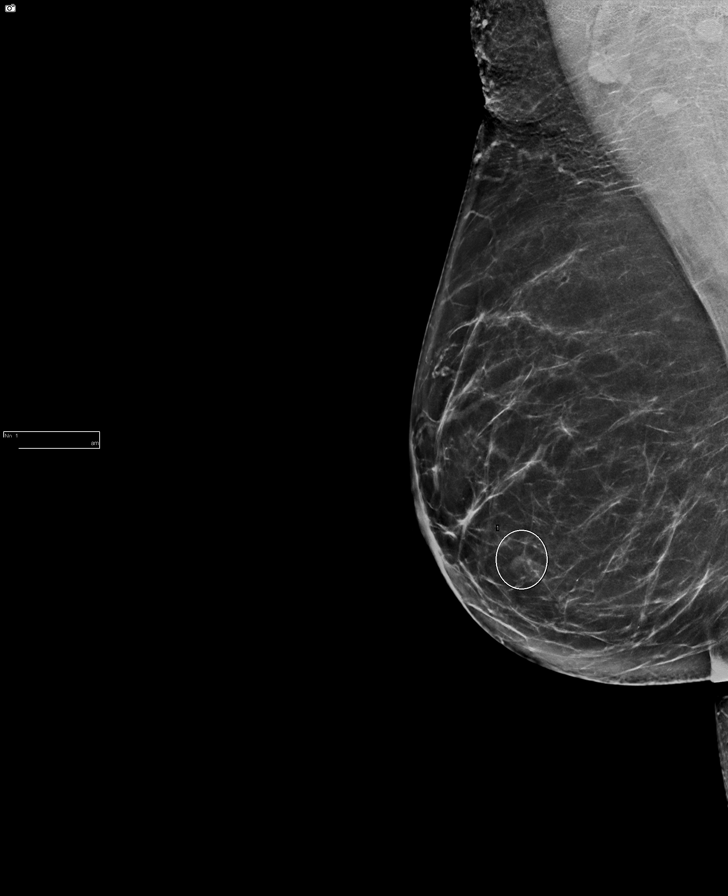

[R CC (2 of 2)]
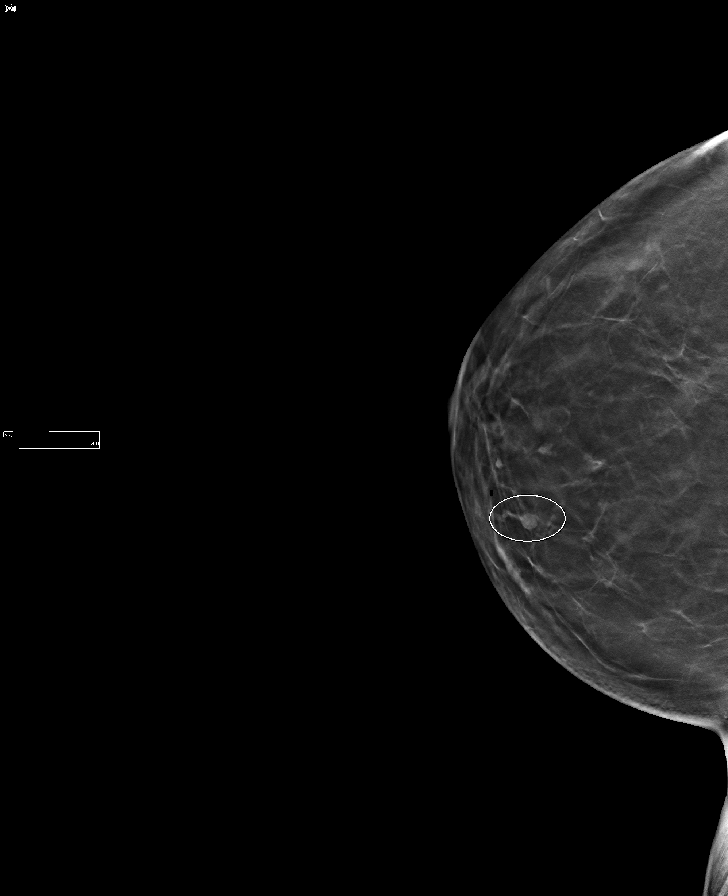

[8 of 27 positions shown; findings below may reference images not displayed]

ACR Breast Density Category b: There are scattered areas of
fibroglandular density.
FINDINGS: In the right breast, a possible mass warrants further evaluation.
This possible mass is seen within the inner RIGHT breast, at
anterior depth, tomosynthesis CC slice 44, possible correlate within
the inferior RIGHT breast on the MLO projection.

In the left breast, no findings suspicious for malignancy. Images
were processed with CAD.
IMPRESSION: Further evaluation is suggested for possible mass in the right
breast.

RECOMMENDATION:
Diagnostic mammogram and possibly ultrasound of the right breast.
(Code:[id number])

The patient will be contacted regarding the findings, and additional
imaging will be scheduled.

BI-RADS CATEGORY  0: Incomplete. Need additional imaging evaluation
and/or prior mammograms for comparison.

## 2020-02-18 ENCOUNTER — Encounter
Admit: 2020-02-18 | Discharge: 2020-02-19 | Payer: PRIVATE HEALTH INSURANCE | Attending: Rehabilitative and Restorative Service Providers" | Primary: Rehabilitative and Restorative Service Providers"

## 2020-02-23 ENCOUNTER — Ambulatory Visit
Admit: 2020-02-23 | Discharge: 2020-02-23 | Disposition: A | Discharge status: Home / Self Care | Payer: PRIVATE HEALTH INSURANCE | Attending: Student in an Organized Health Care Education/Training Program

## 2020-02-23 ENCOUNTER — Encounter
Admit: 2020-02-23 | Discharge: 2020-02-23 | Disposition: A | Discharge status: Home / Self Care | Payer: PRIVATE HEALTH INSURANCE | Attending: Student in an Organized Health Care Education/Training Program

## 2020-02-23 DIAGNOSIS — M549 Dorsalgia, unspecified: Principal | ICD-10-CM

## 2020-02-23 MED ORDER — DIAZEPAM 2 MG TABLET: 2 mg | tablet | Freq: Two times a day (BID) | 0 refills | 5 days | Status: AC

## 2020-02-23 MED ORDER — IBUPROFEN 800 MG TABLET: 800 mg | tablet | Freq: Three times a day (TID) | 0 refills | 10 days | Status: AC

## 2020-02-23 MED ORDER — DIAZEPAM 2 MG TABLET
ORAL_TABLET | Freq: Two times a day (BID) | ORAL | 0 refills | 5.00000 days | Status: CP
Start: 2020-02-23 — End: 2020-02-28

## 2020-02-23 MED ORDER — LIDOCAINE 4 % TOPICAL PATCH
MEDICATED_PATCH | Freq: Every day | TRANSDERMAL | 0 refills | 7.00000 days | Status: CP
Start: 2020-02-23 — End: 2020-02-23

## 2020-02-23 MED ORDER — IBUPROFEN 800 MG TABLET
ORAL_TABLET | Freq: Three times a day (TID) | ORAL | 0 refills | 10.00000 days | Status: CP | PRN
Start: 2020-02-23 — End: 2020-02-23

## 2020-02-23 MED ORDER — LIDOCAINE 4 % TOPICAL PATCH: 1 | patch | Freq: Every day | 0 refills | 7 days | Status: AC

## 2020-02-24 ENCOUNTER — Ambulatory Visit: Admit: 2020-02-24 | Discharge: 2020-02-25 | Payer: PRIVATE HEALTH INSURANCE

## 2020-02-28 ENCOUNTER — Institutional Professional Consult (permissible substitution)
Admit: 2020-02-28 | Discharge: 2020-02-29 | Payer: PRIVATE HEALTH INSURANCE | Attending: Rehabilitative and Restorative Service Providers" | Primary: Rehabilitative and Restorative Service Providers"

## 2020-04-13 ENCOUNTER — Encounter: Admit: 2020-04-13 | Discharge: 2020-04-14 | Payer: PRIVATE HEALTH INSURANCE

## 2020-04-13 DIAGNOSIS — M5412 Radiculopathy, cervical region: Principal | ICD-10-CM

## 2020-04-13 DIAGNOSIS — M545 Chronic midline low back pain without sciatica: Principal | ICD-10-CM

## 2020-04-13 DIAGNOSIS — G8929 Other chronic pain: Principal | ICD-10-CM

## 2020-04-13 DIAGNOSIS — M47812 Spondylosis without myelopathy or radiculopathy, cervical region: Principal | ICD-10-CM

## 2020-04-13 MED ORDER — METHOCARBAMOL 500 MG TABLET
ORAL_TABLET | Freq: Three times a day (TID) | ORAL | 0 refills | 10 days | Status: CP | PRN
Start: 2020-04-13 — End: ?

## 2020-06-02 MED ORDER — NOVOLOG FLEXPEN U-100 INSULIN ASPART 100 UNIT/ML (3 ML) SUBCUTANEOUS
0.00000 days
Start: 2020-06-02 — End: 2020-06-17

## 2020-06-10 DIAGNOSIS — N184 Chronic kidney disease, stage 4 (severe): Principal | ICD-10-CM

## 2020-06-10 DIAGNOSIS — E559 Vitamin D deficiency, unspecified: Principal | ICD-10-CM

## 2020-06-10 DIAGNOSIS — I10 Essential (primary) hypertension: Principal | ICD-10-CM

## 2020-06-10 MED ORDER — SODIUM BICARBONATE 650 MG TABLET
ORAL_TABLET | Freq: Two times a day (BID) | ORAL | 0 refills | 30.00000 days | Status: CP
Start: 2020-06-10 — End: 2021-06-10

## 2020-06-16 ENCOUNTER — Encounter: Admit: 2020-06-16 | Discharge: 2020-06-16 | Payer: PRIVATE HEALTH INSURANCE

## 2020-06-16 ENCOUNTER — Ambulatory Visit: Admit: 2020-06-16 | Discharge: 2020-06-16 | Payer: PRIVATE HEALTH INSURANCE

## 2020-06-16 DIAGNOSIS — I10 Essential (primary) hypertension: Principal | ICD-10-CM

## 2020-06-16 DIAGNOSIS — Z79899 Other long term (current) drug therapy: Principal | ICD-10-CM

## 2020-06-16 DIAGNOSIS — E559 Vitamin D deficiency, unspecified: Principal | ICD-10-CM

## 2020-06-16 DIAGNOSIS — N184 Chronic kidney disease, stage 4 (severe): Principal | ICD-10-CM

## 2020-06-16 DIAGNOSIS — L409 Psoriasis, unspecified: Principal | ICD-10-CM

## 2020-06-16 MED ORDER — CLOBETASOL 0.05 % TOPICAL OINTMENT
OPHTHALMIC | 5 refills | 0.00000 days | Status: CP
Start: 2020-06-16 — End: ?

## 2020-06-16 MED ORDER — USTEKINUMAB 90 MG/ML SUBCUTANEOUS SYRINGE
SUBCUTANEOUS | 4 refills | 0.00000 days | Status: CP
Start: 2020-06-16 — End: ?

## 2020-06-16 MED ORDER — TRIAMCINOLONE ACETONIDE 0.1 % TOPICAL OINTMENT
Freq: Two times a day (BID) | TOPICAL | 1 refills | 0.00000 days | Status: CP
Start: 2020-06-16 — End: 2021-06-16

## 2020-06-17 ENCOUNTER — Encounter: Admit: 2020-06-17 | Discharge: 2020-06-18 | Payer: PRIVATE HEALTH INSURANCE

## 2020-06-17 DIAGNOSIS — N184 Chronic kidney disease, stage 4 (severe): Principal | ICD-10-CM

## 2020-06-17 DIAGNOSIS — N2581 Secondary hyperparathyroidism of renal origin: Principal | ICD-10-CM

## 2020-06-17 DIAGNOSIS — D631 Anemia in chronic kidney disease: Principal | ICD-10-CM

## 2020-06-17 DIAGNOSIS — Q8781 Alport syndrome: Principal | ICD-10-CM

## 2020-06-17 MED ORDER — SODIUM BICARBONATE 650 MG TABLET
ORAL_TABLET | Freq: Two times a day (BID) | ORAL | 3 refills | 90 days | Status: CP
Start: 2020-06-17 — End: 2021-06-17

## 2020-06-23 DIAGNOSIS — L409 Psoriasis, unspecified: Principal | ICD-10-CM

## 2020-06-23 MED ORDER — STELARA 45 MG/0.5 ML SUBCUTANEOUS SYRINGE
SUBCUTANEOUS | 1 refills | 0.00000 days | Status: CP
Start: 2020-06-23 — End: ?
  Filled 2020-07-09: qty 0.5, 28d supply, fill #0

## 2020-07-06 NOTE — Unmapped (Signed)
[facility] Specialty Medication Onboarding    Specialty Medication: Stelara 45mg /0.67ml syringes  Prior Authorization: Approved   Financial Assistance: Yes - copay card approved as secondary   Final Copay/Day Supply: $5 / 28 days (load)                                           $5/ 84 days (maintenance)      Insurance Restrictions: None     Notes to Pharmacist: Patient would like a call back tomorrow 2/22 for onboarding    The triage team has completed the benefits investigation and has determined that the patient is able to fill this medication at [facility]. Please contact the patient to complete the onboarding or follow up with the prescribing physician as needed.

## 2020-07-08 NOTE — Unmapped (Signed)
[facility]   Patient Onboarding/Medication Counseling    Amy [name] is a 60 y.o. female with psoriasis who I am counseling today on initiation of therapy.  I am speaking to the patient.    Was a Nurse, learning disability used for this call? No    Verified patient's date of birth / HIPAA.    Specialty medication(s) to be sent: Inflammatory Disorders: Stelara      Non-specialty medications/supplies to be sent: na/ has sharps container from previous      Medications not needed at this time: na         Stelara (ustekinumab)    Medication & Administration     Dosage: Plaque psoriasis (over 12yo and 60-100kg): Inject 45mg  under the skin at weeks 0 and 4, then every 12 weeks thereafter    Lab tests required prior to treatment initiation:  ??? Tuberculosis: Tuberculosis screening resulted in a non-reactive Quantiferon TB Gold assay.    Administration:     Prefilled syringe  1. Gather all supplies needed for injection on a clean, flat working surface: medication syringe(s) removed from packaging, alcohol swab, sharps container, etc.  2. Look at the medication label ??? look for correct medication, correct dose, and check the expiration date  3. Look at the medication ??? the liquid in the syringe should appear clear and colorless to slightly yellow, you may see a few white particles  4. Lay the syringe on a flat surface and allow it to warm up to room temperature for at least 15-30 minutes  5. Select injection site ??? you can use the front of your thigh or your belly (but not the area 2 inches around your belly button); if someone else is giving you the injection you can also use your upper arm in the skin covering your triceps muscle or in the buttocks  6. Prepare injection site ??? wash your hands and clean the skin at the injection site with an alcohol swab and let it air dry, do not touch the injection site again before the injection  7. Pull off the needle safety cap, do not remove until immediately prior to injection  8. Pinch the skin ??? with your hand not holding the syringe pinch up a fold of skin at the injection site using your forefinger and thumb  9. Insert the needle into the fold of skin at about a 45 degree angle ??? it's best to use a quick dart-like motion  10. Push the plunger down slowly as far as it will go until the syringe is empty, if the plunger is not fully depressed the needle shield will not extend to cover the needle when it is removed, hold the syringe in place for a full 5 seconds  11. Check that the syringe is empty and keep pressing down on the plunger while you pull the needle out at the same angle as inserted; after the needle is removed completely from the skin, release the plunger allowing the needle shield to activate and cover the used needle  12. Dispose of the used syringe immediately in your sharps disposal container, do not attempt to recap the needle prior to disposing  13. If you see any blood at the injection site, press a cotton ball or gauze on the site and maintain pressure until the bleeding stops, do not rub the injection site      Adherence/Missed dose instructions:  If your injection is given more than 7-10 days after your scheduled injection  date ??? consult your pharmacist for additional instructions on how to adjust your dosing schedule.    Goals of Therapy     Plaque Psoriasis  ??? Minimize areas of skin involvement (% BSA)  ??? Avoidance of long term glucocorticoid use  ??? Maintenance of effective psychosocial functioning      Side Effects & Monitoring Parameters     ??? Injection site reaction (redness, irritation, inflammation localized to the site of administration)  ??? Signs of a common cold ??? minor sore throat, runny or stuffy nose, etc.  ??? Feeling tired or weak  ??? Headache    The following side effects should be reported to the provider:  ??? Signs of a hypersensitivity reaction ??? rash; hives; itching; red, swollen, blistered, or peeling skin; wheezing; tightness in the chest or throat; difficulty breathing, swallowing, or talking; swelling of the mouth, face, lips, tongue, or throat; etc.  ??? Reduced immune function ??? report signs of infection such as fever; chills; body aches; very bad sore throat; ear or sinus pain; cough; more sputum or change in color of sputum; pain with passing urine; wound that will not heal, etc.  Also at a slightly higher risk of some malignancies (mainly skin and blood cancers) due to this reduced immune function.  o In the case of signs of infection ??? the patient should hold the next dose of Stelara?? and call your primary care provider to ensure adequate medical care.  Treatment may be resumed when infection is treated and patient is asymptomatic.  ??? Changes in skin ??? a new growth or lump that forms; changes in shape, size, or color of a previous mole or marking  ??? Shortness of breath or chest pain  ??? Vaginal itching or discharge      Contraindications, Warnings, & Precautions     ??? Have your bloodwork checked as you have been told by your prescriber  ??? Talk with your doctor if you are pregnant, planning to become pregnant, or breastfeeding  ??? Discuss the possible need for holding your dose(s) of Stelara?? when a planned procedure is scheduled with the prescriber as it may delay healing/recovery timeline       Drug/Food Interactions     ??? Medication list reviewed in Epic. The patient was instructed to inform the care team before taking any new medications or supplements. No drug interactions identified.   ??? If you have a latex allergy use caution when handling, the needle cap of the Stelara?? prefilled syringe contains a derivative of natural rubber latex  ??? Talk with you prescriber or pharmacist before receiving any live vaccinations while taking this medication and after you stop taking it    Storage, Handling Precautions, & Disposal     ??? Store this medication in the refrigerator.  Do not freeze  ??? If needed, you may store at room temperature for up to 30 days  ??? Store in original packaging, protected from light  ??? Do not shake  ??? Dispose of used syringes/pens in a sharps disposal container            Current Medications (including OTC/herbals), Comorbidities and Allergies     Current Outpatient Medications   Medication Sig Dispense Refill   ??? aspirin (ECOTRIN) 81 MG tablet Take 81 mg by mouth daily.      ??? betamethasone dipropionate (DIPROLENE) 0.05 % ointment   4   ??? calcipotriene (DOVONEX) 0.005 % cream Apply topically Two (2) times a day. 60 g 2   ???  cholecalciferol, vitamin D3, (VITAMIN D3) 1,000 unit capsule Take 1,000 Units by mouth daily.      ??? clobetasoL (TEMOVATE) 0.05 % ointment Apply to your thick psoriasis areas twice daily until clear 60 g 5   ??? empty container Misc Use as directed to dispose of Humira pens. 1 each 2   ??? insulin ASPART (NOVOLOG) 100 unit/mL injection Inject under the skin Three (3) times a day before meals. 10 mL 12   ??? insulin glargine (LANTUS) 100 unit/mL injection Inject 80 Units under the skin nightly.     ??? lisinopril (PRINIVIL,ZESTRIL) 10 MG tablet Take 10 mg by mouth daily.     ??? methocarbamoL (ROBAXIN) 500 MG tablet Take 1 tablet (500 mg total) by mouth Three (3) times a day as needed for up to 30 doses. 30 tablet 0   ??? pregabalin (LYRICA) 50 MG capsule Take 1 capsule by mouth 3 (three) times a day.  0   ??? rosuvastatin (CRESTOR) 10 MG tablet Take by mouth.     ??? sodium bicarbonate 650 mg tablet Take 1 tablet (650 mg total) by mouth Two (2) times a day. 180 tablet 3   ??? triamcinolone (KENALOG) 0.1 % ointment Apply topically Two (2) times a day. 453.6 g 1   ??? TRUEPLUS PEN NEEDLE 32 gauge x 5/32 Ndle   10   ??? ustekinumab (STELARA) 45 mg/0.5 mL Syrg syringe Inject the contents of 1 syringe (45mg ) under the skin once every 12 weeks. START at week 16 0.5 mL 4   ??? ustekinumab (STELARA) 45 mg/0.5 mL Syrg syringe Inject the contents of 1 syringe (45 mg) under the skin at 0 and 4 weeks, and THEN inject 1 syringe (45mg ) every 12 weeks thereafter 1 mL 1   ??? ustekinumab (STELARA) 90 mg/mL Syrg syringe Inject  the contents of 1 syringe (90mg ) under the skin at week 0 and 4 as loading doses. THEN inject 1 pen (90mg ) every 12 weeks thereafter. 2 mL 0   ??? ustekinumab (STELARA) 90 mg/mL Syrg syringe Inject the contents of 1 syringe (90mg ) under the skin every 12 weeks. START at week 16 for maintenance. 1 mL 4     No current facility-administered medications for this visit.       Allergies   Allergen Reactions   ??? Sulfur-8 Nausea Only   ??? Glucophage [Metformin]    ??? Codeine Itching   ??? Morphine Itching       Patient Active Problem List   Diagnosis   ??? Chronic kidney disease, stage 3 (CMS-HCC)   ??? 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 Stage 4 chronic kidney disease (CMS-HCC)   ??? Type 2 diabetes mellitus with stage 4 chronic kidney disease, with long-term current use of insulin (CMS-HCC)   ??? Essential hypertension   ??? Hyperlipidemia, unspecified hyperlipidemia type   ??? Tobacco use       Reviewed and up to date in Epic.    Appropriateness of Therapy     Is medication and dose appropriate based on diagnosis? Yes    Prescription has been clinically reviewed: Yes    Baseline Quality of Life Assessment      How many days over the past month did your psoriasis  keep you from your normal activities? For example, brushing your teeth or getting up in the morning. 0    Financial Information     Medication Assistance provided: Prior Authorization    Anticipated copay of $0 after  patient-obtained copay card reviewed with patient. Verified delivery address.    Delivery Information     Scheduled delivery date: Thurs, 2/24    Expected start date: Thurs, 2/24    Medication will be delivered via Same Day Courier to the prescription address in [facility].  This shipment will not require a signature.      Explained the services we provide at [facility] and that each month we would call to set up refills.  Stressed importance of returning phone calls so that we could ensure they receive their medications in time each month.  Informed patient that we should be setting up refills 7-10 days prior to when they will run out of medication.  A pharmacist will reach out to perform a clinical assessment periodically.  Informed patient that a welcome packet, containing information about our pharmacy and other support services, a Notice of Privacy Practices, and a drug information handout will be sent.      Patient verbalized understanding of the above information as well as how to contact the pharmacy at [phone] option 4 with any questions/concerns.  The pharmacy is open Monday through Friday 8:30am-4:30pm.  A pharmacist is available 24/7 via pager to answer any clinical questions they may have.    Patient Specific Needs     - Does the patient have any physical, cognitive, or cultural barriers? No    - Patient prefers to have medications discussed with  Patient     - Is the patient or caregiver able to read and understand education materials at a high [place] level or above? Yes    - Patient's primary language is  English     - Is the patient high risk? No    - Does the patient require a Care Management Plan? No     - Does the patient require physician intervention or other additional services (i.e. nutrition, smoking cessation, social work)? No      Amy [name] A Desiree Lucy [facility] Specialty Pharmacist

## 2020-07-09 NOTE — Unmapped (Signed)
Received referral to verify patients [employer] has been verified.      Patient has active coverage with [employer] [place] [employer] plan, including prescription drug coverage via [employer]    Authorization is not required for Kidney transplant evaluation.      Patient is financially cleared for Kidney transplant evaluation

## 2020-07-24 NOTE — Unmapped (Signed)
Beula reports she's seen some clearing of her psoriasis on her elbows, and she's had less itching since starting her Stelara. She had an allergic reaction (rash, swollen bottom lip) about a week after her dose - her provider thinks it's not likely related, but I asked her to keep [place] posted if she experiences again.    Second load due on 3/24, then maintenance to begin on 6/16.     [facility] Clinical Assessment & Refill Coordination Note    Shakeerah Mcfarland, DOB: 11-Aug-1960  Phone: [phone] (home)     All above HIPAA information was verified with patient.     Was a Nurse, learning disability used for this call? No    Specialty Medication(s):   Inflammatory Disorders: Stelara     Current Outpatient Medications   Medication Sig Dispense Refill   ??? aspirin (ECOTRIN) 81 MG tablet Take 81 mg by mouth daily.      ??? betamethasone dipropionate (DIPROLENE) 0.05 % ointment   4   ??? calcipotriene (DOVONEX) 0.005 % cream Apply topically Two (2) times a day. 60 g 2   ??? cholecalciferol, vitamin D3, (VITAMIN D3) 1,000 unit capsule Take 1,000 Units by mouth daily.      ??? clobetasoL (TEMOVATE) 0.05 % ointment Apply to your thick psoriasis areas twice daily until clear 60 g 5   ??? empty container Misc Use as directed to dispose of Humira pens. 1 each 2   ??? insulin ASPART (NOVOLOG) 100 unit/mL injection Inject under the skin Three (3) times a day before meals. 10 mL 12   ??? insulin glargine (LANTUS) 100 unit/mL injection Inject 80 Units under the skin nightly.     ??? lisinopril (PRINIVIL,ZESTRIL) 10 MG tablet Take 10 mg by mouth daily.     ??? methocarbamoL (ROBAXIN) 500 MG tablet Take 1 tablet (500 mg total) by mouth Three (3) times a day as needed for up to 30 doses. 30 tablet 0   ??? pregabalin (LYRICA) 50 MG capsule Take 1 capsule by mouth 3 (three) times a day.  0   ??? rosuvastatin (CRESTOR) 10 MG tablet Take by mouth.     ??? sodium bicarbonate 650 mg tablet Take 1 tablet (650 mg total) by mouth Two (2) times a day. 180 tablet 3   ??? triamcinolone (KENALOG) 0.1 % ointment Apply topically Two (2) times a day. 453.6 g 1   ??? TRUEPLUS PEN NEEDLE 32 gauge x 5/32 Ndle   10   ??? ustekinumab (STELARA) 45 mg/0.5 mL Syrg syringe Inject the contents of 1 syringe (45mg ) under the skin once every 12 weeks. START at week 16 0.5 mL 4   ??? ustekinumab (STELARA) 45 mg/0.5 mL Syrg syringe Inject the contents of 1 syringe (45 mg) under the skin at 0 and 4 weeks, and THEN inject 1 syringe (45mg ) every 12 weeks thereafter 1 mL 1   ??? ustekinumab (STELARA) 90 mg/mL Syrg syringe Inject  the contents of 1 syringe (90mg ) under the skin at week 0 and 4 as loading doses. THEN inject 1 pen (90mg ) every 12 weeks thereafter. 2 mL 0   ??? ustekinumab (STELARA) 90 mg/mL Syrg syringe Inject the contents of 1 syringe (90mg ) under the skin every 12 weeks. START at week 16 for maintenance. 1 mL 4     No current facility-administered medications for this visit.        Changes to medications: Aliegha reports starting the following medications: prednisone - taking short course for  allergic reaction    Allergies   Allergen Reactions   ??? Sulfur-8 Nausea Only   ??? Glucophage [Metformin]    ??? Codeine Itching   ??? Morphine Itching       Changes to allergies: No    SPECIALTY MEDICATION ADHERENCE     Stelara - 0 left  Medication Adherence    Patient reported X missed doses in the last month: 0  Specialty Medication: Stelara  Patient is on additional specialty medications: No          Specialty medication(s) dose(s) confirmed: Regimen is correct and unchanged.     Are there any concerns with adherence? No    Adherence counseling provided? Not needed    CLINICAL MANAGEMENT AND INTERVENTION      Clinical Benefit Assessment:    Do you feel the medicine is effective or helping your condition? Yes    Clinical Benefit counseling provided? Not needed    Adverse Effects Assessment:    Are you experiencing any side effects? No    Are you experiencing difficulty administering your medicine? No    Quality of Life Assessment:    How many days over the past month did your psoriasis  keep you from your normal activities? For example, brushing your teeth or getting up in the morning. 0    Have you discussed this with your provider? Not needed    Therapy Appropriateness:    Is therapy appropriate? Yes, therapy is appropriate and should be continued    DISEASE/MEDICATION-SPECIFIC INFORMATION      For patients on injectable medications: Patient currently has 0 doses left.  Next injection is scheduled for 3/24.    PATIENT SPECIFIC NEEDS     - Does the patient have any physical, cognitive, or cultural barriers? No    - Is the patient high risk? No    - Does the patient require a Care Management Plan? No     - Does the patient require physician intervention or other additional services (i.e. nutrition, smoking cessation, social work)? No      SHIPPING     Specialty Medication(s) to be Shipped:   Inflammatory Disorders: Stelara    Other medication(s) to be shipped: No additional medications requested for fill at this time     Changes to insurance: No    Delivery Scheduled: Yes, Expected medication delivery date: Thurs, 3/24.     Medication will be delivered via Same Day Courier to the confirmed prescription address in [facility].    The patient will receive a drug information handout for each medication shipped and additional FDA Medication Guides as required.  Verified that patient has previously received a Conservation officer, historic buildings.    All of the patient's questions and concerns have been addressed.    Lanney Gins   [facility] Specialty Pharmacist

## 2020-07-29 ENCOUNTER — Encounter: Admit: 2020-07-29 | Discharge: 2020-07-30 | Payer: PRIVATE HEALTH INSURANCE

## 2020-07-29 ENCOUNTER — Encounter
Admit: 2020-07-29 | Discharge: 2020-07-30 | Payer: PRIVATE HEALTH INSURANCE | Attending: Rehabilitative and Restorative Service Providers" | Primary: Rehabilitative and Restorative Service Providers"

## 2020-07-29 DIAGNOSIS — G5701 Lesion of sciatic nerve, right lower limb: Principal | ICD-10-CM

## 2020-07-29 MED ORDER — NAPROXEN 500 MG TABLET
ORAL_TABLET | Freq: Two times a day (BID) | ORAL | 1 refills | 30.00000 days | Status: CP
Start: 2020-07-29 — End: ?

## 2020-07-29 NOTE — Unmapped (Signed)
ORTHOPAEDIC CLINIC NOTE  Date: 07/29/2020   [facility]    Primary Care Physician: Emogene Morgan, MD  Established patient, new problem.    ASSESSMENT:  Amy [name] is a 60 y.o. female with the following visit diagnoses:    ICD-10-CM   1. Piriformis syndrome of right side  G57.01       PLAN:    -   - Home exercise program provided  - Prescribed  Naprosyn for swelling and pain  - Ice affected area for 15-20 minutes every hour as needed  - Refer to non-op sports medicine for further evaluation under ultrasound with consideration of guided injection, if warranted    Requested Prescriptions     Signed Prescriptions Disp Refills   ??? naproxen (NAPROSYN) 500 MG tablet 60 tablet 1     Sig: Take 1 tablet (500 mg total) by mouth 2 (two) times a day with meals.      Scheduling Notes:  Planned Return for Ultrasound Procedure/guided injection - right lateral/posterior hip.  SUBJECTIVE:  Chief complaint: Hip pain   History of Present Illness:   (>4: location, quality, severity, timing, duration, context, modifying factors, associated symptoms)  60 y.o. female who presents for evaluation of Right Hip pain.   Mechanism of Injury: taking a mis-step, date: 3 weeks ago, patient developed right posterior and lateral-sided hip pain after stepping into a ditch in her yard. She denies any radiating symptoms or associated numbness/tingling.  Location: buttock and lateral  Aggravators: walking  Treatments tried: OTC NSAIDs (ibuprofen, Aleve, topical votaren gel)    Patient denies numbness/tingling distally.  Pain Assessment: 0-10  0-10 Pain Scale: 7  Pain Location: Hip  Pain Orientation: Right  Pain Descriptors: Aching, Sharp, Sore, Tightness  Pain Frequency: Constant/continuous  Pain Onset: Progressive  Clinical Progression: Gradually worsening  Aggravating Factors: Walking, Standing  Result of Injury: No  Work-Related Injury: No  Pain Intervention Given:  (advil, muscle relaxer, icy hot)      Review of Systems Pertinent positives and negatives are documented in the HPI. All other systems reviewed are negative.   Medical History Past Medical History:   Diagnosis Date   ??? Alport syndrome    ??? Back pain    ??? Chronic kidney disease    ??? Dental abscess    ??? Diabetes mellitus (CMS-HCC)     type 2, don't remember A1C, controlled with insulin injections; okay per physician at last visit   ??? Hyperlipidemia    ??? Hypertension     well controlled with meds   ??? Kidney stone     no problems since 1995   ??? Psoriasis       Surgical History Past Surgical History:   Procedure Laterality Date   ??? CATARACT EXTRACTION Right 08/29/2017   ??? COLONOSCOPY     ??? cyst removal in breast     ??? HYSTERECTOMY     ??? NEPHROSTOMY     ??? PR COLSC FLX W/RMVL OF TUMOR POLYP LESION SNARE TQ N/A 06/19/2018    Procedure: COLONOSCOPY FLEX; W/REMOV TUMOR/LES BY SNARE;  Surgeon: Jarvis Morgan, MD;  Location: [facility];  Service: Gastroenterology   ??? PR XCAPSL CTRC RMVL INSJ IO LENS PROSTH W/O ECP Right 08/29/2017    Procedure: EXTRACAPSULAR CATARACT REMOVAL W/INSERTION OF INTRAOCULAR LENS PROSTHESIS, MANUAL OR MECHANICAL TECHNIQUE Lens Choice:OD ZCB00 +19.50 [id number] +19.00/+18.00;  Surgeon: O'Rese Bonney Roussel, MD;  Location: [facility];  Service: Ophthalmology   ???  PR XCAPSL CTRC RMVL INSJ IO LENS PROSTH W/O ECP Left 09/19/2017    Procedure: EXTRACAPSULAR CATARACT REMOVAL W/INSERTION OF INTRAOCULAR LENS PROSTHESIS, MANUAL OR MECHANICAL TECHNIQUE Lens Choice: ZCB00 +21.00; ZA9003 +20.00/+19.00;  Surgeon: O'Rese Bonney Roussel, MD;  Location: [facility] OR [facility];  Service: Ophthalmology      Allergies Sulfur-8, Glucophage [metformin], Codeine, and Morphine   Medications She has a current medication list which includes the following prescription(s): aspirin, betamethasone dipropionate, calcipotriene, cholecalciferol (vitamin d3-25 mcg (1,000 unit)), clobetasol, empty container, insulin aspart, insulin glargine, lisinopril, methocarbamol, naproxen, pregabalin, rosuvastatin, sodium bicarbonate, triamcinolone, trueplus pen needle, stelara, stelara, ustekinumab, and ustekinumab.   Family History Her family history includes Cancer in her paternal grandmother; Cataracts in her mother; Diabetes in her mother; Heart attack in her father; Kidney disease in her mother.   Social History She reports that she has been smoking. She has been smoking about 1.00 pack per day. She has never used smokeless tobacco. She reports that she does not drink alcohol and does not use drugs.Home address:[address]  [place] [address]        Occupational History   ??? Not on file     Social History     Socioeconomic History   ??? Marital status: Single     Spouse name: None   ??? Number of children: None   ??? Years of education: None   ??? Highest education level: None   Occupational History   ??? None   Tobacco Use   ??? Smoking status: Current Every Day Smoker     Packs/day: 1.00   ??? Smokeless tobacco: Never Used   Substance and Sexual Activity   ??? Alcohol use: No   ??? Drug use: No   ??? Sexual activity: Not Currently   Other Topics Concern   ??? Do you use sunscreen? No   ??? Tanning bed use? Yes   ??? Are you easily burned? No   ??? Excessive sun exposure? No   ??? Blistering sunburns? Yes   Social History Narrative   ??? None     Social Determinants of Health     Financial Resource Strain: Not on file   Food Insecurity: Not on file   Transportation Needs: Not on file   Physical Activity: Not on file   Stress: Not on file   Social Connections: Not on file        OBJECTIVE:  DETAILED PHYSICAL EXAM (12 Point)  General Appearance ?? well-nourished, in no acute distress.   Mood and Affect ?? alert, cooperative and pleasant.   Pulmonary ?? No labored breathing or shortness of breath   Cardiovascular ?? well-perfused distally and no edema.   Lymphatics ?? No lymphadenopathy   Sensation ?? sensation to light touch distally normal    MUSCULOSKELETAL    Left Hip  Inspection/palpation  Range of motion  Stability  Strength  Skin ??? Inspection/palpation:  No swelling, erythema, deformity, atrophy or hypertrophy noted.  ??? Range of motion: Normal without limitations  ??? Stability:  No instability noted.  ??? Strength:  5/5 strength in all directions  ??? Skin:  Warm, dry, and intact   Right Hip  Inspection/palpation  Range of motion  Stability  Strength  Skin ??? Inspection: No swelling, erythema, or deformity  ?? Palpation: Tender: greater trochanter, piriformis  ??? Range of motion:  Normal IR and ER without pain   ??? Strength:  5/5 strength   ??? Special Tests: No pain with  Log Roll test and Negative Labral Impingement sign  ??? Skin:  Warm, dry, and intact     Test Results  Imaging  Three views of the right hip were obtained today and independently reviewed by me and reveal mild degenerative changes with joint space narrowing without osteophyte formation.    Procedure  None    MEDICAL DECISION MAKING (level of service defined by 2/3 elements)     Number/Complexity of Problems Addressed 1 acute, uncomplicated illness or injury (99203/99213)   Amount/Complexity of Data to be Reviewed/Analyzed Independent interpretation of a test performed by another physician/other qualified health care professional (99204/99214)   Risk of Complications/Morbidity/Mortality of Management Prescription Medication (99204/99214)

## 2020-07-29 NOTE — Unmapped (Signed)
- Ice affected area for 15-20 minutes 2-3 times daily  - Heat affected area for 20-30 minutes 2-3 times daily  - Perform Home Exercises as described below  - Refer to non-op sports medicine for ultrasound evaluation and possible injection  - Take Naprosyn for swelling and pain  - If you have any questions or concerns, contact our sports team coverage line at [phone]      Homemade Ice Pack  Supplies:  Letta Pate ziploc bag  - Rubbing Alcohol  - Water    Combine 1?? cups of water with a ?? cup of rubbing alcohol into ziploc bag. Seal and put in the freezer for several hours or overnight.    Hip: Exercises  Your Care Instructions  Here are some examples of typical rehabilitation exercises for your condition. Start each exercise slowly. Ease off the exercise if you start to have pain.  Your doctor or physical therapist will tell you when you can start these exercises and which ones will work best for you.  How to do the exercises  Iliotibial band stretch    1. Lean sideways against a wall. If you are not steady on your feet, hold on to a chair or counter.  2. Stand on the leg with the affected hip, with that leg close to the wall. Then cross your other leg in front of it.  3. Let your affected hip drop out to the side of your body and against the wall. Then lean away from your affected hip until you feel a stretch.  4. Hold the stretch for 15 to 30 seconds.  5. Repeat 2 to 4 times.     Hamstring stretch    1. Stand on the unaffected leg and prop the affected leg on a chair or stool, as shown.  2. Lean forward slightly. You should feel a gentle stretch down the back of your leg.  1. Do not arch your back.  2. Do not bend either knee.  3. Do not point your toes.  3. Hold the stretch for at least 1 minute to begin. Then try to lengthen the time you hold the stretch to as long as 6 minutes.  4. Repeat 2 to 4 times.    Straight-leg raises to the outside    1. Lie on your side, with your affected leg on top.  2. Tighten the front thigh muscles of your top leg to keep your knee straight.  3. Keep your hip and your leg straight in line with the rest of your body, and keep your knee pointing forward. Do not drop your hip back.  4. Lift your top leg straight up toward the ceiling, about 12 inches off the floor. Hold for about 6 seconds, then slowly lower your leg.  5. Repeat 8 to 12 times.    Clamshell    1. Lie on your side, with your affected leg on top and your head propped on a pillow. Keep your feet and knees together and your knees bent.  2. Raise your top knee, but keep your feet together. Do not let your hips roll back. Your legs should open up like a clamshell.  3. Hold for 6 seconds.  4. Slowly lower your knee back down. Rest for 10 seconds.  5. Repeat 8 to 12 times.    Standing quadriceps stretch    1. If you are not steady on your feet, hold on to a chair, counter, or wall. You can also lie  on your stomach or your side to do this exercise.  2. Bend the knee of the leg you want to stretch, and reach behind you to grab the front of your foot or ankle with the hand on the same side. For example, if you are stretching your right leg, use your right hand.  3. Keeping your knees next to each other, pull your foot toward your buttock until you feel a gentle stretch across the front of your hip and down the front of your thigh. Your knee should be pointed directly to the ground, and not out to the side.  4. Hold the stretch for 15 to 30 seconds.  5. Repeat 2 to 4 times.    Piriformis stretch    1. Sit in a chair.  2. Cross your affected leg over the other leg as shown. Press the knee of the affected leg toward the floor and lean forward as shown.  3. Hold the stretch for 15 to 30 seconds.  4. Repeat 2 to 4 times.      Double knee-to-chest    1. Lie on your back with your knees bent and your feet flat on the floor. You can put a small pillow under your head and neck if it is more comfortable.  2. Bring both knees to your chest.  3. Keep your lower back pressed to the floor. Hold for 15 to 30 seconds.  4. Relax, and lower your knees to the starting position.  5. Repeat 2 to 4 times.    Hip Thera-band Exercises      1. Perform each direction for 3 sets of 10 repetitions

## 2020-08-05 NOTE — Unmapped (Signed)
Patient called me and left a VM and stated she was told to call me from a letter.  Called her back and explained the letter had my number to call with questions but to call a different number to schedule appointments.  Patient was transferred to scheduling line to have appointments scheduled.

## 2020-08-06 MED FILL — STELARA 45 MG/0.5 ML SUBCUTANEOUS SYRINGE: 84 days supply | Qty: 0.5 | Fill #1

## 2020-08-20 ENCOUNTER — Encounter: Admit: 2020-08-20 | Discharge: 2020-08-21 | Payer: PRIVATE HEALTH INSURANCE

## 2020-08-20 NOTE — Unmapped (Signed)
The pain seems to be coming from the hip joint and I recommend that we start with physical therapy.     A cortisone injection would be helpful, as it would confirm the diagnosis and also help with the pain but I cannot perform a cortisone injection until we are move sure of your blood sugar.  I need an A1c below 9 to safely perform an injection.    Please keep a log of twice daily blood glucose measurements and bring them to our follow-up visit in 8 weeks.      Please schedule a follow-up visit in 8 weeks.

## 2020-08-20 NOTE — Unmapped (Signed)
ORTHO PATIENT ENCOUNTER     I had the pleasure of seeing Amy [name] at the [place] [facility] [employer].        HISTORY OF PRESENT ILLNESS  Amy [name] is a 60 y.o. year old female who presents for evaluation and treatment of right hip pain.  She reports that around 6 weeks ago she started to develop pain in the right hip region.  She states that she missed stepped on the threshold of the door and that was the onset of the pain.  The pain can travel around to the buttock region and down towards the knee but does not involve the knee.  She denies any swelling.  She notices the pain when sitting down or standing up, or if she has to walk long distances.  She denies any pain radiating distal to the knee.  There is no lower extremity weakness, numbness, or tingling.  She denies any tenderness palpation.    ROS  MSK: + hip pain  Neuro: negative    Medications, allergies, and medical history were reviewed       PHYSICAL EXAM:    Hip and Pelvis:   Palpation:              Tenderness: None at hip flexor, SI, ASIS, GT, piriformis             Masses: Absent bilateral hips/pelvis  ROM:         R   /   L   IR:: 15 / 15   ER: 50 / 50   Flexion: 110 / 110    Muscle Strength: 5/5 strength of all muscle of the bilateral hip and pelvis  Pain with Resistance Testing:              Sartorius: negative             Rectus Femoris: negative             Iliopsoas: negative   Hamstring extension: negative   Hamstring flexion: negative   Abduction: negative   Adduction: negative  Special Tests:             Straight Leg Raise: Negative   Slump: Negative              FADIR: mild pain             FABER: Negative             Scour: mild pain   Log roll: Negative    Thomas Test: Negative    IMAGING:  X-ray right hip: 07/29/2020  My interpretation (images personally reviewed and shown to patient): Mild right hip joint space narrowing with osteophytosis    LABS:  No results found for: A1C    ASSESSMENT AND PLAN:  Amy [name] is a 60 y.o. year old female with:     Diagnosis ICD-10-CM Associated Orders   1. Primary localized osteoarthritis of right hip  M16.11 Ambulatory referral to Physical Therapy     I had a long discussion with her that I suspect her pain is coming from the hip joint, although her presentation is not classic.  Pain can refer to the buttock and down into the thigh with hip joint pain.  She did have some mild groin pain with hip loading maneuvers as well and she has significantly limited internal rotation.  I do not think that this is lumbar radiculopathy or piriformis syndrome based upon her examination today.  She has no lower extremity weakness and tension tests are negative.  Furthermore, she has no tenderness over piriformis and deep gluteal stretching did not reproduce any of her pain.  The possibility of a corticosteroid injection had been discussed with her but I recommended against it due to her poorly controlled diabetes.  She states that her A1c as far she knows is in the 9 range.  She is also on insulin and states that she has been taking her insulin 3 times per day but the last time she checked her blood glucose at home was 1-1/2 to 2 weeks ago.  I had a long discussion with her that it was not safe to be injecting insulin if she is not checking her fingerstick blood glucose on a daily basis.  Nor, is it safe for me to perform a corticosteroid injection which could elevate her blood sugar significantly without having better control of her sugars.  I recommended that we start with physical therapy for the next 8 weeks and she work on checking her blood sugar regularly.  I asked her to keep a log and bring it back to me at the 8-week visit and if her blood sugars are acceptable we could perform a corticosteroid injection if she continues to have pain despite physical therapy.  F/U in 8 weeks.  Referral to PT provided.     MEDICAL DECISION MAKING (level of service defined by 2/3 elements) Number/Complexity of Problems Addressed 1 undiagnosed new problem with uncertain prognosis (99204/99214)   Amount/Complexity of Data to be Reviewed/Analyzed Independent interpretation of a test performed by another physician/other qualified health care professional (99204/99214)   Risk of Complications/Morbidity/Mortality of Management Physical Therapy/Occupational Therapy (99203/99213)     TIME     Total Time for E/M Services on the Date of Encounter Time-based coding not utilized for this encounter     MODIFIER -25     Significant, Separately Billable Evaluation and Management YES - The patient's condition required a significant, separately identifiable, medical necessary evaluation and management service by the provider in addition to the procedure performed on the same date of service. The evaluation and management service was above and beyond the usual preoperative and postoperative care associated with the procedure.

## 2020-08-21 DIAGNOSIS — Z01818 Encounter for other preprocedural examination: Principal | ICD-10-CM

## 2020-08-21 DIAGNOSIS — N186 End stage renal disease: Principal | ICD-10-CM

## 2020-08-27 ENCOUNTER — Encounter: Admit: 2020-08-27 | Discharge: 2020-08-28 | Payer: PRIVATE HEALTH INSURANCE

## 2020-08-27 ENCOUNTER — Ambulatory Visit: Admit: 2020-08-27 | Discharge: 2020-08-28 | Payer: PRIVATE HEALTH INSURANCE

## 2020-08-27 ENCOUNTER — Ambulatory Visit
Admit: 2020-08-27 | Discharge: 2020-08-28 | Payer: PRIVATE HEALTH INSURANCE | Attending: Nephrology | Primary: Nephrology

## 2020-08-27 DIAGNOSIS — N184 Chronic kidney disease, stage 4 (severe): Principal | ICD-10-CM

## 2020-08-27 DIAGNOSIS — Q8781 Alport syndrome: Principal | ICD-10-CM

## 2020-08-27 DIAGNOSIS — Z01818 Encounter for other preprocedural examination: Principal | ICD-10-CM

## 2020-08-27 LAB — RUBELLA ANTIBODY, IGG: RUBELLA IGG SCREEN: POSITIVE

## 2020-08-27 LAB — LACTATE DEHYDROGENASE: LACTATE DEHYDROGENASE: 250 U/L — ABNORMAL HIGH (ref 120–246)

## 2020-08-27 LAB — HEPATIC FUNCTION PANEL
ALBUMIN: 3.7 g/dL (ref 3.4–5.0)
ALKALINE PHOSPHATASE: 113 U/L (ref 46–116)
ALT (SGPT): 17 U/L (ref 10–49)
AST (SGOT): 19 U/L (ref <=34)
BILIRUBIN DIRECT: <0.1 mg/dL (ref 0.00–0.30)
BILIRUBIN TOTAL: 0.3 mg/dL (ref 0.3–1.2)
PROTEIN TOTAL: 7.4 g/dL (ref 5.7–8.2)

## 2020-08-27 LAB — CBC W/ AUTO DIFF
BASOPHILS ABSOLUTE COUNT: 0 10*9/L (ref 0.0–0.1)
BASOPHILS RELATIVE PERCENT: 0.5 %
EOSINOPHILS ABSOLUTE COUNT: 0.1 10*9/L (ref 0.0–0.5)
EOSINOPHILS RELATIVE PERCENT: 1.6 %
HEMATOCRIT: 39.4 % (ref 34.0–44.0)
HEMOGLOBIN: 13.3 g/dL (ref 11.3–14.9)
LYMPHOCYTES ABSOLUTE COUNT: 3 10*9/L (ref 1.1–3.6)
LYMPHOCYTES RELATIVE PERCENT: 39.3 %
MEAN CORPUSCULAR HEMOGLOBIN CONC: 33.8 g/dL (ref 32.0–36.0)
MEAN CORPUSCULAR HEMOGLOBIN: 29.8 pg (ref 25.9–32.4)
MEAN CORPUSCULAR VOLUME: 88.2 fL (ref 77.6–95.7)
MEAN PLATELET VOLUME: 7.9 fL (ref 6.8–10.7)
MONOCYTES ABSOLUTE COUNT: 0.6 10*9/L (ref 0.3–0.8)
MONOCYTES RELATIVE PERCENT: 7.1 %
NEUTROPHILS ABSOLUTE COUNT: 4 10*9/L (ref 1.8–7.8)
NEUTROPHILS RELATIVE PERCENT: 51.5 %
PLATELET COUNT: 154 10*9/L (ref 150–450)
RED BLOOD CELL COUNT: 4.47 10*12/L (ref 3.95–5.13)
RED CELL DISTRIBUTION WIDTH: 14.5 % (ref 12.2–15.2)
WBC ADJUSTED: 7.7 10*9/L (ref 3.6–11.2)

## 2020-08-27 LAB — HOMOCYSTEINE: HOMOCYSTEINE PLASMA: 22.7 umol/L — ABNORMAL HIGH (ref 3.7–13.9)

## 2020-08-27 LAB — HEMOGLOBIN A1C
ESTIMATED AVERAGE GLUCOSE: 200 mg/dL
HEMOGLOBIN A1C: 8.6 % — ABNORMAL HIGH (ref 4.8–5.6)

## 2020-08-27 LAB — TRANSPLANT IMMUNE STATUS - EBV: EPSTEIN-BARR VCA IGG ANTIBODY: POSITIVE — AB

## 2020-08-27 LAB — HSV ANTIBODIES, IGG
HERPES SIMPLEX VIRUS 1 IGG: POSITIVE — AB
HERPES SIMPLEX VIRUS 2 IGG: POSITIVE — AB

## 2020-08-27 LAB — LIPID PANEL
CHOLESTEROL/HDL RATIO SCREEN: 6.2 — ABNORMAL HIGH (ref 1.0–4.5)
CHOLESTEROL: 191 mg/dL (ref <=200)
HDL CHOLESTEROL: 31 mg/dL — ABNORMAL LOW (ref 40–60)
NON-HDL CHOLESTEROL: 160 mg/dL — ABNORMAL HIGH (ref 70–130)
TRIGLYCERIDES: 668 mg/dL — ABNORMAL HIGH (ref 0–150)

## 2020-08-27 LAB — APTT
APTT: 30.7 s (ref 24.9–36.9)
HEPARIN CORRELATION: 0.2

## 2020-08-27 LAB — CREATININE
CREATININE: 3.54 mg/dL — ABNORMAL HIGH
EGFR CKD-EPI AA FEMALE: 16 mL/min/{1.73_m2} — ABNORMAL LOW (ref >=60)
EGFR CKD-EPI NON-AA FEMALE: 14 mL/min/{1.73_m2} — ABNORMAL LOW (ref >=60)

## 2020-08-27 LAB — ANTITHROMBIN III: ANTITHROMB III, FUNC: 107 % (ref 83–123)

## 2020-08-27 LAB — CMV IGG: CMV IGG: POSITIVE — AB

## 2020-08-27 LAB — FIBRINOGEN: FIBRINOGEN LEVEL: 504 mg/dL — ABNORMAL HIGH (ref 177–386)

## 2020-08-27 LAB — GAMMA GT: GAMMA GLUTAMYL TRANSFERASE: 27 U/L

## 2020-08-27 LAB — PROTIME-INR
INR: 0.91
PROTIME: 10.6 s (ref 10.3–13.4)

## 2020-08-27 LAB — URIC ACID: URIC ACID: 7.1 mg/dL

## 2020-08-27 LAB — VARICELLA ZOSTER ANTIBODY, IGG: VARICELLA ZOSTER IGG: POSITIVE

## 2020-08-27 LAB — BUN: BLOOD UREA NITROGEN: 41 mg/dL — ABNORMAL HIGH (ref 9–23)

## 2020-08-27 LAB — TOXOPLASMA GONDII ANTIBODY, IGG: TOXOPLASMA GONDII IGG: POSITIVE — AB

## 2020-08-27 NOTE — Unmapped (Signed)
PATIENT NAME: Amy [name]   MR#: [id number]    DOB: Feb 22, 1961      [facility]  CONFIDENTIAL SOCIAL WORK  KIDNEY TRANSPLANT ASSESSMENT         DATE OF EVALUATION: 08/27/2020    INFORMANTS: Patient/Amy [name], daughter/Ashley Thurmond Butts    PREFERRED LANGUAGE: English     TRANSPLANT PHASE:  Evaluation   TRANSPLANT STATUS:  Active     The limits of confidentiality and the purpose of the evaluation were reviewed. The patient was provided with a verbal description of the nature and purpose of the social work evaluation. I also reviewed the referral source, specific referral question for this evaluation, foreseeable risks/discomforts, benefits, limits of confidentiality, and mandatory reporting requirements of this provider. The patient was given the opportunity to ask questions and receive answers about the present evaluation. Oral consent was provided by the patient.     PRESENTING MEDICAL PROBLEMS AND RELEVANT HISTORY:   Amy [name] is a 60 y.o. Caucasian female who  has a past medical history of Alport syndrome, Back pain, Chronic kidney disease, Dental abscess, Diabetes mellitus (CMS-HCC), Hyperlipidemia, Hypertension, Kidney stone, and Psoriasis.     FUNCTIONAL STATUS:   Amy [name] presents as independent with all personal ADLs, including bathing, dressing, cooking, and household chores. Patient drives.     DME: denies  Activity level/Functional Status: sedentary; has arthritis    UNDERSTANDING OF MEDICAL CONDITION AND TRANSPLANT:   Amy [name] and daughter attended the Transplant Orientation on 08/27/20.    Patient verbalizes understanding that alport disease caused her kidney disease.     Pt understands medical illness process: Moderate  Pt understands transplant process/psychosocial risks: Good  Education provided: orientation class information regarding transplant process and guidelines were reviewed, including psychosocial risks.    SUPPORT PLAN FOR TRANSPLANTATION:     Plan details: Plan to recover at home with Amy [name] as primary caregiver. Patient is not sure about back-up support at this time.    Primary Support/ Relationship to patient: Amy [name]  Age/DOB: 53  Understanding of medical directions: yes  Employment: full-time  Support limitations: other caregiving responsibilities; has a 60-year-old daughter  Support strengths: FMLA eligible, health literate, access to reliable vehicle  and comfortable driving to [facility], lives close by to patient.    Back-up Support/ Relationship to patient: not yet identified      Advance Directive: None. Education and form were provided.  <no information>    DIALYSIS HISTORY:   Dialysis History    Patient has no recorded history of dialysis.         Transportation:   Self: yes  Dialysis: NA  Distance from Home to [facility]: [address]  [place] [address]    ATTITUDE ABOUT TRANSPLANT: I guess, if I want to have a better life  Expectations: to feel better  Fears/Concerns: yes; concerned about how I may really feel after its all over with. Will I make it through?    SOCIAL HISTORY:   Citizenship Status: [place] Citizen   Personal History: born in [place] and has lived in [place] since the age of 5  Marital Status: single  Lives with: self   Children/Dependents: 1 adult child, Amy [name] (age 31)   Social support: friends and family  Housing: 1 level; not at risk of losing home; good repair    COMPLIANCE HISTORY:   Problems obtaining medications or attending appointments (including transportation):  denies  Problems organizing medications: denies; uses a pill box  Adherence to treatment/medications: yes, except for physical therapy visits because of the copay costs    EDUCATION AND WORK HISTORY:   Highest completed grade level: 12   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; took care of mom for 5 years before that  Military History: no     MEDICAL/PRESCRIPTION COVERAGE:   [employer] assistance: no  Optician, dispensing Name Rel Member # Group #   BCBS - BCBS [employer] * [name],Amy Self [id number]      [address]       FINANCIAL RESOURCES:   Current income sources: full-time employment  Monthly expenses: lot rent, utilities, car payment, medication co-pays, other customary bills  Financial Risks and Debts: admits to 2 credit card but able to manage okay  Current income meets basic needs: Yes    STRESSORS/COPING STYLE: I don't know just try to let it go and move along  Religious/Spiritual Beliefs: Baptist    PSYCHIATRIC HISTORY:   Current issues/mood: denies  Past issues: admits to depression in the past and took Zoloft  Medications: admits to Zoloft for a year and then stopped taking it. Admits she tried other medications, but did not work well for her.  Therapy: denies  SI/HI: denies  Hospitalizations: denies  Loss/Trauma/Violence: denies  Mania: denies  Psychosis: denies  Hx of adverse reactions to treatment/medication/steroids: denies    PHQ-2 Score: 0  GAD-2 Score: 0      They agreed to discuss any changes in the patient's mood or behavior with the medical team.    MENTAL STATUS:   Affect: normal, mood congruent  Appearance: well dressed, season and temperature appropriate  Attention Span: normal attention span  Attitude: friendly, cooperative  Behavior: calm, cooperative  Insight & Judgement: intact/appropriate, reliable insight  Level of Consciousness: alert  Mood: euthymic/normal/stable  Orientation: person, place, time, date  Speech: normal speech  Thought Content: logical connections    COGNITIVE HISTORY & HEALTH LITERACY:   Amy [name] does  seem cognitively intact. She denies history of memory or cognitive concerns related to her health. There were not concerns for health literacy.     Do you need to have someone help you when you read instructions, pamphlets, or other written material from your doctor or pharmacy? No. How often?     Hx of special education or learning challenges: no history of educational challenges    SUBSTANCE HISTORY:   Tobacco: Amy [name]  reports that she has been smoking. She has been smoking about 1.00 pack per day. She has never used smokeless tobacco. She reports no active smoking cessation. She reports she has attempted gum in the past and thought it was nasty. She also admits she tried Chantix prescription and it did not do anything.  Alcohol: Amy [name]  reports no history of alcohol use.  Illicit Substances: Amy [name]  reports no history of drug use.    CHRONIC PAIN HISTORY (and referral needs): no    LEGAL HISTORY:   Amy [name] denies current or past history of legal issues.    A review of [place] DPS Offender Public Information website revealed no history of criminal charges.    COLLATERAL CONTACT:  Amy [name] was interviewed with her daughter for the duration of the interview.      EDUCATION:  KIDNEY Transplant Process  Psychosocial risks of transplant, including depression, anxiety, guilt, PTSD  CKD/ESRD can often cause stress on the entire family unit,  not just the patient  Advance Care Planning  Long-term financial and vocational planning  Expectations for support planning both pre- and post-transplant  Transplant social worker availability throughout transplant process  Proofreader and Healthcare Power of Attorney Forms  Kidney Transplant Patients and their Support Persons      ASSESSMENT AND RECOMMENDATIONS:  Amy [name] is a 60 y.o. female who presents today as part of her kidney transplant evaluation.      Patient presents as pleasant and fully participated in today's assessment. She was alert and oriented. Judgment and insight appear to be intact. Patient presented with her daughter, Amy [name]. Patient's daughter provided some feedback, but was also appeared to be tired throughout this interview.    Amy [name] admits to some depression in the past and the use of Zoloft as a treatment method. She reports she stopped taking it after a short while and has not needed it since. She denies any current mood issues.. Patient denies SI/HI or psychiatric hospitalizations. She denies any alcohol or substance use.    Patient appears to have a fair support system. Her daughter is involved and confirms she would assist patient, but they have not yet identified a back-up person. Amy [name] also has a 68-year-old daughter which she would need assistance with. There are concerns about patient's caregiving/support plan post transplant and this needs to be developed more. CSW provided some education on expectations of caregivers.    Patient reports having adequate financial resources. Patient works full-time as a Lawyer. CSW discussed fundraising for future medical expenses and encouraged patient to consider this.    Patient has good access to necessary environmental resources.      Patient has a number of psychosocial strengths, including adherence to medical treatment, adequate financial resources  and denial of substance abuse concerns    SIPAT SCORE: 31    Final decision regarding listing status is based upon committee review at selection meeting.    Recommendations and Plan:   1. Develop caregiving plan and confirm with CSW once developed  2. Consider smoking cessation. CSW provided information on Quitline.   3. Consider fundraising for future medical expenses.  4. Consider Advanced Directives    CSW to provide continued support and encouragement.    Hughes Better, LCSW  Transplant Social Worker  [facility]

## 2020-08-27 NOTE — Unmapped (Signed)
2022 Face To Face Consultation    TFC had a face-to-face consultation today 08/27/20, with the patient regarding current transplant insurance plan benefits. TFC presented the patient with their consultation letter. The patient consented to perform the [facility] consultation face to face. The patient currently has BCBS primary and no secondary insurance and no tertiary insurance. The patient has [employer] active pharmacy plan that is active at this time. I discussed the Financial Agreement with the patient and she acknowledges that she understood and had no questions at this time. I explained if she had questions in the future to contact Caryl Never at [phone] and she will answer any questions.

## 2020-08-27 NOTE — Unmapped (Signed)
To help quit smoking:  - please make a dedicated appointment with your pirmary care doctor to discuss smoking cessation  - 1-800-QUIT-NOW

## 2020-08-27 NOTE — Unmapped (Signed)
TFC created a consultation letter to present to the patient at our face to face consultation on Thursday, August 27, 2020.

## 2020-08-27 NOTE — Unmapped (Signed)
Transplant Nephrology Clinic Visit       Referring Physician   [facility] Y[place], ANP  [address]  [facility]  [place],  [place] [address]       History of Present Illness     The patient is a 60 y.o. female who presents for evaluation for potential kidney transplant. The patient reports she feels well today and denies active symptoms.     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 This is a preemptive referral (not yet on dialysis).    eGFR 17 on 06/10/20  A1C 9.9% on 06/10/20    Cause of Kidney Disease: Alports  On dialysis?: no  Prior transplants:no  Prior blood product transfusions: no  Pregnancy history: G1P1  Major Infections: no  Kidney stones: once age 84, required nephrostomy tube, she does not know stone composition, she does continue with hydration and lemon water to prevent  Frequent UTI: no  Urine output: no  History of Heart disease: no  History of Lung disease: no  History of Liver disease:no  History of Diabetes Mellitus: yes  History of Hypertension: yes  History of Peripheral Vascular Disease: no   History of Stroke: no  History of Neurologic disease: no  History of Cancer: no  History of Autoimmune disease: psoriasis and possible psoriatic arthritis  History of Coagulation Disorder: no  History of Urinary Tract Abnormalities: no  Psychiatric History: no  Other Significant Medical History: abnormal breast biopsy, intraductal papilloma Jan 2020, requires followup mammogram    Activity Level: Works as Agricultural consultant. No exercise outside of work. Uses grocery cart and walking at grocery.    Screening/Preventative Medicine:  Last Colonoscopy: Feb 2020, repeat in 5 years  Last PAP: not performed many years, normal in remote past  Last Mammogram: has had breast biopsy Jan 2020, Intraductal papilloma with florid ductal hyperplasia and apocrine metaplasia, recommendation was repeat mammo in 6 months, need to repeat  Immunization History   Administered Date(s) Administered   ??? COVID-19 VACC,MRNA,(PFIZER)(PF)(IM) 06/14/2019, 07/05/2019, 03/11/2020   ??? DTaP, Unspecified Formulation 07/28/1967, 09/26/1967, 12/11/1967, 12/26/1968, 07/26/1978   ??? Hep A / Hep B 03/14/2006, 04/12/2006, 07/24/2008   ??? INFLUENZA TIV (TRI) PF (IM) 05/06/2010   ??? Influenza Vaccine Quad (IIV4 PF) 60mo+ injectable 03/28/2018   ??? MMR 07/26/1978   ??? Polio Virus Vaccine, Unspecified Formulation 07/28/1967, 09/26/1967, 12/12/1967, 12/26/1968   ??? TdaP 03/14/2006   ??? Tetanus and diptheria, adsorbed,(adult), 5Lf tetanus toxoid,PF 03/28/2018   ??? Tetanus-Diptheria Toxoids-TD(TDVAX),Asdorbed,2LF(IM) 04/14/1992, 03/28/2018       Social History  Living situation: Lives alone in [place], adult daughter lives nearby, also has a friend who can be backup support person who lives in [place].  Education: HS plus CNA certification  Employment: Works full time as Agricultural consultant  Alcohol use: none  Tobacco use: Currently less than pack a day. Age 40 to 47, 44 years x ~1ppd=43 pack years.   Illicit drug use: no  Other non-prescription drug use: no    Social History     Tobacco Use   ??? Smoking status: Current Every Day Smoker     Packs/day: 1.00   ??? Smokeless tobacco: Never Used   Substance Use Topics   ??? Alcohol use:  No   ??? Drug use: No       Past Medical History   Past Medical History:   Diagnosis Date   ??? Alport syndrome    ??? Back pain    ??? Chronic kidney disease    ??? Dental abscess    ??? Diabetes mellitus (CMS-HCC)     type 2, don't remember A1C, controlled with insulin injections; okay per physician at last visit   ??? Hyperlipidemia    ??? Hypertension     well controlled with meds   ??? Kidney stone     no problems since 1995 ??? Psoriasis          Past Surgical History   Past Surgical History:   Procedure Laterality Date   ??? CATARACT EXTRACTION Right 08/29/2017   ??? COLONOSCOPY     ??? cyst removal in breast     ??? HYSTERECTOMY     ??? NEPHROSTOMY     ??? PR COLSC FLX W/RMVL OF TUMOR POLYP LESION SNARE TQ N/A 06/19/2018    Procedure: COLONOSCOPY FLEX; W/REMOV TUMOR/LES BY SNARE;  Surgeon: Jarvis Morgan, MD;  Location: [facility];  Service: Gastroenterology   ??? PR XCAPSL CTRC RMVL INSJ IO LENS PROSTH W/O ECP Right 08/29/2017    Procedure: EXTRACAPSULAR CATARACT REMOVAL W/INSERTION OF INTRAOCULAR LENS PROSTHESIS, MANUAL OR MECHANICAL TECHNIQUE Lens Choice:OD ZCB00 +19.50 [id number] +19.00/+18.00;  Surgeon: O'Rese Bonney Roussel, MD;  Location: [facility];  Service: Ophthalmology   ??? PR XCAPSL CTRC RMVL INSJ IO LENS PROSTH W/O ECP Left 09/19/2017    Procedure: EXTRACAPSULAR CATARACT REMOVAL W/INSERTION OF INTRAOCULAR LENS PROSTHESIS, MANUAL OR MECHANICAL TECHNIQUE Lens Choice: ZCB00 +21.00; ZA9003 +20.00/+19.00;  Surgeon: O'Rese Bonney Roussel, MD;  Location: [facility] OR [facility];  Service: Ophthalmology     Hysterectomy [phone], she has one ovary  Nephrostomy tube was also around 1987, for kidney stone      Allergies   Allergies   Allergen Reactions   ??? Sulfur-8 Nausea Only   ??? Glucophage [Metformin]    ??? Codeine Itching   ??? Morphine Itching         Medications   Current Outpatient Medications   Medication Sig Dispense Refill   ??? pen needle, diabetic 32 gauge x 5/32 (4 mm) Ndle Inject as directed Three (3) times a day before meals.     ??? aspirin (ECOTRIN) 81 MG tablet Take 81 mg by mouth daily.      ??? betamethasone dipropionate (DIPROLENE) 0.05 % ointment   4   ??? calcipotriene (DOVONEX) 0.005 % cream Apply topically Two (2) times a day. 60 g 2   ??? cholecalciferol, vitamin D3, (VITAMIN D3) 1,000 unit capsule Take 1,000 Units by mouth daily.      ??? clobetasoL (TEMOVATE) 0.05 % ointment Apply to your thick psoriasis areas twice daily until clear 60 g 5   ??? empty container Misc Use as directed to dispose of Humira pens. 1 each 2   ??? insulin ASPART (NOVOLOG) 100 unit/mL injection Inject under the skin Three (3) times a day before meals. 10 mL 12   ??? insulin glargine (LANTUS) 100 unit/mL injection Inject 80 Units under the skin nightly.     ??? lisinopril (PRINIVIL,ZESTRIL) 10 MG tablet Take 10 mg by mouth daily.     ??? methocarbamoL (ROBAXIN) 500 MG tablet Take 1 tablet (500 mg total) by mouth Three (3) times a day as needed for up to 30 doses. 30 tablet  0   ??? naproxen (NAPROSYN) 500 MG tablet Take 1 tablet (500 mg total) by mouth 2 (two) times a day with meals. 60 tablet 1   ??? pregabalin (LYRICA) 50 MG capsule Take 1 capsule by mouth 3 (three) times a day.  0   ??? rosuvastatin (CRESTOR) 10 MG tablet Take by mouth.     ??? sodium bicarbonate 650 mg tablet Take 1 tablet (650 mg total) by mouth Two (2) times a day. 180 tablet 3   ??? triamcinolone (KENALOG) 0.1 % ointment Apply topically Two (2) times a day. 453.6 g 1   ??? TRUEPLUS PEN NEEDLE 32 gauge x 5/32 Ndle   10   ??? ustekinumab (STELARA) 45 mg/0.5 mL Syrg syringe Inject the contents of 1 syringe (45mg ) under the skin once every 12 weeks. START at week 16 0.5 mL 4   ??? ustekinumab (STELARA) 45 mg/0.5 mL Syrg syringe Inject the contents of 1 syringe (45 mg) under the skin at 0 and 4 weeks, and THEN inject 1 syringe (45mg ) every 12 weeks thereafter 1 mL 1   ??? ustekinumab (STELARA) 90 mg/mL Syrg syringe Inject  the contents of 1 syringe (90mg ) under the skin at week 0 and 4 as loading doses. THEN inject 1 pen (90mg ) every 12 weeks thereafter. 2 mL 0   ??? ustekinumab (STELARA) 90 mg/mL Syrg syringe Inject the contents of 1 syringe (90mg ) under the skin every 12 weeks. START at week 16 for maintenance. 1 mL 4     No current facility-administered medications for this visit.         Family History   Family History   Problem Relation Age of Onset   ??? Kidney disease Mother    ??? Diabetes Mother    ??? Cataracts Mother    ??? Heart attack Father    ??? Cancer Paternal Grandmother    ??? Breast cancer Neg Hx    ??? Colon cancer Neg Hx    ??? Endometrial cancer Neg Hx    ??? Ovarian cancer Neg Hx    ??? Melanoma Neg Hx    ??? Basal cell carcinoma Neg Hx    ??? Squamous cell carcinoma Neg Hx      CKD in mother (no HD or dialysis)  MI in father  No skin cancer  No other cancers  No hearing loss    Review of Systems     no chest pain, no shortness of breath, no nausea, no vomiting, no abdominal pain, no dyspnea with exertion, no light-headedness, no loss of consciousness, no difficulty swallowing, no fevers, no chills, no diarrhea, no blood in stool, no pain urinating, no blood in urine, no sensation of incomplete bladder emptying.     Otherwise as per HPI. All other systems are reviewed and are negative.      Physical Exam     BP 104/57 (BP Site: L Arm, BP Position: Sitting, BP Cuff Size: Medium)  - Pulse 70  - Temp 36.7 ??C (98.1 ??F) (Tympanic)  - Ht 170.2 cm (5' 7)  - Wt 76.7 kg (169 lb 3.2 oz)  - BMI 26.50 kg/m??   Constitutional:  Well-appearing in NAD  Eyes:  anicteric sclerae  ENT:  MMM  CV:  RRR, no m/r/g, no JVD, extremities WWP with no edema  Resp:  Good air movement, CTAB  GI:  Abdomen soft, NTND, +bs  MSK:  Grossly normal, exam is limited  Skin:  Normal turgor, no rash  Neuro:  Grossly normal, exam is limited  Psych:  Normal affect  Dialysis access: none            Laboratory Results and Imaging Data Reviewed in EPIC      We reviewed renal transplantation concepts with the patient in detail, including:   1) Kidney donor types including living donors, deceased donors, and HCV positive deceased donors  2) Increase risk of diabetes post transplant.   3) Increase risk of infections post surgery.   4) Increase risk of cancers.   5) Increase risk of heart attack especially during the first 3 months after surgery.   6) The possibility of needing dialysis post transplant.   7) Need for a kidney biopsy post transplant and treatment with medications as required.   8) Importance of being compliant with the medications and follow up appointment with physicians and other members of the transplant team.   9) Side effects of immunosuppression.          Assessment:    60 y.o. 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    Concerns:  - smoking cessation and lung cancer screening  - abnormal breast biopsy, intraductal papilloma Jan 2020, requires followup mammogram  - needs Pap (is s/p partial hysterectomy, no Pap for many years, needs prior to listing)  - best to have dermatology and rheumatology comment on potential for coordination of immunosuppression plans post-transplant    Her daughter is potentially interested in living kidney donation.    Recommendations/Plan:  -Perioperative cardiovascular risk assessment including echocardiogram, ECG, and stress testing.  -CT Abd/Pelvis to assess iliac circulation for vascular disease burden and targets for kidney implantation  - Age appropriate cancer screening including:   - update Pap   - update mammogram  -Transplant Social Work evaluation  - low dose chest CT (lung cancer screening per USPSTF guidelines)  - rheum and dermatology comment on post-transplant psoriasis management  - smoking cessation is strongly advised  - mammogram and any ensuing followup  - recommend establishing co-management of DM with endocrinology and PCP, given her A1C 9.9%. May benefit from DM technology (CGM or insulin pump) for improved glycemic control.  - pneumococcal vaccine recommended (PCV20 vs PCV13+PPSV23)  - COVID vaccine booster recommended (eg dose #4)   - dental evaluation is recommended      Leafy Half, MD  Division of Nephrology and Hypertension  [facility]  08/27/2020  9:43 AM

## 2020-08-27 NOTE — Unmapped (Signed)
Patient attended kidney transplant orientation class at [facility] today and was instructed to attend the rest of their scheduled appointments. Orientation class was 1 hour and 0 minutes long.  First half of patient education checklist signed today.    Patient did sign A2 or A2B consent(s).

## 2020-08-28 LAB — TB MITOGEN: TB MITOGEN VALUE: 10

## 2020-08-28 LAB — QUANTIFERON TB GOLD PLUS
QUANTIFERON ANTIGEN 1 MINUS NIL: 0.01 [IU]/mL
QUANTIFERON ANTIGEN 2 MINUS NIL: 0.01 [IU]/mL
QUANTIFERON MITOGEN: 9.95 [IU]/mL
QUANTIFERON TB GOLD PLUS: NEGATIVE
QUANTIFERON TB NIL VALUE: 0.05 [IU]/mL

## 2020-08-28 LAB — BETA-2 GLYCOPROTEIN ANTIBODIES
BETA-2 GLYCOPROTEIN 1 IGG ANTIBODY: 2 (ref <20)
BETA-2 GLYCOPROTEIN 1 IGM ANTIBODY: 10 (ref <20)

## 2020-08-28 LAB — HEPATITIS C ANTIBODY: HEPATITIS C ANTIBODY: NONREACTIVE

## 2020-08-28 LAB — HEPATITIS B SURFACE ANTIBODY
HEPATITIS B SURFACE ANTIBODY QUANT: <8 m[IU]/mL (ref <8.00)
HEPATITIS B SURFACE ANTIBODY: NONREACTIVE

## 2020-08-28 LAB — TB AG1: TB AG1 VALUE: 0.06

## 2020-08-28 LAB — TB NIL: TB NIL VALUE: 0.05

## 2020-08-28 LAB — HEPATITIS A IGG: HEPATITIS A IGG: REACTIVE — AB

## 2020-08-28 LAB — HEPATITIS B CORE ANTIBODY, TOTAL: HEPATITIS B CORE TOTAL ANTIBODY: NONREACTIVE

## 2020-08-28 LAB — SYPHILIS SCREEN: SYPHILIS RPR SCREEN: NONREACTIVE

## 2020-08-28 LAB — HLA ANTIBODY SCREEN

## 2020-08-28 LAB — HEPATITIS B SURFACE ANTIGEN: HEPATITIS B SURFACE ANTIGEN: NONREACTIVE

## 2020-08-28 LAB — CARDIOLIPIN ANTIBODY, IGM/IGG
ANTICARDIOLIPIN IGG ANTIBODY: 5 [GPL'U] (ref 0–23)
ANTICARDIOLIPIN IGM ANTIBODY: 7 [MPL'U] (ref 0–11)

## 2020-08-28 LAB — HIV ANTIGEN/ANTIBODY COMBO: HIV ANTIGEN/ANTIBODY COMBO: NONREACTIVE

## 2020-08-28 LAB — TB AG2: TB AG2 VALUE: 0.06

## 2020-08-31 LAB — LUPUS INHIBITOR PANEL
DILUTE RUSSELL VIPER VENOM TIME: 46 s (ref 0.0–48.6)
PTT LUPUS ANTICOAGULANT: 37.2 s (ref 0.0–47.8)

## 2020-08-31 LAB — PROTEIN S PANEL: PROTEIN S ANTIGEN FREE: >150 %{normal} (ref 63–161)

## 2020-08-31 LAB — PROTEIN C ACTIVITY: PROTEIN C ACTIVITY: 122 %{normal} (ref 68–170)

## 2020-09-08 LAB — HLA CL I&II, LOW RES
BW #1: 4
BW #2: 4
LOW RES DRW #1: 53
LOW RES DRW #2: 53
LOW RES HLA A #1: 29
LOW RES HLA A #2: 30
LOW RES HLA B #1: 13
LOW RES HLA B #2: 44
LOW RES HLA C #1: 6
LOW RES HLA C #2: 16
LOW RES HLA DQ#1: 2
LOW RES HLA DQ#2: 2
LOW RES HLA DR#1: 7
LOW RES HLA DR#2: 7

## 2020-09-08 LAB — HLA ANTIBODY SCREEN C1: HLA C1 AB SCR: POSITIVE

## 2020-09-08 LAB — HLA ANTIBODY SCREEN C2: HLA C2 AB SCR: POSITIVE

## 2020-09-08 MED ORDER — NOVOLOG FLEXPEN U-100 INSULIN ASPART 100 UNIT/ML (3 ML) SUBCUTANEOUS
0 days
Start: 2020-09-08 — End: ?

## 2020-09-08 MED ORDER — PREGABALIN 25 MG CAPSULE
0 days
Start: 2020-09-08 — End: ?

## 2020-09-09 LAB — FSAB CLASS 1 ANTIBODY SPECIFICITY
CPRA%: 99
HLA CLASS 1 ANTIBODY RESULT: POSITIVE

## 2020-09-09 LAB — FSAB CLASS 2 ANTIBODY SPECIFICITY: HLA CL2 AB RESULT: POSITIVE

## 2020-09-15 ENCOUNTER — Ambulatory Visit: Admit: 2020-09-15 | Discharge: 2020-09-16 | Payer: PRIVATE HEALTH INSURANCE

## 2020-09-15 DIAGNOSIS — Z79899 Other long term (current) drug therapy: Principal | ICD-10-CM

## 2020-09-15 DIAGNOSIS — L409 Psoriasis, unspecified: Principal | ICD-10-CM

## 2020-09-15 NOTE — Unmapped (Signed)
Dermatology Note     Assessment and Plan:    Psoriasis, 5-10% BSA involvement: significant improvement on Stelara  - s/p 2 loading doses of Stelara. Pt deferred refills of topicals today  - Previously tried [place] and Humira with minimal improvement and concern for increased risk of infection with the Humira.   - Continue clobetasol 0.05% ointment BID to thicker plaques until clear  - Continue TAC 0.1% ointment BID to thinner plaques??  - Continue Stelara    - ustekinumab (STELARA) 90 mg/mL Syrg syringe; Inject 1 dose at week 0 and Week 4 then every 12 weeks t Hereafter Loading Dose  Dispense: 2 mL; Refill: 0   - ustekinumab (STELARA) 90 mg/mL Syrg syringe; Maintenance dose: q12 weeks, starting at week 16  Dispense: 1  mL; Refill: 4  ??  High Risk Medication- Ustekinumab  - Quantiferon Gold negative Feb 2022    The patient was advised to call for an appointment should any new, changing, or symptomatic lesions develop.     RTC: Return in about 6 months (around 03/18/2021) for psoriasis. or sooner as needed   _________________________________________________________________      Chief Complaint     Chief Complaint   Patient presents with   ??? Psoriasis     Pt here for f/u Psoraisis, skin doing well no new cncerns        HPI     Amy [name] is a 60 y.o. female who presents as a returning patient (last seen 06/16/2020) to [facility] for follow up of psoriasis. At that time she was seen for psoriasis which was flaring.  Prior treatments have included Otezla and Humira.  At last visit we started her on Stelara.  Topical medications include triamcinolone and clobetasol.  Since seeing [place], she is being evaluated for a potential kidney transplant.    Today notes she is doing very well. Skin is almost clear. Notes chronic joint pains of elbows and back but denies other joint pains. Denies injection site pain or redness or swelling. She has done 2 Stelara injections      The patient denies any other new or changing lesions or areas of concern.     Pertinent Past Medical History     Psoriasis  Diabetes  HTN  Alport's disease  No history of skin cancer  Current smoker    For history, she was first seen by [place] on 02/08/2017 and was initially started on clobetasol only. ??She was subsequently started on apremilast on 09/25/2017. ??She was started on Humira on 12/04/2018. ??She had a biopsy on 12/04/2018 which was suggestive of psoriasis. 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 ??She had a negative PPD test on 02/13/2019.  In addition to seeing me, she is also previously seen Dr. Carles Collet and Dr. Orma Flaming.  A biopsy was done on 12/04/2018 which was consistent with psoriasis.      Problem List     None            Family History:   Negative for melanoma    Past Medical History, Family History, Social History, Medication List, Allergies, and Problem List were reviewed in the rooming section of Epic.     ROS: Other than symptoms mentioned in the HPI, no fevers, chills, or other skin complaints    Physical Examination     GENERAL: Well-appearing female in no acute distress, resting comfortably.  NEURO: Alert and oriented, answers questions appropriately  PSYCH: Normal mood and  affect  SKIN: Examination of the chest, bilateral upper extremities and bilateral lower extremities was performed  -- scaly papules on extensor elbows. No other psoriasiform lesions    All areas not commented on are within normal limits or unremarkable      (Approved Template 01/27/2020)

## 2020-09-15 NOTE — Unmapped (Signed)
PCP:  Amy Morgan, MD    09/15/2020  [facility] [address] [place]  [address]  [place] [address]  Dept: [phone]  Loc: [phone]    Chief Complaint: Alport syndrome, elevated creatinine    HPI:  [name] is a 60 y.o. 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 Her mother also had renal disease but it is unknown if this was due to Alports.     She has uncontrolled type 2 diabetes and is not checking her glucose regularly - her A1c was 8.4% in September 2020 and most recently was 9.9% (06/09/2020). She does have a history of diabetic neuropathy but no known history of diabetic retinopathy. Recently started on Jardiance.     She did suffer from kidney stones in 1998 however has not had any issues with this since.    She was referred to [facility] for transplant but was denied due to financial concerns. They recommended we refer her to [facility]. She met with them since our LOV    She feels ok today, but does note some back pain that radiates to her left leg. She feels this is a pinched nerve. She knows to avoid NSAIDS.     ROS:   CONSTITUTIONAL: denies fevers or chills, denies unintentional weight loss   CARDIOVASCULAR: denies chest pain, denies dyspnea on exertion, denies leg edema  GASTROINTESTINAL: denies nausea, denies vomiting, denies anorexia  GENITOURINARY: denies dysuria, denies hematuria, denies decreased urinary stream  All 10 systems reviewed and are negative except as listed above.    PAST MEDICAL HISTORY:  Past Medical History:   Diagnosis Date   ??? Alport syndrome    ??? Back pain    ??? Chronic kidney disease    ??? Dental abscess    ??? Diabetes mellitus (CMS-HCC)     type 2, don't remember A1C, controlled with insulin injections; okay per physician at last visit   ??? Hyperlipidemia    ??? Hypertension     well controlled with meds   ??? Kidney stone     no problems since 1995   ??? Psoriasis        ALLERGIES  Sulfur-8, Glucophage [metformin], Codeine, and Morphine    FAMILY HISTORY  Family History   Problem Relation Age of Onset   ??? Kidney disease Mother    ??? Diabetes Mother    ??? Cataracts Mother    ??? Heart attack Father    ??? Cancer Paternal Grandmother    ??? Breast cancer Neg Hx    ??? Colon cancer Neg Hx    ??? Endometrial cancer Neg Hx    ??? Ovarian cancer Neg Hx    ??? Melanoma Neg Hx    ??? Basal cell carcinoma Neg Hx    ??? Squamous cell carcinoma Neg Hx        SOCIAL HISTORY   reports that she has been smoking. She has been smoking about 1.00 pack per day. She has never used smokeless tobacco. She reports that she does not drink alcohol and does not use drugs.    MEDICATIONS:  Current Outpatient Medications   Medication Sig Dispense Refill   ??? aspirin (ECOTRIN) 81 MG tablet Take 81 mg by mouth daily.      ??? betamethasone dipropionate (DIPROLENE) 0.05 % ointment   4   ??? calcipotriene (DOVONEX) 0.005 % cream Apply topically Two (2) times a day. 60 g 2   ???  cholecalciferol, vitamin D3, (VITAMIN D3) 1,000 unit capsule Take 1,000 Units by mouth daily.      ??? clobetasoL (TEMOVATE) 0.05 % ointment Apply to your thick psoriasis areas twice daily until clear 60 g 5   ??? empagliflozin (JARDIANCE) 10 mg tablet Take 10 mg by mouth daily.     ??? empty container Misc Use as directed to dispose of Humira pens. 1 each 2   ??? insulin ASPART (NOVOLOG) 100 unit/mL injection Inject under the skin Three (3) times a day before meals. 10 mL 12   ??? insulin glargine (LANTUS) 100 unit/mL injection Inject 80 Units under the skin nightly.     ??? lisinopril (PRINIVIL,ZESTRIL) 10 MG tablet Take 10 mg by mouth daily.     ??? methocarbamoL (ROBAXIN) 500 MG tablet Take 1 tablet (500 mg total) by mouth Three (3) times a day as needed for up to 30 doses. 30 tablet 0   ??? naproxen (NAPROSYN) 500 MG tablet Take 1 tablet (500 mg total) by mouth 2 (two) times a day with meals. 60 tablet 1   ??? pen needle, diabetic 32 gauge x 5/32 (4 mm) Ndle Inject as directed Three (3) times a day before meals.     ??? pregabalin (LYRICA) 50 MG capsule Take 1 capsule by mouth 3 (three) times a day.  0   ??? rosuvastatin (CRESTOR) 10 MG tablet Take by mouth.     ??? sodium bicarbonate 650 mg tablet Take 1 tablet (650 mg total) by mouth Two (2) times a day. 180 tablet 3   ??? triamcinolone (KENALOG) 0.1 % ointment Apply topically Two (2) times a day. 453.6 g 1   ??? TRUEPLUS PEN NEEDLE 32 gauge x 5/32 Ndle   10   ??? ustekinumab (STELARA) 45 mg/0.5 mL Syrg syringe Inject the contents of 1 syringe (45mg ) under the skin once every 12 weeks. START at week 16 0.5 mL 4   ??? ustekinumab (STELARA) 45 mg/0.5 mL Syrg syringe Inject the contents of 1 syringe (45 mg) under the skin at 0 and 4 weeks, and THEN inject 1 syringe (45mg ) every 12 weeks thereafter 1 mL 1   ??? ustekinumab (STELARA) 90 mg/mL Syrg syringe Inject  the contents of 1 syringe (90mg ) under the skin at week 0 and 4 as loading doses. THEN inject 1 pen (90mg ) every 12 weeks thereafter. 2 mL 0   ??? ustekinumab (STELARA) 90 mg/mL Syrg syringe Inject the contents of 1 syringe (90mg ) under the skin every 12 weeks. START at week 16 for maintenance. 1 mL 4     No current facility-administered medications for this visit.       PHYSICAL EXAM:  Wt Readings from Last 3 Encounters:   08/27/20 76.7 kg (169 lb 3.2 oz)   06/17/20 79.7 kg (175 lb 9.6 oz)   04/13/20 79 kg (174 lb 3.2 oz)     Temp Readings from Last 3 Encounters:   08/27/20 36.7 ??C (98.1 ??F) (Tympanic)   06/17/20 36.5 ??C (97.7 ??F) (Temporal)   04/13/20 35.8 ??C (96.4 ??F)     BP Readings from Last 5 Encounters:   08/27/20 104/57   06/17/20 105/64   04/13/20 133/71   02/23/20 145/65   02/11/20 116/75     Pulse Readings from Last 3 Encounters:   08/27/20 70   06/17/20 85   04/13/20 82       CONSTITUTIONAL: Alert,well appearing, no distress  HEENT: Moist mucous membranes, oropharynx clear without erythema or exudate  EYES: Extra ocular movements intact. Pupils reactive, sclerae anicteric.  NECK: Supple, no lymphadenopathy  CARDIOVASCULAR: Regular, normal S1/S2 heart sounds, no murmurs, no rubs.   PULM: Clear to auscultation bilaterally  GASTROINTESTINAL: Soft, active bowel sounds, nontender  EXTREMITIES: No lower extremity edema bilaterally.   SKIN: No rashes or lesions  NEUROLOGIC: No focal motor or sensory deficits    MEDICAL DECISION MAKING    06/09/2020  Hgb 13.2, hct 38.7  Glu 192, BUN 50, Creatinine 2.93, eGFR 17, Na 139, K 4.8, CO2 18, Ca 9.5  A1c 9.9%      01/24/2018  HbA1c 8.4%  Ferritin 120  B12 981, folic acid 11.8  Hgb 12.9, HCT 38.8  Glucose 95, BUN 46, CR 2.51, EGFR 21  NA 143, K5.0, CL 109, CO2 17, CA 9.7, ALB 4.1    03/28/17  HbA1c 8.2%, cholesterol 275, triglyceride 471  GLU 187, BUN 37, CR 2.12, EGFR 26, NA 139, K5.0, CL 105, CO2 19, CA 9.2    03/03/09  sodium 140, potassium 5.1, chloride 109, BUN 34, creatinine 1.82 with eGFR 30, glucose 99, albumin 4.2  Total cholesterol 251, triglycerides 340 and LDL 146.  WBC 8.3, hemoglobin 14.3, hematocrit 43.9 and platelets 181,000.  Hemoglobin A1c 6.9%.    Renal U/S showed right kidney 9.4cm and left 9.9cm.    Two simple cysts in the left kidney in the mid and lower poles measuring  1.3 and 0.9cm in diameter.     Creatinine   Date Value Ref Range Status   08/27/2020 3.54 (H) 0.60 - 0.80 mg/dL Final   16/02/9603 5.40 (H) 0.57 - 1.00 mg/dL Final   98/03/9146 8.29 (H) 0.60 - 1.00 mg/dL Final   56/21/3086 5.78 (H) 0.60 - 1.00 mg/dL Final   46/96/2952 8.41 (H) 0.60 - 1.00 mg/dL Final        Lab Results   Component Value Date    NA 140 10/16/2017    K 4.8 10/16/2017    CL 109 (H) 10/16/2017    CO2 20.0 (L) 10/16/2017    BUN 41 (H) 08/27/2020    CREATININE 3.54 (H) 08/27/2020    GFRAA 20 (L) 01/18/2019    GFRNONAA 17 (L) 01/18/2019    GLU 180 (H) 10/16/2017    CALCIUM 9.2 10/16/2017    ALBUMIN 3.7 08/27/2020    PHOS 5.9 (H) 06/16/2020       Lab Results   Component Value Date    PTH 196.8 (H) 06/16/2020    CALCIUM 9.2 10/16/2017       Lab Results   Component Value Date BILITOT 0.3 08/27/2020    BILIDIR <0.10 08/27/2020    PROT 7.4 08/27/2020    ALBUMIN 3.7 08/27/2020    ALT 17 08/27/2020    AST 19 08/27/2020    ALKPHOS 113 08/27/2020    GGT 27 08/27/2020     Lab Results   Component Value Date    INR 0.91 08/27/2020    APTT 30.7 08/27/2020       HGB   Date Value Ref Range Status   08/27/2020 13.3 11.3 - 14.9 g/dL Final   32/44/0102 72.5 11.1 - 15.9 g/dL Final   36/64/4034 74.2 12.0 - 16.0 g/dL Final   59/56/3875 64.3 12.0 - 16.0 g/dL Final        Lab Results   Component Value Date    WBC 7.7 08/27/2020    HGB 13.3 08/27/2020    HCT 39.4 08/27/2020    PLT 154 08/27/2020  No results found for: IRON, TIBC, FERRITIN    Lab Results   Component Value Date    A1C 8.6 (H) 08/27/2020       Lab Results   Component Value Date    Color, UA Yellow 02/23/2020    Glucose, UA >1000 mg/dL (A) 56/38/7564    Ketones, UA Negative 02/23/2020    Blood, UA Small (A) 02/23/2020    Nitrite, UA Negative 02/23/2020   ]    No results found for: PROTEINUR, CREATUR  No results found for: PCRATIOUR    Lab Results   Component Value Date    CHOL 191 08/27/2020    A1C 8.6 (H) 08/27/2020    HEPAIGG Reactive (A) 08/27/2020    HEPBCAB Nonreactive 08/27/2020    HEPCAB Nonreactive 08/27/2020         03/03/09, sodium 140, potassium 5.1, chloride  109, BUN 34, creatinine 1.82 with eGFR 30, glucose 99 and albumin  4.2.  ??  Total cholesterol 251, triglycerides 340 and LDL 146.  ??  WBC 8.3, hemoglobin 14.3, hematocrit 43.9 and platelets 181,000.      04/29/08  BUN 44, creatinine 1.86.   Sodium 141, potassium 4.9, calcium 9.4, phosphorus 4.1, albumin  4.0, AST 17, ALT 14, total cholesterol 231, triglycerides 297,  HDL 37, LDL 135.  Hemoglobin 13.8, TSH 1.48.  A1c was 7.5.    12/24/2007   glucose of 131, BUN of 34,   creatinine of 1.79 with an estimated GFR 31, potassium of 4.9, bicarbonate  of 19, calcium of 9.4, phosphorous of 3.9, albumin of 4.0, and hemoglobin  A1c of 7.3.  ??  A renal ultrasound done on 10/10/2007 showed mildly increased echotexture  of bilateral renal cortices, with the right kidney measuring 9.4  x 4.0 x 4.7 cm, and the left kidney measuring 9.4 x 4.2 x 4.2 cm,  with no hydronephrosis.  There are simple-appearing cysts in the  mid to lower pole of the left kidney.        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    Comment:  There is no evidence of a glomerulonephritis. ??Electron microscopy shows marked  thinning of the lamina densa along glomerular basement membranes associated with  an abnormal immunofluorescence staining pattern for collagen type 4 alpha 5 and  to a lesser degree of 3 subunits (mosaic staining). 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 ??The nephropathy has resulted in thinning of peripheral glomerular  capillary walls and glomerular as well tubulointerstitial fibrosis.    ASSESSMENT/PLAN:    Ms.Amy [name] is a 60 y.o. patient with a past medical history significant for DM2, Alport Being seen for reevaluation of her renal disease.     1. 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 Her creatinine is stable at worse at 3.5 when checked in April. Will CTM closely.     2. CKD stage 4 -  I do recommend continuing the lisinopril and emphasizing the importance of hypertension and glucose control. Her renal biopsy did have a degree of interstitial fibrosis and glomerular sclerosis which are likely indicative of damage from these other conditions as well. The thinning of the basement membrane is likely due to the Alport syndrome.    - Her renal function is a little worse than  before.     - We discussed transplant today. She is unhappy that [facility] rejected her due to concerns about her finances. She was told she would need ~$7000 to undergo work up. She has been referred to [facility] and underwent orientation class. She is a little overwhelmed by the amount of testing involved    - We briefly discussed dialysis. She would be a good home therapy candidate. There is no indication for dialysis at this time, she is not uremic. We will send her to meet with the dialysis educator.     3. DM2 - Defer to PCP for management. Metformin contraindicated for EGFR less than 30.    4. Hypertension - currently, this is well-controlled with lisinopril. At this time, I do not recommend any changes to her medication regimen that I would continue her on the ACE inhibitor for renal protection as long as feasible. She is not displaying any signs of hypotension.     5. Acidosis - on NaBicarb 650mg  BID, refilled today. She has been out for 1 month. Typically takes correctly but needed an appt for refill.     6. HyperPTH - start OTC vitamin D today. Lab pending but has a hx of low vitamin D and has been off supplementation    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 Consider adding binder at next OV if no improvement    Ms.Devine Francom will follow up in 3 months      Disclaimer: Much of the narrative of this dictation was acquired using speech recognition software. It is possible that some dictated speech was not transcribed accurately by this system nor detected in the proofing. Such errors are at times unavoidable.

## 2020-09-16 ENCOUNTER — Encounter: Admit: 2020-09-16 | Discharge: 2020-09-17 | Payer: PRIVATE HEALTH INSURANCE

## 2020-09-16 DIAGNOSIS — N2581 Secondary hyperparathyroidism of renal origin: Principal | ICD-10-CM

## 2020-09-16 DIAGNOSIS — E1122 Type 2 diabetes mellitus with diabetic chronic kidney disease: Principal | ICD-10-CM

## 2020-09-16 DIAGNOSIS — Z794 Long term (current) use of insulin: Principal | ICD-10-CM

## 2020-09-16 DIAGNOSIS — D631 Anemia in chronic kidney disease: Principal | ICD-10-CM

## 2020-09-16 DIAGNOSIS — Z01818 Encounter for other preprocedural examination: Principal | ICD-10-CM

## 2020-09-16 DIAGNOSIS — Q8781 Alport syndrome: Principal | ICD-10-CM

## 2020-09-16 DIAGNOSIS — E559 Vitamin D deficiency, unspecified: Principal | ICD-10-CM

## 2020-09-16 DIAGNOSIS — E1165 Type 2 diabetes mellitus with hyperglycemia: Principal | ICD-10-CM

## 2020-09-16 DIAGNOSIS — N184 Chronic kidney disease, stage 4 (severe): Principal | ICD-10-CM

## 2020-09-16 NOTE — Unmapped (Signed)
The dialysis educator will contact you to schedule an appointment to talk about kidney disease and dialysis.

## 2020-10-16 NOTE — Unmapped (Signed)
[facility] Refill Coordination Note    Specialty Medication(s) to be Shipped:   Inflammatory Disorders: Stelara    Other medication(s) to be shipped: No additional medications requested for fill at this time     Amy [name], DOB: [birth year]  Phone: [phone] (home)       All above HIPAA information was verified with patient.     Was a Nurse, learning disability used for this call? No    Completed refill call assessment today to schedule patient's medication shipment from the [facility] [phone]).  All relevant notes have been reviewed.     Specialty medication(s) and dose(s) confirmed: Regimen is correct and unchanged.   Changes to medications: Amy [name] reports starting an antibiotic for a sinus infection  Changes to insurance: No  New side effects reported not previously addressed with a pharmacist or physician: None reported  Questions for the pharmacist: No    Confirmed patient received a Conservation officer, historic buildings and a Surveyor, mining with first shipment. The patient will receive a drug information handout for each medication shipped and additional FDA Medication Guides as required.       DISEASE/MEDICATION-SPECIFIC INFORMATION        For patients on injectable medications: Patient currently has 0 doses left.  Next injection is scheduled for 10/29/2020.    SPECIALTY MEDICATION ADHERENCE     Medication Adherence    Patient reported X missed doses in the last month: 0  Specialty Medication: STELARA 45 mg/0.5 mL  Patient is on additional specialty medications: No  Any gaps in refill history greater than 2 weeks in the last 3 months: no  Demonstrates understanding of importance of adherence: yes  Informant: patient  Reliability of informant: reliable  Confirmed plan for next specialty medication refill: delivery by pharmacy  Refills needed for supportive medications: not needed              Were doses missed due to medication being on hold? No    Stelara 45/0.5 mg/ml: 0 days of medicine on hand       REFERRAL TO PHARMACIST     Referral to the pharmacist: Not needed      [facility]     Shipping address confirmed in Epic.     Delivery Scheduled: Yes, Expected medication delivery date: 10/22/2020.     Medication will be delivered via Same Day Courier to the prescription address in Epic WAM.    Karsyn Jamie D Adham Johnson   [facility] Specialty Technician

## 2020-10-22 MED FILL — STELARA 45 MG/0.5 ML SUBCUTANEOUS SYRINGE: 84 days supply | Qty: 0.5 | Fill #0

## 2020-11-18 DIAGNOSIS — N186 End stage renal disease: Principal | ICD-10-CM

## 2020-11-18 DIAGNOSIS — Z01818 Encounter for other preprocedural examination: Principal | ICD-10-CM

## 2020-11-18 DIAGNOSIS — Z01810 Encounter for preprocedural cardiovascular examination: Principal | ICD-10-CM

## 2020-11-18 DIAGNOSIS — F17200 Nicotine dependence, unspecified, uncomplicated: Principal | ICD-10-CM

## 2020-12-11 ENCOUNTER — Institutional Professional Consult (permissible substitution): Admit: 2020-12-11 | Discharge: 2020-12-12 | Payer: PRIVATE HEALTH INSURANCE

## 2020-12-11 DIAGNOSIS — N184 Chronic kidney disease, stage 4 (severe): Principal | ICD-10-CM

## 2020-12-11 DIAGNOSIS — D631 Anemia in chronic kidney disease: Principal | ICD-10-CM

## 2020-12-11 DIAGNOSIS — Q8781 Alport syndrome: Principal | ICD-10-CM

## 2020-12-11 DIAGNOSIS — Z01818 Encounter for other preprocedural examination: Principal | ICD-10-CM

## 2020-12-14 LAB — VITAMIN D 25 HYDROXY: VITAMIN D, TOTAL (25OH): 24.8 ng/mL (ref 20.0–80.0)

## 2020-12-16 ENCOUNTER — Encounter: Admit: 2020-12-16 | Discharge: 2020-12-17 | Payer: PRIVATE HEALTH INSURANCE

## 2020-12-16 DIAGNOSIS — N2581 Secondary hyperparathyroidism of renal origin: Principal | ICD-10-CM

## 2020-12-16 DIAGNOSIS — N184 Chronic kidney disease, stage 4 (severe): Principal | ICD-10-CM

## 2020-12-16 DIAGNOSIS — D631 Anemia in chronic kidney disease: Principal | ICD-10-CM

## 2020-12-16 MED ORDER — SODIUM BICARBONATE 650 MG TABLET
ORAL_TABLET | Freq: Two times a day (BID) | ORAL | 3 refills | 90 days | Status: CP
Start: 2020-12-16 — End: 2021-12-16

## 2020-12-16 NOTE — Unmapped (Signed)
PCP:  Emogene Morgan, MD    12/16/2020  [facility] [address] [place]  [address]  [place] [address]  Dept: [phone]  Loc: [phone]    Chief Complaint: Alport syndrome, elevated creatinine    HPI:  [name] is a 60 y.o. 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 Her mother also had renal disease but it is unknown if this was due to Alports.     She has uncontrolled type 2 diabetes and is not checking her glucose regularly - her A1c was 8.4% in September 2020 and most recently was 9.9% (06/09/2020). She does have a history of diabetic neuropathy but no known history of diabetic retinopathy. Recently started on Jardiance.     She did suffer from kidney stones in 1998 however has not had any issues with this since.    She was referred to [facility] for transplant but was denied due to financial concerns. They recommended we refer her to [facility]. She met with them since our LOV. She went through the orientation class but has not heard from them since.     She has also spoken with [facility].     She feels ok today, but does note some back pain that radiates to her left leg. She feels this is a pinched nerve. She knows to avoid NSAIDS.     ROS:   CONSTITUTIONAL: denies fevers or chills, denies unintentional weight loss   CARDIOVASCULAR: denies chest pain, denies dyspnea on exertion, denies leg edema  GASTROINTESTINAL: denies nausea, denies vomiting, denies anorexia  GENITOURINARY: denies dysuria, denies hematuria, denies decreased urinary stream  All 10 systems reviewed and are negative except as listed above.    PAST MEDICAL HISTORY:  Past Medical History:   Diagnosis Date   ??? Alport syndrome    ??? Back pain    ??? Chronic kidney disease    ??? Dental abscess    ??? Diabetes mellitus (CMS-HCC)     type 2, don't remember A1C, controlled with insulin injections; okay per physician at last visit   ??? Hyperlipidemia    ??? Hypertension     well controlled with meds   ??? Kidney stone     no problems since 1995   ??? Psoriasis        ALLERGIES  Sulfur-8, Glucophage [metformin], Codeine, and Morphine    FAMILY HISTORY  Family History   Problem Relation Age of Onset   ??? Kidney disease Mother    ??? Diabetes Mother    ??? Cataracts Mother    ??? Heart attack Father    ??? Cancer Paternal Grandmother    ??? Breast cancer Neg Hx    ??? Colon cancer Neg Hx    ??? Endometrial cancer Neg Hx    ??? Ovarian cancer Neg Hx    ??? Melanoma Neg Hx    ??? Basal cell carcinoma Neg Hx    ??? Squamous cell carcinoma Neg Hx        SOCIAL HISTORY   reports that she has been smoking. She has been smoking about 1.00 pack per day. She has never used smokeless tobacco. She reports that she does not drink alcohol and does not use drugs.    MEDICATIONS:  Current Outpatient Medications   Medication Sig Dispense Refill   ??? aspirin (ECOTRIN) 81 MG tablet Take 81 mg by mouth daily.      ??? betamethasone dipropionate (DIPROLENE) 0.05 % ointment  4   ??? calcipotriene (DOVONEX) 0.005 % cream Apply topically Two (2) times a day. 60 g 2   ??? cholecalciferol, vitamin D3-25 mcg, 1,000 unit,, 25 mcg (1,000 unit) capsule Take 1,000 Units by mouth daily.      ??? clobetasoL (TEMOVATE) 0.05 % ointment Apply to your thick psoriasis areas twice daily until clear 60 g 5   ??? empagliflozin (JARDIANCE) 10 mg tablet Take 10 mg by mouth daily.     ??? empty container Misc Use as directed to dispose of Humira pens. 1 each 2   ??? insulin glargine (LANTUS) 100 unit/mL injection Inject 80 Units under the skin nightly.     ??? lisinopril (PRINIVIL,ZESTRIL) 10 MG tablet Take 10 mg by mouth daily.     ??? methocarbamoL (ROBAXIN) 500 MG tablet Take 1 tablet (500 mg total) by mouth Three (3) times a day as needed for up to 30 doses. 30 tablet 0   ??? NOVOLOG FLEXPEN U-100 INSULIN 100 unit/mL (3 mL) injection pen      ??? pen needle, diabetic 32 gauge x 5/32 (4 mm) Ndle Inject as directed Three (3) times a day before meals.     ??? pregabalin (LYRICA) 25 MG capsule ??? sodium bicarbonate 650 mg tablet Take 1 tablet (650 mg total) by mouth Two (2) times a day. 180 tablet 3   ??? triamcinolone (KENALOG) 0.1 % ointment Apply topically Two (2) times a day. 453.6 g 1   ??? TRUEPLUS PEN NEEDLE 32 gauge x 5/32 Ndle   10   ??? ustekinumab (STELARA) 45 mg/0.5 mL Syrg syringe Inject the contents of 1 syringe (45mg ) under the skin once every 12 weeks. START at week 16 0.5 mL 4   ??? ustekinumab (STELARA) 45 mg/0.5 mL Syrg syringe Inject the contents of 1 syringe (45 mg) under the skin at 0 and 4 weeks, and THEN inject 1 syringe (45mg ) every 12 weeks thereafter 1 mL 1   ??? ustekinumab (STELARA) 90 mg/mL Syrg syringe Inject  the contents of 1 syringe (90mg ) under the skin at week 0 and 4 as loading doses. THEN inject 1 pen (90mg ) every 12 weeks thereafter. 2 mL 0   ??? ustekinumab (STELARA) 90 mg/mL Syrg syringe Inject the contents of 1 syringe (90mg ) under the skin every 12 weeks. START at week 16 for maintenance. 1 mL 4   ??? pregabalin (LYRICA) 50 MG capsule Take 1 capsule by mouth 3 (three) times a day. (Patient not taking: Reported on 12/16/2020)  0   ??? rosuvastatin (CRESTOR) 10 MG tablet Take by mouth.       No current facility-administered medications for this visit.       PHYSICAL EXAM:  Wt Readings from Last 3 Encounters:   12/16/20 76.8 kg (169 lb 4.8 oz)   09/16/20 78.3 kg (172 lb 11.2 oz)   08/27/20 76.7 kg (169 lb 3.2 oz)     Temp Readings from Last 3 Encounters:   09/16/20 36.6 ??C (97.8 ??F) (Temporal)   08/27/20 36.7 ??C (98.1 ??F) (Tympanic)   06/17/20 36.5 ??C (97.7 ??F) (Temporal)     BP Readings from Last 5 Encounters:   12/16/20 109/65   09/16/20 121/71   08/27/20 104/57   06/17/20 105/64   04/13/20 133/71     Pulse Readings from Last 3 Encounters:   12/16/20 77   09/16/20 78   08/27/20 70       CONSTITUTIONAL: Alert,well appearing, no distress  HEENT: Moist mucous membranes, oropharynx  clear without erythema or exudate  EYES: Extra ocular movements intact. Pupils reactive, sclerae anicteric.  NECK: Supple, no lymphadenopathy  CARDIOVASCULAR: Regular, normal S1/S2 heart sounds, no murmurs, no rubs.   PULM: Clear to auscultation bilaterally  GASTROINTESTINAL: Soft, active bowel sounds, nontender  EXTREMITIES: No lower extremity edema bilaterally.   SKIN: No rashes or lesions  NEUROLOGIC: No focal motor or sensory deficits    MEDICAL DECISION MAKING    06/09/2020  Hgb 13.2, hct 38.7  Glu 192, BUN 50, Creatinine 2.93, eGFR 17, Na 139, K 4.8, CO2 18, Ca 9.5  A1c 9.9%      01/24/2018  HbA1c 8.4%  Ferritin 120  B12 981, folic acid 11.8  Hgb 12.9, HCT 38.8  Glucose 95, BUN 46, CR 2.51, EGFR 21  NA 143, K5.0, CL 109, CO2 17, CA 9.7, ALB 4.1    03/28/17  HbA1c 8.2%, cholesterol 275, triglyceride 471  GLU 187, BUN 37, CR 2.12, EGFR 26, NA 139, K5.0, CL 105, CO2 19, CA 9.2    03/03/09  sodium 140, potassium 5.1, chloride 109, BUN 34, creatinine 1.82 with eGFR 30, glucose 99, albumin 4.2  Total cholesterol 251, triglycerides 340 and LDL 146.  WBC 8.3, hemoglobin 14.3, hematocrit 43.9 and platelets 181,000.  Hemoglobin A1c 6.9%.    Renal U/S showed right kidney 9.4cm and left 9.9cm.    Two simple cysts in the left kidney in the mid and lower poles measuring  1.3 and 0.9cm in diameter.     Creatinine   Date Value Ref Range Status   12/11/2020 3.57 (H) 0.60 - 0.80 mg/dL Final   16/02/9603 5.40 (H) 0.60 - 0.80 mg/dL Final   98/03/9146 8.29 (H) 0.57 - 1.00 mg/dL Final   56/21/3086 5.78 (H) 0.60 - 1.00 mg/dL Final   46/96/2952 8.41 (H) 0.60 - 1.00 mg/dL Final   32/44/0102 7.25 (H) 0.60 - 1.00 mg/dL Final        Lab Results   Component Value Date    NA 138 12/11/2020    K 4.7 12/11/2020    CL 110 (H) 12/11/2020    CO2 17.0 (L) 12/11/2020    BUN 52 (H) 12/11/2020    CREATININE 3.57 (H) 12/11/2020    GFRAA 20 (L) 01/18/2019    GFRNONAA 17 (L) 01/18/2019    GLU 91 12/11/2020    CALCIUM 9.5 12/11/2020    ALBUMIN 3.8 12/11/2020    PHOS 6.4 (H) 12/11/2020       Lab Results   Component Value Date    PTH 131.1 (H) 12/11/2020    CALCIUM 9.5 12/11/2020       Lab Results   Component Value Date    BILITOT 0.3 08/27/2020    BILIDIR <0.10 08/27/2020    PROT 7.4 08/27/2020    ALBUMIN 3.8 12/11/2020    ALT 17 08/27/2020    AST 19 08/27/2020    ALKPHOS 113 08/27/2020    GGT 27 08/27/2020     Lab Results   Component Value Date    INR 0.91 08/27/2020    APTT 30.7 08/27/2020       HGB   Date Value Ref Range Status   08/27/2020 13.3 11.3 - 14.9 g/dL Final   36/64/4034 74.2 11.1 - 15.9 g/dL Final   59/56/3875 64.3 12.0 - 16.0 g/dL Final   32/95/1884 16.6 12.0 - 16.0 g/dL Final        Lab Results   Component Value Date    WBC 7.7 08/27/2020  HGB 13.3 08/27/2020    HCT 39.4 08/27/2020    PLT 154 08/27/2020     No results found for: IRON, TIBC, FERRITIN    Lab Results   Component Value Date    A1C 8.6 (H) 08/27/2020       Lab Results   Component Value Date    Color, UA Yellow 02/23/2020    Protein, Ur 98.4 12/11/2020    Glucose, UA >1000 mg/dL (A) 16/02/9603    Ketones, UA Negative 02/23/2020    Blood, UA Small (A) 02/23/2020    Nitrite, UA Negative 02/23/2020   ]    Lab Results   Component Value Date    PROTEINUR 98.4 12/11/2020    CREATUR 80.0 12/11/2020     Lab Results   Component Value Date    PCRATIOUR 1.230 12/11/2020       Lab Results   Component Value Date    CHOL 191 08/27/2020    A1C 8.6 (H) 08/27/2020    HEPAIGG Reactive (A) 08/27/2020    HEPBCAB Nonreactive 08/27/2020    HEPCAB Nonreactive 08/27/2020         03/03/09, sodium 140, potassium 5.1, chloride  109, BUN 34, creatinine 1.82 with eGFR 30, glucose 99 and albumin  4.2.  ??  Total cholesterol 251, triglycerides 340 and LDL 146.  ??  WBC 8.3, hemoglobin 14.3, hematocrit 43.9 and platelets 181,000.      04/29/08  BUN 44, creatinine 1.86.   Sodium 141, potassium 4.9, calcium 9.4, phosphorus 4.1, albumin  4.0, AST 17, ALT 14, total cholesterol 231, triglycerides 297,  HDL 37, LDL 135.  Hemoglobin 13.8, TSH 1.48.  A1c was 7.5.    12/24/2007   glucose of 131, BUN of 34,   creatinine of 1.79 with an estimated GFR 31, potassium of 4.9, bicarbonate  of 19, calcium of 9.4, phosphorous of 3.9, albumin of 4.0, and hemoglobin  A1c of 7.3.  ??  A renal ultrasound done on 10/10/2007 showed mildly increased echotexture  of bilateral renal cortices, with the right kidney measuring 9.4  x 4.0 x 4.7 cm, and the left kidney measuring 9.4 x 4.2 x 4.2 cm,  with no hydronephrosis.  There are simple-appearing cysts in the  mid to lower pole of the left kidney.        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    Comment:  There is no evidence of a glomerulonephritis. ??Electron microscopy shows marked  thinning of the lamina densa along glomerular basement membranes associated with  an abnormal immunofluorescence staining pattern for collagen type 4 alpha 5 and  to a lesser degree of 3 subunits (mosaic staining). 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 ??The nephropathy has resulted in thinning of peripheral glomerular  capillary walls and glomerular as well tubulointerstitial fibrosis.    ASSESSMENT/PLAN:    Ms.Amy [name] is a 60 y.o. patient with a past medical history significant for DM2, Alport Being seen for reevaluation of her renal disease.     1. 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 Her creatinine is stable at worse at 3.5 when checked in April but stable since then.      2. CKD stage 4 -  I do recommend continuing the lisinopril and emphasizing the importance  of hypertension and glucose control. Her renal biopsy did have a degree of interstitial fibrosis and glomerular sclerosis which are likely indicative of damage from these other conditions as well. The thinning of the basement membrane is likely due to the Alport syndrome.    - Her renal function is a little worse than before.     - We discussed transplant today. She is unhappy that [facility] rejected her due to concerns about her finances. She was told she would need ~$7000 to undergo work up. She has been referred to [facility] and underwent orientation class. She is a little overwhelmed by the amount of testing involved. She has not scheduled anything at this time. I will email.     - We briefly discussed dialysis. She would be a good home therapy candidate. There is no indication for dialysis at this time, she is not uremic. We will send her to meet with the dialysis educator. Referral re-sent.     3. DM2 - Defer to PCP for management. Metformin contraindicated for EGFR less than 30.    4. Hypertension - currently, this is well-controlled with lisinopril. At this time, I do not recommend any changes to her medication regimen that I would continue her on the ACE inhibitor for renal protection as long as feasible. She is not displaying any signs of hypotension.     5. Acidosis - on NaBicarb 650mg  BID. Increase to 2 tabs BID    6. HyperPTH - start OTC vitamin D today. Lab pending but has a hx of low vitamin D and has been off supplementation    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 Consider adding binder at next OV if no improvement    Ms.Amy [name] will follow up in 3 months with a RFP, Hgb and A1c      Disclaimer: Much of the narrative of this dictation was acquired using speech recognition software. It is possible that some dictated speech was not transcribed accurately by this system nor detected in the proofing. Such errors are at times unavoidable.

## 2021-01-06 NOTE — Unmapped (Signed)
Amy [name] reports her psoriasis is stable - she continues to have some patches on her elbows, and this seems to get a little worse as her next injection becomes due. Overall, she thinks she's had better results than with [name] or Humira.    She continues to use topicals PRN and has a good supply.     [facility] Clinical Assessment & Refill Coordination Note    Amy [name], DOB: [birth year]  Phone: [phone] (home)     All above HIPAA information was verified with patient.     Was a Nurse, learning disability used for this call? No    Specialty Medication(s):   Inflammatory Disorders: Stelara     Current Outpatient Medications   Medication Sig Dispense Refill   ??? aspirin (ECOTRIN) 81 MG tablet Take 81 mg by mouth daily.      ??? betamethasone dipropionate (DIPROLENE) 0.05 % ointment   4   ??? calcipotriene (DOVONEX) 0.005 % cream Apply topically Two (2) times a day. 60 g 2   ??? cholecalciferol, vitamin D3-25 mcg, 1,000 unit,, 25 mcg (1,000 unit) capsule Take 1,000 Units by mouth daily.      ??? clobetasoL (TEMOVATE) 0.05 % ointment Apply to your thick psoriasis areas twice daily until clear 60 g 5   ??? empagliflozin (JARDIANCE) 10 mg tablet Take 10 mg by mouth daily.     ??? empty container Misc Use as directed to dispose of Humira pens. 1 each 2   ??? insulin glargine (LANTUS) 100 unit/mL injection Inject 80 Units under the skin nightly.     ??? lisinopril (PRINIVIL,ZESTRIL) 10 MG tablet Take 10 mg by mouth daily.     ??? methocarbamoL (ROBAXIN) 500 MG tablet Take 1 tablet (500 mg total) by mouth Three (3) times a day as needed for up to 30 doses. 30 tablet 0   ??? NOVOLOG FLEXPEN U-100 INSULIN 100 unit/mL (3 mL) injection pen      ??? pen needle, diabetic 32 gauge x 5/32 (4 mm) Ndle Inject as directed Three (3) times a day before meals.     ??? pregabalin (LYRICA) 25 MG capsule      ??? pregabalin (LYRICA) 50 MG capsule Take 1 capsule by mouth 3 (three) times a day. (Patient not taking: Reported on 12/16/2020)  0   ??? rosuvastatin (CRESTOR) 10 MG tablet Take by mouth.     ??? sodium bicarbonate 650 mg tablet Take 2 tablets (1,300 mg total) by mouth Two (2) times a day. 360 tablet 3   ??? triamcinolone (KENALOG) 0.1 % ointment Apply topically Two (2) times a day. 453.6 g 1   ??? TRUEPLUS PEN NEEDLE 32 gauge x 5/32 Ndle   10   ??? ustekinumab (STELARA) 45 mg/0.5 mL Syrg syringe Inject the contents of 1 syringe (45mg ) under the skin once every 12 weeks. START at week 16 0.5 mL 4   ??? ustekinumab (STELARA) 45 mg/0.5 mL Syrg syringe Inject the contents of 1 syringe (45 mg) under the skin at 0 and 4 weeks, and THEN inject 1 syringe (45mg ) every 12 weeks thereafter 1 mL 1   ??? ustekinumab (STELARA) 90 mg/mL Syrg syringe Inject  the contents of 1 syringe (90mg ) under the skin at week 0 and 4 as loading doses. THEN inject 1 pen (90mg ) every 12 weeks thereafter. 2 mL 0   ??? ustekinumab (STELARA) 90 mg/mL Syrg syringe Inject the contents of 1 syringe (90mg ) under the skin every 12 weeks. START at  week 16 for maintenance. 1 mL 4     No current facility-administered medications for this visit.        Changes to medications: Amy [name] reports no changes at this time.    Allergies   Allergen Reactions   ??? Sulfur-8 Nausea Only   ??? Glucophage [Metformin]    ??? Codeine Itching   ??? Morphine Itching       Changes to allergies: No    SPECIALTY MEDICATION ADHERENCE     Stelara - 0 left  Medication Adherence    Patient reported X missed doses in the last month: 0  Specialty Medication: Stelara          Specialty medication(s) dose(s) confirmed: Regimen is correct and unchanged.     Are there any concerns with adherence? No    Adherence counseling provided? Not needed    CLINICAL MANAGEMENT AND INTERVENTION      Clinical Benefit Assessment:    Do you feel the medicine is effective or helping your condition? Yes    Clinical Benefit counseling provided? Not needed    Adverse Effects Assessment:    Are you experiencing any side effects? No    Are you experiencing difficulty administering your medicine? No    Quality of Life Assessment:    Quality of Life    Rheumatology  Oncology  Dermatology  1. What impact has your specialty medication had on the symptoms of your skin condition (i.e. itchiness, soreness, stinging)?: Some  2. What impact has your specialty medication had on your comfort level with your skin?: Some  Cystic Fibrosis          Have you discussed this with your provider? Not needed    Acute Infection Status:    Acute infections noted within Epic:  No active infections  Patient reported infection: None    Therapy Appropriateness:    Is therapy appropriate? Yes, therapy is appropriate and should be continued    DISEASE/MEDICATION-SPECIFIC INFORMATION      For patients on injectable medications: Patient currently has 0 doses left.  Next injection is scheduled for 9/8.    PATIENT SPECIFIC NEEDS     - Does the patient have any physical, cognitive, or cultural barriers? No    - Is the patient high risk? No    - Does the patient require a Care Management Plan? No     - Does the patient require physician intervention or other additional services (i.e. nutrition, smoking cessation, social work)? No      SHIPPING     Specialty Medication(s) to be Shipped:   Inflammatory Disorders: Stelara    Other medication(s) to be shipped: No additional medications requested for fill at this time     Changes to insurance: No    Delivery Scheduled: Yes, Expected medication delivery date: 9/8.     Medication will be delivered via Same Day Courier to the confirmed prescription address in [facility].    The patient will receive a drug information handout for each medication shipped and additional FDA Medication Guides as required.  Verified that patient has previously received a Conservation officer, historic buildings and a Surveyor, mining.    The patient or caregiver noted above participated in the development of this care plan and knows that they can request review of or adjustments to the care plan at any time.      All of the patient's questions and concerns have been addressed.    Amy [name] A Katrinka Blazing  [facility] Specialty Pharmacist

## 2021-01-08 NOTE — Unmapped (Signed)
-  8/26- Called patient  to discuss scheduling eval appointments. Explained to patient that once I finished scheduling them, I will send patient out an appointment letter.

## 2021-01-21 MED FILL — STELARA 45 MG/0.5 ML SUBCUTANEOUS SYRINGE: 84 days supply | Qty: 0.5 | Fill #1

## 2021-02-05 ENCOUNTER — Ambulatory Visit: Admit: 2021-02-05 | Discharge: 2021-02-06 | Payer: PRIVATE HEALTH INSURANCE

## 2021-02-05 DIAGNOSIS — N189 Chronic kidney disease, unspecified: Principal | ICD-10-CM

## 2021-02-05 DIAGNOSIS — N179 Acute kidney failure, unspecified: Principal | ICD-10-CM

## 2021-02-10 NOTE — Unmapped (Signed)
-----   Message from Trilby Leaver, MD sent at 02/09/2021 11:36 AM EDT -----  CT looks okay for targets. Repeat in 3 years.  ----- Message -----  From: [name], RN  Sent: 02/06/2021  12:05 AM EDT  To: Trilby Leaver, MD    02/05/2021 CT for targets and follow up.  Thanks!        ----- Message -----  From: Interface, Rad Results In  Sent: 02/05/2021  11:05 AM EDT  To: [name], RN

## 2021-02-24 NOTE — Unmapped (Signed)
RHEUMATOLOGY NEW CLINIC NOTE    Assessment/Plan:  Amy [name] is a 60 y.o. female with psoriasis Alport syndrome, CKD stage 4, DM, HTN, here for evaluation of elbow pain right greater than left, low back pain that is chronic. Her elbow pain is medial epicondylitis made worse but overuse and her back pain is mechanical. I explained that I did not see any signs or symptoms of psoriatic arthritis, supported by the fact that her joint pain has not responded to TNF inhibitor or Stelara. She can try a medial epicondylitis band. I offered OT but it is too far from where she lives. Continue tylenol. She can try diclofenac gel for the right elbow. May need Pain Management.      History of Present Illness: The patient was seen in consultation at the request of Amy [name] for the evaluation of joint pain and psoriasis    Primary Care Provider: Emogene Morgan, MD    Amy [name] is a 60 y.o.  female  With Alport syndrome, CKD stage 4, DM, HTN, followed by Dermatology and taking ustekinumab for psosriasis. 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  Psoriasis - continues to have lesions on her elbows. Previous treatment with Humira. Started Stelara a few months ago and seems to be helping.   Had pain in her left elbow and has had pain in her right elbow for 6 months. Also has pain in her right wrist. Has had LBP for years. Not sure what makes the pain better or worse. She takes muscle relaxers for the back pain. Overuse worsens the pain. No small joint swelling. No dactylitis. Takes tylenol. Has diabetic neuropathy pain. Stelara and Humira are not helping her joint pain.    Allergies:  Sulfur-8, Glucophage [metformin], Codeine, and Morphine    Medications:   Outpatient Medications Prior to Visit   Medication Sig Dispense Refill   ??? aspirin (ECOTRIN) 81 MG tablet Take 81 mg by mouth daily.      ??? betamethasone dipropionate (DIPROLENE) 0.05 % ointment   4   ??? calcipotriene (DOVONEX) 0.005 % cream Apply topically Two (2) times a day. 60 g 2   ??? cholecalciferol, vitamin D3-25 mcg, 1,000 unit,, 25 mcg (1,000 unit) capsule Take 1,000 Units by mouth daily.      ??? clobetasoL (TEMOVATE) 0.05 % ointment Apply to your thick psoriasis areas twice daily until clear 60 g 5   ??? empagliflozin (JARDIANCE) 10 mg tablet Take 10 mg by mouth daily.     ??? empty container Misc Use as directed to dispose of Humira pens. 1 each 2   ??? insulin glargine (LANTUS) 100 unit/mL injection Inject 80 Units under the skin nightly.     ??? lisinopril (PRINIVIL,ZESTRIL) 10 MG tablet Take 10 mg by mouth daily.     ??? methocarbamoL (ROBAXIN) 500 MG tablet Take 1 tablet (500 mg total) by mouth Three (3) times a day as needed for up to 30 doses. 30 tablet 0   ??? NOVOLOG FLEXPEN U-100 INSULIN 100 unit/mL (3 mL) injection pen      ??? pen needle, diabetic 32 gauge x 5/32 (4 mm) Ndle Inject as directed Three (3) times a day before meals.     ??? pregabalin (LYRICA) 25 MG capsule      ??? pregabalin (LYRICA) 50 MG capsule Take 1 capsule by mouth 3 (three) times a day. (Patient not taking: Reported on 12/16/2020)  0   ???  rosuvastatin (CRESTOR) 10 MG tablet Take by mouth.     ??? sodium bicarbonate 650 mg tablet Take 2 tablets (1,300 mg total) by mouth Two (2) times a day. 360 tablet 3   ??? triamcinolone (KENALOG) 0.1 % ointment Apply topically Two (2) times a day. 453.6 g 1   ??? TRUEPLUS PEN NEEDLE 32 gauge x 5/32 Ndle   10   ??? ustekinumab (STELARA) 45 mg/0.5 mL Syrg syringe Inject the contents of 1 syringe (45mg ) under the skin once every 12 weeks. START at week 16 0.5 mL 4   ??? ustekinumab (STELARA) 45 mg/0.5 mL Syrg syringe Inject the contents of 1 syringe (45 mg) under the skin at 0 and 4 weeks, and THEN inject 1 syringe (45mg ) every 12 weeks thereafter 1 mL 1   ??? ustekinumab (STELARA) 90 mg/mL Syrg syringe Inject  the contents of 1 syringe (90mg ) under the skin at week 0 and 4 as loading doses. THEN inject 1 pen (90mg ) every 12 weeks thereafter. 2 mL 0   ??? ustekinumab (STELARA) 90 mg/mL Syrg syringe Inject the contents of 1 syringe (90mg ) under the skin every 12 weeks. START at week 16 for maintenance. 1 mL 4     No facility-administered medications prior to visit.       Medical History:  Past Medical History:   Diagnosis Date   ??? Alport syndrome    ??? Back pain    ??? Chronic kidney disease    ??? Dental abscess    ??? Diabetes mellitus (CMS-HCC)     type 2, don't remember A1C, controlled with insulin injections; okay per physician at last visit   ??? Hyperlipidemia    ??? Hypertension     well controlled with meds   ??? Kidney stone     no problems since 1995   ??? Psoriasis        Surgical History:  Past Surgical History:   Procedure Laterality Date   ??? CATARACT EXTRACTION Right 08/29/2017   ??? COLONOSCOPY     ??? cyst removal in breast     ??? HYSTERECTOMY     ??? NEPHROSTOMY     ??? PR COLSC FLX W/RMVL OF TUMOR POLYP LESION SNARE TQ N/A 06/19/2018    Procedure: COLONOSCOPY FLEX; W/REMOV TUMOR/LES BY SNARE;  Surgeon: Jarvis Morgan, MD;  Location: [facility];  Service: Gastroenterology   ??? PR XCAPSL CTRC RMVL INSJ IO LENS PROSTH W/O ECP Right 08/29/2017    Procedure: EXTRACAPSULAR CATARACT REMOVAL W/INSERTION OF INTRAOCULAR LENS PROSTHESIS, MANUAL OR MECHANICAL TECHNIQUE Lens Choice:OD ZCB00 +19.50 [id number] +19.00/+18.00;  Surgeon: O'Rese Bonney Roussel, MD;  Location: [facility] OR [facility];  Service: Ophthalmology   ??? PR XCAPSL CTRC RMVL INSJ IO LENS PROSTH W/O ECP Left 09/19/2017    Procedure: EXTRACAPSULAR CATARACT REMOVAL W/INSERTION OF INTRAOCULAR LENS PROSTHESIS, MANUAL OR MECHANICAL TECHNIQUE Lens Choice: ZCB00 +21.00; ZA9003 +20.00/+19.00;  Surgeon: O'Rese Bonney Roussel, MD;  Location: [facility] OR [facility];  Service: Ophthalmology       Social History:  Social History     Tobacco Use   ??? Smoking status: Current Every Day Smoker     Packs/day: 1.00     Years: 43.00     Pack years: 43.00   ??? Smokeless tobacco: Never Used   Substance Use Topics ??? Alcohol use: No   ??? Drug use: No     Family History:  Family History   Problem Relation Age of Onset   ???  Kidney disease Mother    ??? Diabetes Mother    ??? Cataracts Mother    ??? Heart attack Father    ??? Cancer Paternal Grandmother    ??? Breast cancer Neg Hx    ??? Colon cancer Neg Hx    ??? Endometrial cancer Neg Hx    ??? Ovarian cancer Neg Hx    ??? Melanoma Neg Hx    ??? Basal cell carcinoma Neg Hx    ??? Squamous cell carcinoma Neg Hx      Review of Systems: Please see above in the HPI, the remainder of a 10-system review was unremarkable.    Review of outside records: I have reviewed available records through Epic.     Objective   Vitals:    02/25/21 0854   BP: 120/68   BP Site: L Arm   BP Position: Sitting   BP Cuff Size: Medium   Pulse: 73   Resp: 18   Temp: 36 ??C (96.8 ??F)   TempSrc: Temporal   Weight: 76.7 kg (169 lb 3.2 oz)   Height: 169.5 cm (5' 6.73)     Body mass index is 26.71 kg/m??.  Physical Exam    General:   Well appearing female in no acute distress   Eyes:   PERRL, EOMI, conjunctivae are clear.   ENT:   MMM. Oropharhynx without any erythema or exudate.   Neck:   Supple, full ROM   Heme:  No cervical or supraclavicular lymphadenopathy   Cardiovascular:  RRR.  S1 and S2 normal, without any murmur, rub, or gallop.   Lungs:  Clear to auscultation bilaterally, without wheeze.  Good air movement. Normal work of breathing.   Skin:    Psoriasis on elbows b/l    Psychiatry:   Alert and oriented to person, place, and time   Extremities:   Warm and well perfused. No edema. 2+ pulses.   Musculo Skeletal:   No joint deformity, effusions. Full range of motion in shoulder, elbow, hip, knee, ankle, hands and feet. No evidence of active synovitis in the small joints of the hands or feet.  TTP over the right medial epicondyle  Small heberden's nodes in scattered DIP joints   Neurological:  Cranial nerves III-XII intact. Gait normal. 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

## 2021-02-25 ENCOUNTER — Ambulatory Visit
Admit: 2021-02-25 | Discharge: 2021-02-26 | Payer: PRIVATE HEALTH INSURANCE | Attending: Internal Medicine | Primary: Internal Medicine

## 2021-02-25 DIAGNOSIS — M7701 Medial epicondylitis, right elbow: Principal | ICD-10-CM

## 2021-02-25 DIAGNOSIS — M159 Polyosteoarthritis, unspecified: Principal | ICD-10-CM

## 2021-02-25 DIAGNOSIS — L409 Psoriasis, unspecified: Principal | ICD-10-CM

## 2021-02-25 NOTE — Unmapped (Signed)
Medial epicondylitis brace for your right elbow

## 2021-03-04 ENCOUNTER — Ambulatory Visit: Admit: 2021-03-04 | Discharge: 2021-03-06 | Payer: PRIVATE HEALTH INSURANCE

## 2021-03-04 ENCOUNTER — Ambulatory Visit
Admit: 2021-03-04 | Discharge: 2021-03-05 | Payer: PRIVATE HEALTH INSURANCE | Attending: Student in an Organized Health Care Education/Training Program | Primary: Student in an Organized Health Care Education/Training Program

## 2021-03-04 MED ADMIN — Tc-99m Sestamibi (Cardiolite): 30 | INTRAVENOUS | @ 17:00:00 | Stop: 2021-03-04

## 2021-03-04 MED ADMIN — regadenoson (LEXISCAN) injection: @ 15:00:00 | Stop: 2021-03-04

## 2021-03-04 MED ADMIN — Tc-99m Sestamibi (Cardiolite): 11 | INTRAVENOUS | @ 15:00:00 | Stop: 2021-03-04

## 2021-03-04 NOTE — Unmapped (Signed)
ransplant Surgery History and Physical    Assessment/Recommendations:  [name] is a 60 y.o. white female seen in consultation at the request of Griffin Dakin, * for evaluation for candidacy for renal transplantation.     I spent 15 minutes with the patient obtaining the above history and physical examination, and greater than 50% of the time was spent on counseling and the substance of the discussion.    Today we discussed renal transplantation going over the surgery, the hospital course including length of stay, anti-rejection medications and their side effects, results and the cadaveric donor system and living donor options.    In regards to the surgical procedure, this is a major operation performed under general anesthesia with the risks of heart attack, stroke and death.  Multiple invasive means of monitoring may be necessary during the operation including an arterial line, a central venous catheter, a foley catheter inserted into the bladder, and a tube from your nose into your stomach to prevent stomach distension. After surgery, the patient usually goes to a monitored unit and is then sent to the regular ward. The incisional area is subject to the risk of infection and hernia and this risk is increased by obesity, diabetes and the immunosuppressive medications. Numbness can occur over the area of any surgical incision. The less common surgical complications (less than 5%) include blood clots in the legs or pelvis that may travel to the lungs, blood vessel infection or clotting, bleeding, urinary leaks, and lymphocele. Urgent/emergent re-operation may be required after a transplant for any of the above complications. One important point following surgery is that the hospital course can be prolonged, and dialysis may be necessary for some time, if the kidney does not function immediately (ATN). Less than 5% of the time, a transplanted kidney may never function or may function for only a short period of time.    Anti-rejection medications, including FK506 (Prograf) or Cyclosporine (Neoral), Cellcept, steroids and others, will be needed as long as the kidney is viable. Problems include infection, cancer, hirsutism, tremors, gum swelling, hypertension, bone fractures, aggravation of diabetes or new onset diabetes, cataracts, and rashes.    The donor system was reviewed. Although all donors are tested for all known infections, there is a persisting chance of transmission of viruses or other infections as well as possible transmission of tumors. We reviewed the advantages and disadvantages of living and deceased donor renal transplantation. We also reviewed deceased donation after cardiac death and extended criteria donors. We also discussed crossmatch tests and their inability to predict some delayed antibody-mediated rejection episodes.    Finally, I reviewed with the patient how the surgery is expected to improve their health and quality of life, that the average length of hospitalization stay is 3-5 days, and that the length of their expected recovery period, including when normal daily activities may be resumed, will be patient dependent.    [name]    had all questions answered and was urged to call us at any time if additional questions should arise.    This patient was seen and evaluated with  no contraindication for transplant from a surgical perspective.    Needs follow up Chest CT LDCT in 1 yr from 02/05/21    HPI:  [name] is a 60 y.o. white female who presents for evaluation for kidney transplant.     Cause of kidney disease: Alports, DM2  On dialysis?:NO  Prior transplants: NO  Prior blood product  transfusions: DENIES  Kidney stones: YES IN PAST  Frequent UTI: NO  History of heart disease:NO        Allergies  Sulfur-8, Glucophage [metformin], Codeine, and Morphine      Medications    Current Outpatient Medications   Medication Sig Dispense Refill   ??? aspirin (ECOTRIN) 81 MG tablet Take 81 mg by mouth daily.  ?? ??   ??? betamethasone dipropionate (DIPROLENE) 0.05 % ointment ?? ?? 4   ??? calcipotriene (DOVONEX) 0.005 % cream Apply topically Two (2) times a day. 60 g 2   ??? cholecalciferol, vitamin D3, (VITAMIN D3) 1,000 unit capsule Take 1,000 Units by mouth daily.  ?? ??   ??? clobetasoL (TEMOVATE) 0.05 % ointment Apply to your thick psoriasis areas twice daily until clear 60 g 5   ??? empagliflozin (JARDIANCE) 10 mg tablet Take 10 mg by mouth daily. ?? ??   ??? empty container Misc Use as directed to dispose of Humira pens. 1 each 2   ??? insulin ASPART (NOVOLOG) 100 unit/mL injection Inject under the skin Three (3) times a day before meals. 10 mL 12   ??? insulin glargine (LANTUS) 100 unit/mL injection Inject 80 Units under the skin nightly. ?? ??   ??? lisinopril (PRINIVIL,ZESTRIL) 10 MG tablet Take 10 mg by mouth daily. ?? ??   ??? methocarbamoL (ROBAXIN) 500 MG tablet Take 1 tablet (500 mg total) by mouth Three (3) times a day as needed for up to 30 doses. 30 tablet 0   ??? naproxen (NAPROSYN) 500 MG tablet Take 1 tablet (500 mg total) by mouth 2 (two) times a day with meals. 60 tablet 1   ??? pen needle, diabetic 32 gauge x 5/32 (4 mm) Ndle Inject as directed Three (3) times a day before meals. ?? ??   ??? pregabalin (LYRICA) 50 MG capsule Take 1 capsule by mouth 3 (three) times a day. ?? 0   ??? rosuvastatin (CRESTOR) 10 MG tablet Take by mouth. ?? ??   ??? sodium bicarbonate 650 mg tablet Take 1 tablet (650 mg total) by mouth Two (2) times a day. 180 tablet 3   ??? triamcinolone (KENALOG) 0.1 % ointment Apply topically Two (2) times a day. 453.6 g 1   ??? TRUEPLUS PEN NEEDLE 32 gauge x 5/32 Ndle ?? ?? 10   ??? ustekinumab (STELARA) 45 mg/0.5 mL Syrg syringe Inject the contents of 1 syringe (45mg ) under the skin once every 12 weeks. START at week 16 0.5 mL 4   ??? ustekinumab (STELARA) 45 mg/0.5 mL Syrg syringe Inject the contents of 1 syringe (45 mg) under the skin at 0 and 4 weeks, and THEN inject 1 syringe (45mg ) every 12 weeks thereafter 1 mL 1   ??? ustekinumab (STELARA) 90 mg/mL Syrg syringe Inject  the contents of 1 syringe (90mg ) under the skin at week 0 and 4 as loading doses. THEN inject 1 pen (90mg ) every 12 weeks thereafter. 2 mL 0   ??? ustekinumab (STELARA) 90 mg/mL Syrg syringe Inject the contents of 1 syringe (90mg ) under the skin every 12 weeks. START at week 16 for maintenance. 1 mL 4         Past Medical History  Diagnosis Date   ??? Alport syndrome ??   ??? Back pain ??   ??? Chronic kidney disease ??   ??? Dental abscess ??   ??? Diabetes mellitus (CMS-HCC) ??   ?? type 2, don't remember A1C, controlled with insulin injections; okay per  physician at last visit   ??? Hyperlipidemia ??   ??? Hypertension ??   ?? well controlled with meds   ??? Kidney stone ??   ?? no problems since 1995   ??? Psoriasis ??      ??        Past Surgical History  Ureteric stenting  Past Surgical History:   Procedure Laterality Date   ??? CATARACT EXTRACTION Right 08/29/2017   ??? COLONOSCOPY ?? ??   ??? cyst removal in breast ?? ??   ??? HYSTERECTOMY ?? ??   ??? NEPHROSTOMY ?? ??   ??? PR COLSC FLX W/RMVL OF TUMOR POLYP LESION SNARE TQ N/A 06/19/2018   ?? Procedure: COLONOSCOPY FLEX; W/REMOV TUMOR/LES BY SNARE;  Surgeon: Jarvis Morgan, MD;  Location: [facility];  Service: Gastroenterology   ??? PR XCAPSL CTRC RMVL INSJ IO LENS PROSTH W/O ECP Right 08/29/2017   ?? Procedure: EXTRACAPSULAR CATARACT REMOVAL W/INSERTION OF INTRAOCULAR LENS PROSTHESIS, MANUAL OR MECHANICAL TECHNIQUE Lens Choice:OD ZCB00 +19.50 ZA9003 +19.00/+18.00;  Surgeon: O'Rese Bonney Roussel, MD;  Location: [facility] OR [facility];  Service: Ophthalmology   ??? PR XCAPSL CTRC RMVL INSJ IO LENS PROSTH W/O ECP Left 09/19/2017   ?? Procedure: EXTRACAPSULAR CATARACT REMOVAL W/INSERTION OF INTRAOCULAR LENS PROSTHESIS, MANUAL OR MECHANICAL TECHNIQUE Lens Choice: ZCB00 +21.00; ZA9003 +20.00/+19.00;  Surgeon: O'Rese Bonney Roussel, MD;  Location: [facility] OR [facility];  Service: Ophthalmology       Family History  Problem Relation Age of Onset   ??? Kidney disease Mother ??   ??? Diabetes Mother ??   ??? Cataracts Mother ??   ??? Heart attack Father ??   ??? Cancer Paternal Grandmother ??   ??? Breast cancer Neg Hx ??   ??? Colon cancer Neg Hx ??   ??? Endometrial cancer Neg Hx ??   ??? Ovarian cancer Neg Hx ??   ??? Melanoma Neg Hx ??   ??? Basal cell carcinoma Neg Hx ??   ??? Squamous cell carcinoma Neg Hx            Social History:  Tobacco use: current smoker, 1 packs per day  Alcohol use: denies  Drug use: denies      Review of Systems  A 12 system review of systems was negative except as noted in HPI    PE: 77 KG BMI 27.4  HR 75 BP 120/69  General:  Lungs: clear to auscultation, percussion to the bases, and unlabored breathing   Heart: euvolemic, regular rate and rhythm, normal S1 and S2, no murmur  Abd: soft nontender  Pulses: palpable  Neuro: NAD    Test Results    Imaging: EXAM: CT ABDOMEN PELVIS WO CONTRAST  DATE: 02/05/2021 9:36 AM   IMPRESSION:  Bilateral common iliac arteries with mild atherosclerotic vascular calcification, right greater than left.  ??  Additional chronic and incidental findings as above    Echocardiogram W Colorflow Spectral Doppler: Patient Communication     Add Comments??  Seen         Study Result    Result Text   Patient Info  Name:     Avory  Deschepper  Age:     16 years  DOB:     15-Feb-1961  Gender:     Female  MRN:     [id number]  Accession #:     [id number]  Ht:     170 cm  Wt:     77 kg  BSA:  1.92 m2  Exam Date:     02/05/2021 11:12 AM  [facility]:     [facility]  Exam Location:     [facility]  Admit Date:     02/05/2021  ??  Exam Type:     ECHOCARDIOGRAM W COLORFLOW SPECTRAL DOPPLER  ??  Study Info  Indications       - pre kidney transplant  ??  Complete two-dimensional, color flow and Doppler transthoracic echocardiogram  is performed.  ??  Staff  Referring Physician:     Elwyn Lade RUDOLPH ;  Sonographer:     Roylene Reason, RDCS  Ordering Physician:     Elwyn Lade RUDOLPH  ??  Account #:     [id number]  ??  ??  Summary    1. The left ventricle is normal in size with normal wall thickness.    2. The left ventricular systolic function is normal, LVEF is visually  estimated at > 55%.    3. There is mild mitral valve regurgitation.    4. The right ventricle is normal in size, with normal systolic function

## 2021-03-04 NOTE — Unmapped (Signed)
Pt seen in surgery clinic today by Dr. Edwin Dada for her kidney transplant evaluation. I spent 10 minutes with pt reviewing the kidney transplant evaluation process and testing, KSC, and listing procedure, pre-op, transplant surgery, and post-op considerations.  Patient verbalizes and acknowledges understanding of education provided today.  Patient education checklist signed today.  Pt able to have questions and concerns addressed with transplant team today.     Smoker:yes vaping 10% nicotine product. been a smoker since age 33  Urine output: yes .  Living donor: No    HM: Already received: Colonoscopy, PPD results         Patient requested to provide:pap smear, mammogram  Pt has fallopian tube x1 only, partial hysterectomy about 67yrs ago.  She was told she doesn't need pap's anymore  Mammo was a couple years ago, pt wants at [facility]  Encouraged to quit smoking. Pt did not do PFT at this time.  No living donors

## 2021-03-18 NOTE — Unmapped (Signed)
Complex Case Management  SUMMARY NOTE    High Risk Care Coordinator  spoke with patient and verified correct patient using two identifiers today to introduce the Complex Case Management program.     Discussed the following:  Program Services, Expectations of participation and Verified Demographics    Program status: Declined  Patient states that she does not need the services of the Complex Case Management program as she is already receiving similar services. Care Coordinator has voiced understanding and will send our contact information to patient via her [place] My Chart shall she need to utilize our services in the future as patient has voiced understanding.       Deliah Goody - High Risk Care Coordinator   [facility]  [address] [place], [place] [address]  P: [phone] F: [phone]  [email or web address]

## 2021-03-19 ENCOUNTER — Ambulatory Visit: Admit: 2021-03-19 | Discharge: 2021-03-20 | Payer: PRIVATE HEALTH INSURANCE

## 2021-03-19 DIAGNOSIS — N2581 Secondary hyperparathyroidism of renal origin: Principal | ICD-10-CM

## 2021-03-19 DIAGNOSIS — Q8781 Alport syndrome: Principal | ICD-10-CM

## 2021-03-19 DIAGNOSIS — D631 Anemia in chronic kidney disease: Principal | ICD-10-CM

## 2021-03-19 DIAGNOSIS — N184 Chronic kidney disease, stage 4 (severe): Principal | ICD-10-CM

## 2021-03-19 LAB — RENAL FUNCTION PANEL
ALBUMIN: 3.9 g/dL (ref 3.4–5.0)
ANION GAP: 11 mmol/L (ref 5–14)
BLOOD UREA NITROGEN: 52 mg/dL — ABNORMAL HIGH (ref 9–23)
BUN / CREAT RATIO: 12
CALCIUM: 9.5 mg/dL (ref 8.7–10.4)
CHLORIDE: 108 mmol/L — ABNORMAL HIGH (ref 98–107)
CO2: 20 mmol/L (ref 20.0–31.0)
CREATININE: 4.46 mg/dL — ABNORMAL HIGH
EGFR CKD-EPI (2021) FEMALE: 11 mL/min/{1.73_m2} — ABNORMAL LOW (ref >=60)
GLUCOSE RANDOM: 195 mg/dL — ABNORMAL HIGH (ref 70–179)
PHOSPHORUS: 6.6 mg/dL — ABNORMAL HIGH (ref 2.4–5.1)
POTASSIUM: 5.9 mmol/L — ABNORMAL HIGH (ref 3.4–4.8)
SODIUM: 139 mmol/L (ref 135–145)

## 2021-03-19 LAB — HEMOGLOBIN A1C
ESTIMATED AVERAGE GLUCOSE: 146 mg/dL
HEMOGLOBIN A1C: 6.7 % — ABNORMAL HIGH (ref 4.8–5.6)

## 2021-03-19 LAB — PARATHYROID HORMONE (PTH): PARATHYROID HORMONE INTACT: 168.8 pg/mL — ABNORMAL HIGH (ref 18.4–80.1)

## 2021-03-19 NOTE — Unmapped (Signed)
Dermatology Note     Assessment and Plan:      Psoriasis,??5-10%??BSA involvement: significant improvement on Stelara  - Patient is tolerating well without side-effects.  She is aware of potential side effects including increased risk of infection  - s/p 2 loading doses of Stelara. Pt deferred refills of topicals today  - Previously tried [place] and Humira with minimal improvement and concern for increased risk of infection with the Humira.   - Continue clobetasol 0.05% ointment BID to thicker plaques until clear. Refilled today.  - Continue TAC 0.1% ointment BID to thinner plaques??  - Continue ustekinumab (STELARA) 45 mg/0.5 mL Syrg syringe; Inject the contents of 1 syring (45 mg) under the skin once every 12 weeks. Refilled today.   ??  High Risk Medication- Ustekinumab  - Quantiferon Gold negative Feb 2022  - Discussed extensively that stelara is an immunosuppressant, and given the possibility that the patient may have a kidney transplant soon, will message her kidney transplant doctor to discuss stopping Stelara at or just before the time of transplant.     Benign Lesions/ Findings/screening:   Angioma(s)  Lentigo/Lentigines  Seborrheic Keratosis(es) - no irritation noted  - Reassurance provided regarding the benign appearance of lesions noted on exam today; no treatment is indicated in the absence of symptoms/changes.  - Discussed that having a kidney transplant can significantly increase risk of skin cancer.  - Reinforced importance of photoprotective strategies including liberal and frequent sunscreen use of a broad-spectrum SPF 30 or greater, use of protective clothing, and sun avoidance for prevention of cutaneous malignancy and photoaging.  Counseled patient on the importance of regular self-skin monitoring as well as routine clinical skin examinations as scheduled.     The patient was advised to call for an appointment should any new, changing, or symptomatic lesions develop.       There are no dosing adjustments in renal failure for ustekinumab per manufacturer's labelling and there are precedents in the literature of use in renal failure. The patient feels she benefits from this medication and we will therefore continue.     Nimmannitya K, Dorien Chihuahua K, Yamada S, Goto H, Okada S, Tsuruta D. Successful treatment with ustekinumab of psoriasis vulgaris in a patient undergoing hemodialysis. J Dermatol. 2016 Jan;43(1):92-4. doi: 10.1111/1346-8138.12989. Epub 2015 Jun 24. PMID: 81829937.    Elinor Parkinson H. Ustekinumab treatment in patients with psoriasis undergoing hemodialysis. J Dermatol. 2015 Jul;42(7):731-4. doi: 10.1111/1346-8138.12903. Epub 2015 May 11. PMID: 16967893.    RTC: Return in about 6 months (around 09/20/2021) for follow up of psoriasis. or sooner as needed   _________________________________________________________________      Chief Complaint     Chief Complaint   Patient presents with   ??? Psoriasis     Follow up; patient reports no current flares; provided check in phone number to patient       HPI     Amy [name] is a 60 y.o. female who presents as a returning patient (last seen by Dr. Herbie Drape and Dr. Linton Rump on 09/15/2020) to [facility] for follow up of psoriasis. At last visit, patient was to continue clobetasol 0.05% ointment BID, continue triamcinolone 0.1% ointment BID, and continue stelara every 12 weeks for psoriasis.    Today, patient reports no current flare ups, and notes significant improvement with Humira. She does endorse slight roughness of the elbows but overall improvement. Patient is  still treating with the clobetasol but not very often and only as needed. She was not recommended any additional medication by her rheumatologist and discussed that she does not have psoriatic arthritis. Patient denies her nephrologist discussing stopping stelara with her.    The patient denies any other new or changing lesions or areas of concern.     Pertinent Past Medical History     No history of skin cancer  Psoriasis  Diabetes  HTN  Alport's disease  No history of skin cancer  Current smoker  ??  For history, she was first seen by [place] on 02/08/2017 and was initially started on clobetasol only. ??She was subsequently started on apremilast on 09/25/2017. ??She was started on Humira on 12/04/2018. ??She had a biopsy on 12/04/2018 which was suggestive of psoriasis. 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 ??She had a negative PPD test on 02/13/2019.????In addition to seeing me, she is also previously seen Dr. Carles Collet and Dr. Orma Flaming. ??A biopsy was done on 12/04/2018??which was consistent with psoriasis.    Family History:   Negative for melanoma     Allergies:  Sulfur  Metformin  Codeine  Morphine    Social History:  Lives in [place] [address]  Pt is a smoker    Past Medical History, Family History, Social History, Medication List, Allergies, and Problem List were reviewed in the rooming section of Epic.     ROS: Other than symptoms mentioned in the HPI, no fevers, chills, or other skin complaints    Physical Examination     GENERAL: Well-appearing female in no acute distress, resting comfortably.  NEURO: Alert and oriented, answers questions appropriately  PSYCH: Normal mood and affect  SKIN (Full Skin Exam): Examination of the face, eyelids, lips, nose, ears, neck, chest, abdomen, back, arms, legs, hands, feet, palms, soles, nails was performed, including scalp, including buttocks  - Angioma(s): Scattered red vascular papule(s) on the scattered diffusely  - Lentigo/lentigines: Scattered pigmented macules that are tan to brown in color and are somewhat non-uniform in shape and concentrated in the sun-exposed areas of the face and upper extremities  - Seborrheic Keratosis(es): Stuck-on appearing keratotic papule(s) on the scattered diffusely, none irritated with redness, crusting, edema, and/or partial avulsion  - Photo actinic change of the bilateral forearms  - Very thin psoriatic plaques on elbows, otherwise clear    All areas not commented on are within normal limits or unremarkable    Scribe's Attestation: Abbie Sons, MD obtained and performed the history, physical exam and medical decision making elements that were entered into the chart.  Signed by Kelle Darting, Scribe, on March 23, 2021 8:11 AM.    March 24, 2021 11:48 AM. Documentation assistance provided by the Scribe. I was present during the time the encounter was recorded. The information recorded by the Scribe was done at my direction and has been reviewed and validated by me.       (Approved Template 01/27/2020)

## 2021-03-19 NOTE — Unmapped (Addendum)
Continue to take stelara injections every 12 weeks. I will message your kidney transplant doctor to discuss if you would continue Stelara. Sent a refill of Stelara today.    Stelara is an immunosuppressant and can increase your risk of skin cancer, and so it is really important to have good sun protection. See the handout below.    Apply twice a day until clear:   Severe areas - clobetasol. Refilled today.   Thinner areas - TAC    Skin Cancer Prevention: Care Instructions  Your Care Instructions     Skin cancer is the abnormal growth of cells in the skin. It usually appears as a growth that changes in color, shape, or size. This can be a sore that does not heal or a change in a wart or a mole. Skin cancer is almost always curable when found early and treated. So it is important to see your doctor if you have any of these changes in your skin.  Skin cancer is the most common type of cancer. It often appears on areas of the body that have been exposed to the sun, such as the head, face, neck, back, chest, or shoulders.  Follow-up care is a key part of your treatment and safety. Be sure to make and go to all appointments, and call your doctor if you are having problems. It's also a good idea to know your test results and keep a list of the medicines you take.  How can you care for yourself at home?  Wear a wide-brimmed hat and long sleeves and pants if you are going to be outdoors for a long time.  Avoid the sun between 10 a.m. and 4 p.m., which is the peak time for UV rays.  Wear sunscreen on exposed skin. Make sure to use a broad-spectrum sunscreen that has a sun protection factor (SPF) of 30 or higher. Use it every day, even when it is cloudy.  Do not use tanning booths or sunlamps.  Use lip balm or cream that has sun protection factor (SPF) to protect your lips from getting sunburned.  Wear sunglasses that block UV rays.  When should you call for help?   Call your doctor now or seek immediate medical care if: You have signs of infection, such as:  Increased pain, swelling, warmth, or redness.  Red streaks leading from the area.  Pus draining from the area.  A fever.   Watch closely for changes in your health, and be sure to contact your doctor if:    You see a change in your skin, such as a growth or mole that:  Grows bigger. This may happen very slowly.  Changes color.  Changes shape.  Starts to bleed easily.     You have swollen glands in your armpits, groin, or neck.     You do not get better as expected.   Where can you learn more?  Go to [facility] at https://[facility]chart.Armed forces logistics/support/administrative officer in the Menu. Enter P392 in the search box to learn more about Skin Cancer Prevention: Care Instructions.  Current as of: January 22, 2020               Content Version: 13.3  ?? 2006-2022 [employer].   Care instructions adapted under license by [facility]. If you have questions about a medical condition or this instruction, always ask your healthcare professional. [employer] disclaims any warranty or liability for your use of this information.

## 2021-03-23 ENCOUNTER — Ambulatory Visit: Admit: 2021-03-23 | Discharge: 2021-03-24 | Payer: PRIVATE HEALTH INSURANCE

## 2021-03-23 DIAGNOSIS — L409 Psoriasis, unspecified: Principal | ICD-10-CM

## 2021-03-23 MED ORDER — CLOBETASOL 0.05 % TOPICAL OINTMENT
OPHTHALMIC | 5 refills | 0.00000 days | Status: CP
Start: 2021-03-23 — End: ?

## 2021-03-23 MED ORDER — USTEKINUMAB 45 MG/0.5 ML SUBCUTANEOUS SYRINGE
SUBCUTANEOUS | 4 refills | 0.00000 days | Status: CP
Start: 2021-03-23 — End: ?
  Filled 2021-04-15: qty 0.5, 84d supply, fill #0

## 2021-04-02 NOTE — Unmapped (Signed)
Patient/Caregiver called into to the main line of the [facility].   Care Manager transferred phone to CAMP Rose Fillers, Coordinator.

## 2021-04-13 NOTE — Unmapped (Signed)
The [facility] has made a second and final attempt to reach this patient to refill the following medication:Stelara.      We have left voicemails on the following phone numbers: [phone].    Dates contacted: 11/17 and 11/29  Last scheduled delivery: 9/8    The patient may be at risk of non-compliance with this medication. The patient should call the [facility] at [phone]  Option 4, then Option 2 (all other specialty patients) to refill medication.    Zhaniya Swallows D Administrator Shared [facility] Specialty Technician

## 2021-04-14 ENCOUNTER — Ambulatory Visit: Admit: 2021-04-14 | Discharge: 2021-04-15 | Payer: PRIVATE HEALTH INSURANCE

## 2021-04-14 DIAGNOSIS — D631 Anemia in chronic kidney disease: Principal | ICD-10-CM

## 2021-04-14 DIAGNOSIS — N184 Chronic kidney disease, stage 4 (severe): Principal | ICD-10-CM

## 2021-04-14 DIAGNOSIS — E559 Vitamin D deficiency, unspecified: Principal | ICD-10-CM

## 2021-04-14 DIAGNOSIS — N2581 Secondary hyperparathyroidism of renal origin: Principal | ICD-10-CM

## 2021-04-14 DIAGNOSIS — Q8781 Alport syndrome: Principal | ICD-10-CM

## 2021-04-14 NOTE — Unmapped (Signed)
[facility] Refill Coordination Note    Specialty Medication(s) to be Shipped:   Inflammatory Disorders: Stelara    Other medication(s) to be shipped: No additional medications requested for fill at this time     Amy [name], DOB: 01/21/1961  Phone: [phone] (home)       All above HIPAA information was verified with patient.     Was a Nurse, learning disability used for this call? No    Completed refill call assessment today to schedule patient's medication shipment from the [facility] [phone]).  All relevant notes have been reviewed.     Specialty medication(s) and dose(s) confirmed: Regimen is correct and unchanged.   Changes to medications: Saraih reports no changes at this time.  Changes to insurance: No  New side effects reported not previously addressed with a pharmacist or physician: None reported  Questions for the pharmacist: No    Confirmed patient received a Conservation officer, historic buildings and a Surveyor, mining with first shipment. The patient will receive a drug information handout for each medication shipped and additional FDA Medication Guides as required.       DISEASE/MEDICATION-SPECIFIC INFORMATION        For patients on injectable medications: Patient currently has 0 doses left.  Next injection is scheduled for 04/15/2021.    SPECIALTY MEDICATION ADHERENCE     Medication Adherence    Patient reported X missed doses in the last month: 0  Specialty Medication: STELARA 45 mg/0.5 mL  Patient is on additional specialty medications: No  Any gaps in refill history greater than 2 weeks in the last 3 months: no  Demonstrates understanding of importance of adherence: yes  Informant: patient  Reliability of informant: reliable  Confirmed plan for next specialty medication refill: delivery by pharmacy  Refills needed for supportive medications: not needed              Were doses missed due to medication being on hold? No    Stelara 45/0.5 mg/ml: 0 days of medicine on hand       REFERRAL TO PHARMACIST     Referral to the pharmacist: Not needed      [facility]     Shipping address confirmed in Epic.     Delivery Scheduled: Yes, Expected medication delivery date: 04/15/2021.     Medication will be delivered via Same Day Courier to the prescription address in Epic WAM.    Azreal Stthomas D Estefan Pattison   [facility] Shared [facility] Specialty Technician

## 2021-04-14 NOTE — Unmapped (Signed)
PCP:  Amy Morgan, MD    04/14/2021  [facility] [address] [place]  [address]  [place] [address]  Dept: [phone]  Loc: [phone]    Chief Complaint: Alport syndrome, elevated creatinine    HPI:  [name] is a 60 y.o. 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 Her mother also had renal disease but it is unknown if this was due to Alports.     She has uncontrolled type 2 diabetes and is not checking her glucose regularly - her A1c was 8.4% in September 2020 and most recently was 9.9% (06/09/2020). She does have a history of diabetic neuropathy but no known history of diabetic retinopathy. Recently started on Jardiance.     She did suffer from kidney stones in 1998 however has not had any issues with this since.    She was referred to [facility] for transplant but was denied due to financial concerns. They recommended we refer her to [facility]. She met with them since our LOV. She went through the orientation class and is progressing.     She has also spoken with [facility].     She feels ok today, but does note some back pain that radiates to her left leg. She feels this is a pinched nerve. She knows to avoid NSAIDS.     She is interested in [facility] as an option for dialysis      ROS:   CONSTITUTIONAL: denies fevers or chills, denies unintentional weight loss   CARDIOVASCULAR: denies chest pain, denies dyspnea on exertion, denies leg edema  GASTROINTESTINAL: denies nausea, denies vomiting, denies anorexia  GENITOURINARY: denies dysuria, denies hematuria, denies decreased urinary stream  All 10 systems reviewed and are negative except as listed above.    PAST MEDICAL HISTORY:  Past Medical History:   Diagnosis Date   ??? Alport syndrome    ??? Back pain    ??? Chronic kidney disease    ??? Dental abscess    ??? Diabetes mellitus (CMS-HCC)     type 2, don't remember A1C, controlled with insulin injections; okay per physician at last visit   ??? Hyperlipidemia ??? Hypertension     well controlled with meds   ??? Kidney stone     no problems since 1995   ??? Psoriasis        ALLERGIES  Sulfur-8, Glucophage [metformin], Codeine, and Morphine    FAMILY HISTORY  Family History   Problem Relation Age of Onset   ??? Kidney disease Mother    ??? Diabetes Mother    ??? Cataracts Mother    ??? Heart attack Father    ??? Cancer Paternal Grandmother    ??? Breast cancer Neg Hx    ??? Colon cancer Neg Hx    ??? Endometrial cancer Neg Hx    ??? Ovarian cancer Neg Hx    ??? Melanoma Neg Hx    ??? Basal cell carcinoma Neg Hx    ??? Squamous cell carcinoma Neg Hx        SOCIAL HISTORY   reports that she quit smoking about 2 months ago. Her smoking use included cigarettes. She has a 43.00 pack-year smoking history. She has never used smokeless tobacco. She reports that she does not drink alcohol and does not use drugs.    MEDICATIONS:  Current Outpatient Medications   Medication Sig Dispense Refill   ??? aspirin (ECOTRIN) 81 MG tablet Take 81 mg by  mouth daily.      ??? betamethasone dipropionate (DIPROLENE) 0.05 % ointment   4   ??? calcipotriene (DOVONEX) 0.005 % cream Apply topically Two (2) times a day. 60 g 2   ??? cholecalciferol, vitamin D3-25 mcg, 1,000 unit,, 25 mcg (1,000 unit) capsule Take 1,000 Units by mouth daily.      ??? clobetasoL (TEMOVATE) 0.05 % ointment Apply to your thick psoriasis areas twice daily until clear 60 g 5   ??? empagliflozin (JARDIANCE) 10 mg tablet Take 10 mg by mouth daily.     ??? empty container Misc Use as directed to dispose of Humira pens. 1 each 2   ??? LANTUS SOLOSTAR U-100 INSULIN 100 unit/mL (3 mL) injection pen      ??? lisinopril (PRINIVIL,ZESTRIL) 10 MG tablet Take 10 mg by mouth daily.     ??? methocarbamoL (ROBAXIN) 500 MG tablet Take 1 tablet (500 mg total) by mouth Three (3) times a day as needed for up to 30 doses. 30 tablet 0   ??? NOVOLOG FLEXPEN U-100 INSULIN 100 unit/mL (3 mL) injection pen      ??? pen needle, diabetic 32 gauge x 5/32 (4 mm) Ndle Inject as directed Three (3) times a day before meals.     ??? pregabalin (LYRICA) 25 MG capsule      ??? pregabalin (LYRICA) 50 MG capsule Take 1 capsule by mouth in the morning and 1 capsule at noon and 1 capsule in the evening.  0   ??? rosuvastatin (CRESTOR) 10 MG tablet Take by mouth.     ??? sodium bicarbonate 650 mg tablet Take 2 tablets (1,300 mg total) by mouth Two (2) times a day. 360 tablet 3   ??? triamcinolone (KENALOG) 0.1 % ointment Apply topically Two (2) times a day. 453.6 g 1   ??? TRUEPLUS PEN NEEDLE 32 gauge x 5/32 Ndle   10   ??? ustekinumab (STELARA) 45 mg/0.5 mL Syrg syringe Inject the contents of 1 syringe (45 mg) under the skin once every 12 weeks. 0.5 mL 4     No current facility-administered medications for this visit.       PHYSICAL EXAM:  Wt Readings from Last 3 Encounters:   04/14/21 77.2 kg (170 lb 1.6 oz)   03/04/21 77.1 kg (169 lb 14.4 oz)   02/25/21 76.7 kg (169 lb 3.2 oz)     Temp Readings from Last 3 Encounters:   04/14/21 36.3 ??C (97.4 ??F) (Temporal)   03/04/21 35.7 ??C (96.3 ??F) (Tympanic)   02/25/21 36 ??C (96.8 ??F) (Temporal)     BP Readings from Last 5 Encounters:   04/14/21 126/66   03/04/21 125/69   03/04/21 120/69   02/25/21 120/68   12/16/20 109/65     Pulse Readings from Last 3 Encounters:   04/14/21 79   03/04/21 79   03/04/21 75       CONSTITUTIONAL: Alert,well appearing, no distress  HEENT: Moist mucous membranes, oropharynx clear without erythema or exudate  EYES: Extra ocular movements intact. Pupils reactive, sclerae anicteric.  NECK: Supple, no lymphadenopathy  CARDIOVASCULAR: Regular, normal S1/S2 heart sounds, no murmurs, no rubs.   PULM: Clear to auscultation bilaterally  GASTROINTESTINAL: Soft, active bowel sounds, nontender  EXTREMITIES: No lower extremity edema bilaterally.   SKIN: No rashes or lesions  NEUROLOGIC: No focal motor or sensory deficits    MEDICAL DECISION MAKING    06/09/2020  Hgb 13.2, hct 38.7  Glu 192, BUN 50, Creatinine 2.93, eGFR 17,  Na 139, K 4.8, CO2 18, Ca 9.5  A1c 9.9%      01/24/2018  HbA1c 8.4%  Ferritin 120  B12 981, folic acid 11.8  Hgb 12.9, HCT 38.8  Glucose 95, BUN 46, CR 2.51, EGFR 21  NA 143, K5.0, CL 109, CO2 17, CA 9.7, ALB 4.1    03/28/17  HbA1c 8.2%, cholesterol 275, triglyceride 471  GLU 187, BUN 37, CR 2.12, EGFR 26, NA 139, K5.0, CL 105, CO2 19, CA 9.2    03/03/09  sodium 140, potassium 5.1, chloride 109, BUN 34, creatinine 1.82 with eGFR 30, glucose 99, albumin 4.2  Total cholesterol 251, triglycerides 340 and LDL 146.  WBC 8.3, hemoglobin 14.3, hematocrit 43.9 and platelets 181,000.  Hemoglobin A1c 6.9%.    Renal U/S showed right kidney 9.4cm and left 9.9cm.    Two simple cysts in the left kidney in the mid and lower poles measuring  1.3 and 0.9cm in diameter.     Creatinine   Date Value Ref Range Status   03/19/2021 4.46 (H) 0.60 - 0.80 mg/dL Final   16/02/9603 5.40 (H) 0.60 - 0.80 mg/dL Final   98/03/9146 8.29 (H) 0.60 - 0.80 mg/dL Final   56/21/3086 5.78 (H) 0.57 - 1.00 mg/dL Final   46/96/2952 8.41 (H) 0.60 - 1.00 mg/dL Final   32/44/0102 7.25 (H) 0.60 - 1.00 mg/dL Final   36/64/4034 7.42 (H) 0.60 - 1.00 mg/dL Final        Lab Results   Component Value Date    NA 139 03/19/2021    K 5.9 (H) 03/19/2021    CL 108 (H) 03/19/2021    CO2 20.0 03/19/2021    BUN 52 (H) 03/19/2021    CREATININE 4.46 (H) 03/19/2021    GFRAA 20 (L) 01/18/2019    GFRNONAA 17 (L) 01/18/2019    GLU 195 (H) 03/19/2021    CALCIUM 9.5 03/19/2021    ALBUMIN 3.9 03/19/2021    PHOS 6.6 (H) 03/19/2021       Lab Results   Component Value Date    PTH 168.8 (H) 03/19/2021    CALCIUM 9.5 03/19/2021       Lab Results   Component Value Date    BILITOT 0.3 08/27/2020    BILIDIR <0.10 08/27/2020    PROT 7.4 08/27/2020    ALBUMIN 3.9 03/19/2021    ALT 17 08/27/2020    AST 19 08/27/2020    ALKPHOS 113 08/27/2020    GGT 27 08/27/2020     Lab Results   Component Value Date    INR 0.91 08/27/2020    APTT 30.7 08/27/2020       HGB   Date Value Ref Range Status   08/27/2020 13.3 11.3 - 14.9 g/dL Final 59/56/3875 64.3 32.9 - 15.9 g/dL Final   51/88/4166 06.3 12.0 - 16.0 g/dL Final   01/60/1093 23.5 12.0 - 16.0 g/dL Final        Lab Results   Component Value Date    WBC 7.7 08/27/2020    HGB 13.3 08/27/2020    HCT 39.4 08/27/2020    PLT 154 08/27/2020     No results found for: IRON, TIBC, FERRITIN    Lab Results   Component Value Date    A1C 6.7 (H) 03/19/2021    A1C 8.6 (H) 08/27/2020       Lab Results   Component Value Date    Color, UA Yellow 02/23/2020    Protein, Ur 98.4 12/11/2020  Glucose, UA >1000 mg/dL (A) 41/32/4401    Ketones, UA Negative 02/23/2020    Blood, UA Small (A) 02/23/2020    Nitrite, UA Negative 02/23/2020   ]    Lab Results   Component Value Date    PROTEINUR 98.4 12/11/2020    CREATUR 80.0 12/11/2020     Lab Results   Component Value Date    PCRATIOUR 1.230 12/11/2020       Lab Results   Component Value Date    CHOL 191 08/27/2020    A1C 6.7 (H) 03/19/2021    HEPAIGG Reactive (A) 08/27/2020    HEPBCAB Nonreactive 08/27/2020    HEPCAB Nonreactive 08/27/2020         03/03/09, sodium 140, potassium 5.1, chloride  109, BUN 34, creatinine 1.82 with eGFR 30, glucose 99 and albumin  4.2.  ??  Total cholesterol 251, triglycerides 340 and LDL 146.  ??  WBC 8.3, hemoglobin 14.3, hematocrit 43.9 and platelets 181,000.      04/29/08  BUN 44, creatinine 1.86.   Sodium 141, potassium 4.9, calcium 9.4, phosphorus 4.1, albumin  4.0, AST 17, ALT 14, total cholesterol 231, triglycerides 297,  HDL 37, LDL 135.  Hemoglobin 13.8, TSH 1.48.  A1c was 7.5.    12/24/2007   glucose of 131, BUN of 34,   creatinine of 1.79 with an estimated GFR 31, potassium of 4.9, bicarbonate  of 19, calcium of 9.4, phosphorous of 3.9, albumin of 4.0, and hemoglobin  A1c of 7.3.  ??  A renal ultrasound done on 10/10/2007 showed mildly increased echotexture  of bilateral renal cortices, with the right kidney measuring 9.4  x 4.0 x 4.7 cm, and the left kidney measuring 9.4 x 4.2 x 4.2 cm,  with no hydronephrosis.  There are simple-appearing cysts in the  mid to lower pole of the left kidney.        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    Comment:  There is no evidence of a glomerulonephritis. ??Electron microscopy shows marked  thinning of the lamina densa along glomerular basement membranes associated with  an abnormal immunofluorescence staining pattern for collagen type 4 alpha 5 and  to a lesser degree of 3 subunits (mosaic staining). 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 ??The nephropathy has resulted in thinning of peripheral glomerular  capillary walls and glomerular as well tubulointerstitial fibrosis.    ASSESSMENT/PLAN:    Ms.Amy [name] is a 60 y.o. patient with a past medical history significant for DM2, Alport Being seen for reevaluation of her renal disease.     1. 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 Her creatinine is worse than before and she may need to start dialysis soon. She is aware of this and in agreement. Recheck in 6 weeks.     2. CKD stage 5 -  I do recommend continuing the lisinopril and emphasizing the importance of hypertension and glucose control. Her renal biopsy did have a degree of interstitial fibrosis and glomerular sclerosis which are likely indicative of damage from these other conditions as well. The thinning of the basement membrane is likely due to the Alport syndrome.    - Her renal function is worse than before.     - We discussed transplant today. She  is unhappy that [facility] rejected her due to concerns about her finances. She was told she would need ~$7000 to undergo work up. She has been referred to [facility] and underwent orientation class. She is a little overwhelmed by the amount of testing involved.     - We briefly discussed dialysis. She would be a good home therapy candidate. There is no indication for dialysis at this time, she is not uremic. We will send her to meet with the dialysis educator. Referral re-sent.     3. DM2 - Defer to PCP for management. Metformin contraindicated for EGFR less than 30.    4. Hypertension - currently, this is well-controlled with lisinopril. At this time, I do not recommend any changes to her medication regimen that I would continue her on the ACE inhibitor for renal protection as long as feasible. She is not displaying any signs of hypotension.     5. Acidosis - on NaBicarb 650mg  BID. Increase to 2 tabs BID    6. HyperPTH - continue OTC vitamin D     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 Consider adding binder at next OV if no improvement    Ms.Amy [name] will follow up in 6 weeks with a RFP, Hgb       Disclaimer: Much of the narrative of this dictation was acquired using speech recognition software. It is possible that some dictated speech was not transcribed accurately by this system nor detected in the proofing. Such errors are at times unavoidable.

## 2021-05-24 NOTE — Unmapped (Signed)
Pharmacy Pre-transplant Evaluation/ Selection Committee Note    Evaluation for kidney transplant    Patient:  Amy [name], Age:  61 y.o. old, Gender:  female    Allergies:  Sulfur-8, Glucophage [metformin], Codeine, and Morphine    Immunization history:    Immunization History   Administered Date(s) Administered   ??? COVID-19 VACC,MRNA,(PFIZER)(PF) 06/14/2019, 07/05/2019, 03/11/2020   ??? DTaP, Unspecified Formulation 07/28/1967, 09/26/1967, 12/11/1967, 12/26/1968, 07/26/1978   ??? Hep A / Hep B 03/14/2006, 04/12/2006, 07/24/2008   ??? INFLUENZA TIV (TRI) PF (IM) 05/06/2010   ??? Influenza Vaccine Quad (IIV4 PF) 88mo+ injectable 03/28/2018   ??? Influenza Virus Vaccine, unspecified formulation 05/06/2010, 04/16/2012   ??? MMR 07/26/1978   ??? Polio Virus Vaccine, Unspecified Formulation 07/28/1967, 09/26/1967, 12/12/1967, 12/26/1968   ??? TdaP 03/14/2006   ??? Tetanus and diptheria, adsorbed,(adult), 5Lf tetanus toxoid,PF 03/28/2018   ??? Tetanus-Diptheria Toxoids-TD(TDVAX),Asdorbed,2LF(IM) 04/14/1992, 03/28/2018       Alcohol, tobacco, illicit drug use history:  Social History     Substance and Sexual Activity   Alcohol Use No     Social History     Tobacco Use   Smoking Status Former   ??? Packs/day: 1.00   ??? Years: 43.00   ??? Pack years: 43.00   ??? Types: Cigarettes   ??? Quit date: 02/12/2021   ??? Years since quitting: 0.2   Smokeless Tobacco Never     Social History     Substance and Sexual Activity   Drug Use No       Home medications:  Current Outpatient Medications   Medication Instructions   ??? aspirin (ECOTRIN) 81 mg, Oral, Daily (standard)   ??? betamethasone dipropionate (DIPROLENE) 0.05 % ointment No dose, route, or frequency recorded.   ??? calcipotriene (DOVONEX) 0.005 % cream Topical, 2 times a day (standard)   ??? cholecalciferol (vitamin D3-25 mcg (1,000 unit)) 1,000 Units, Oral, Daily (standard)   ??? clobetasoL (TEMOVATE) 0.05 % ointment Apply to your thick psoriasis areas twice daily until clear   ??? empagliflozin (JARDIANCE) 10 mg, Oral, Daily (standard)   ??? empty container Misc Use as directed to dispose of Humira pens.   ??? LANTUS SOLOSTAR U-100 INSULIN 100 unit/mL (3 mL) injection pen No dose, route, or frequency recorded.   ??? lisinopriL (PRINIVIL,ZESTRIL) 10 mg, Oral, Daily (standard)   ??? methocarbamoL (ROBAXIN) 500 mg, Oral, 3 times a day PRN   ??? NOVOLOG FLEXPEN U-100 INSULIN 100 unit/mL (3 mL) injection pen No dose, route, or frequency recorded.   ??? pen needle, diabetic 32 gauge x 5/32 (4 mm) Ndle Injection, 3 times a day [facility])   ??? pregabalin (LYRICA) 25 MG capsule No dose, route, or frequency recorded.   ??? pregabalin (LYRICA) 50 MG capsule 1 capsule, Oral, 3 times daily (RT)   ??? rosuvastatin (CRESTOR) 10 MG tablet Oral   ??? sodium bicarbonate 1,300 mg, Oral, 2 times a day (standard)   ??? triamcinolone (KENALOG) 0.1 % ointment Topical, 2 times a day (standard)   ??? TRUEPLUS PEN NEEDLE 32 gauge x 5/32 Ndle No dose, route, or frequency recorded.   ??? ustekinumab (STELARA) 45 mg/0.5 mL Syrg syringe Inject the contents of 1 syringe (45 mg) under the skin once every 12 weeks.       Potential pharmacotherapy related concerns for transplantation:  1. anticoagulation concerns: N/A  2. cytochrome P-450 isoenzyme-mediated drug interactions:   none  3. other drug-interactions with medications post-transplant:  none  4. mental health-related medication(s):   none  5. chronic pain-related medication use:  none  6. use of hormonal contraception and replacement therapy:  none  7. prior or current use of immunosuppressants:none    8. issues with drug absorption: none   9. use of herbal supplements:  none to transplant team's knowledge    Non-adherence concerns post-transplantation based on pre-transplant evaluation assessment:  no     Recommendations:  1. No pharmacological concerns for transplantation      Spent 10 minutes completing medication review and addressing any pharmacological concerns with members of the multidisciplinary transplant team.    Thank you,  Olivia Mackie, PharmD

## 2021-05-24 NOTE — Unmapped (Signed)
Kidney Transplant Nutrition Evaluation    Chart reviewed and there are no nutrition barriers for transplant at this time. BMI of 27.45 (04/14/21) and HgbA1c of 6.7% (03/19/21)  are acceptable for transplant.     Will continue to follow per protocol or when consulted.     Greta Doom, MS RD - Clinical Dietitian II  Outpatient Nutrition Services   [facility]   [address], [place], [place] [address]  p [phone]- f [phone]  [email or web address]

## 2021-05-25 ENCOUNTER — Ambulatory Visit: Admit: 2021-05-25 | Discharge: 2021-05-26 | Payer: PRIVATE HEALTH INSURANCE

## 2021-05-25 DIAGNOSIS — N2581 Secondary hyperparathyroidism of renal origin: Principal | ICD-10-CM

## 2021-05-25 DIAGNOSIS — D631 Anemia in chronic kidney disease: Principal | ICD-10-CM

## 2021-05-25 DIAGNOSIS — Q8781 Alport syndrome: Principal | ICD-10-CM

## 2021-05-25 DIAGNOSIS — N184 Chronic kidney disease, stage 4 (severe): Principal | ICD-10-CM

## 2021-05-25 LAB — RENAL FUNCTION PANEL
ALBUMIN: 3.9 g/dL (ref 3.4–5.0)
ANION GAP: 10 mmol/L (ref 5–14)
BLOOD UREA NITROGEN: 53 mg/dL — ABNORMAL HIGH (ref 9–23)
BUN / CREAT RATIO: 13
CALCIUM: 9.7 mg/dL (ref 8.7–10.4)
CHLORIDE: 110 mmol/L — ABNORMAL HIGH (ref 98–107)
CO2: 22 mmol/L (ref 20.0–31.0)
CREATININE: 4.02 mg/dL — ABNORMAL HIGH
EGFR CKD-EPI (2021) FEMALE: 12 mL/min/{1.73_m2} — ABNORMAL LOW (ref >=60)
GLUCOSE RANDOM: 113 mg/dL (ref 70–179)
PHOSPHORUS: 5.1 mg/dL (ref 2.4–5.1)
POTASSIUM: 5.5 mmol/L — ABNORMAL HIGH (ref 3.4–4.8)
SODIUM: 142 mmol/L (ref 135–145)

## 2021-05-25 LAB — HEMOGLOBIN: HEMOGLOBIN: 12.6 g/dL (ref 11.3–14.9)

## 2021-05-25 NOTE — Unmapped (Signed)
PCP:  Emogene Morgan, MD    05/26/2021  [address] [address] [place]  [address]  [place] [address]  Dept: [phone]  Loc: [phone]    Chief Complaint: Alport syndrome, elevated creatinine    HPI:  [name] is a 61 y.o. 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 Her mother also had renal disease but it is unknown if this was due to Alports.     She has a history of uncontrolled type 2 diabetes and is not checking her glucose regularly - her A1c was 8.4% in September 2020 and most recently was 9.9% (06/09/2020). She does have a history of diabetic neuropathy but no known history of diabetic retinopathy. Recently started on Jardiance.     She did suffer from kidney stones in 1998 however has not had any issues with this since.    She was referred to [facility] for transplant but was denied due to financial concerns. They recommended we refer her to [facility]. She met with them since our LOV. She went through the orientation class and has been approved as of 05/24/2020    She has also spoken with [facility].     She feels ok today, but does note some back pain that radiates to her left leg. She feels this is a pinched nerve. She knows to avoid NSAIDS.     She is interested in PD as an option for dialysis. She has met with the dialysis educator - she feels like it could have been explained a little better.       ROS:   CONSTITUTIONAL: denies fevers or chills, denies unintentional weight loss   CARDIOVASCULAR: denies chest pain, denies dyspnea on exertion, denies leg edema  GASTROINTESTINAL: denies nausea, denies vomiting, denies anorexia  GENITOURINARY: denies dysuria, denies hematuria, denies decreased urinary stream  All 10 systems reviewed and are negative except as listed above.    PAST MEDICAL HISTORY:  Past Medical History:   Diagnosis Date   ??? Alport syndrome    ??? Back pain    ??? Chronic kidney disease    ??? Dental abscess    ??? Diabetes mellitus (CMS-HCC)     type 2, don't remember A1C, controlled with insulin injections; okay per physician at last visit   ??? Hyperlipidemia    ??? Hypertension     well controlled with meds   ??? Kidney stone     no problems since 1995   ??? Psoriasis        ALLERGIES  Sulfur-8, Glucophage [metformin], Codeine, and Morphine    FAMILY HISTORY  Family History   Problem Relation Age of Onset   ??? Kidney disease Mother    ??? Diabetes Mother    ??? Cataracts Mother    ??? Heart attack Father    ??? Cancer Paternal Grandmother    ??? Breast cancer Neg Hx    ??? Colon cancer Neg Hx    ??? Endometrial cancer Neg Hx    ??? Ovarian cancer Neg Hx    ??? Melanoma Neg Hx    ??? Basal cell carcinoma Neg Hx    ??? Squamous cell carcinoma Neg Hx        SOCIAL HISTORY   reports that she quit smoking about 3 months ago. Her smoking use included cigarettes. She has a 43.00 pack-year smoking history. She has never used smokeless tobacco. She reports that she does not drink alcohol and does  not use drugs.    MEDICATIONS:  Current Outpatient Medications   Medication Sig Dispense Refill   ??? aspirin (ECOTRIN) 81 MG tablet Take 81 mg by mouth daily.      ??? betamethasone dipropionate (DIPROLENE) 0.05 % ointment   4   ??? calcipotriene (DOVONEX) 0.005 % cream Apply topically Two (2) times a day. 60 g 2   ??? cholecalciferol, vitamin D3-25 mcg, 1,000 unit,, 25 mcg (1,000 unit) capsule Take 1,000 Units by mouth daily.      ??? clobetasoL (TEMOVATE) 0.05 % ointment Apply to your thick psoriasis areas twice daily until clear 60 g 5   ??? empagliflozin (JARDIANCE) 10 mg tablet Take 10 mg by mouth daily.     ??? empty container Misc Use as directed to dispose of Humira pens. 1 each 2   ??? LANTUS SOLOSTAR U-100 INSULIN 100 unit/mL (3 mL) injection pen      ??? lisinopril (PRINIVIL,ZESTRIL) 10 MG tablet Take 10 mg by mouth daily.     ??? methocarbamoL (ROBAXIN) 500 MG tablet Take 1 tablet (500 mg total) by mouth Three (3) times a day as needed for up to 30 doses. 30 tablet 0   ??? NOVOLOG FLEXPEN U-100 INSULIN 100 unit/mL (3 mL) injection pen      ??? pen needle, diabetic 32 gauge x 5/32 (4 mm) Ndle Inject as directed Three (3) times a day before meals.     ??? pregabalin (LYRICA) 25 MG capsule      ??? pregabalin (LYRICA) 50 MG capsule Take 1 capsule by mouth in the morning and 1 capsule at noon and 1 capsule in the evening.  0   ??? rosuvastatin (CRESTOR) 10 MG tablet Take by mouth.     ??? sodium bicarbonate 650 mg tablet Take 2 tablets (1,300 mg total) by mouth Two (2) times a day. 360 tablet 3   ??? triamcinolone (KENALOG) 0.1 % ointment Apply topically Two (2) times a day. 453.6 g 1   ??? TRUEPLUS PEN NEEDLE 32 gauge x 5/32 Ndle   10   ??? ustekinumab (STELARA) 45 mg/0.5 mL Syrg syringe Inject the contents of 1 syringe (45 mg) under the skin once every 12 weeks. 0.5 mL 4     No current facility-administered medications for this visit.       PHYSICAL EXAM:  Wt Readings from Last 3 Encounters:   05/26/21 76.9 kg (169 lb 8 oz)   04/14/21 77.2 kg (170 lb 1.6 oz)   03/04/21 77.1 kg (169 lb 14.4 oz)     Temp Readings from Last 3 Encounters:   05/26/21 36.4 ??C (97.5 ??F) (Temporal)   04/14/21 36.3 ??C (97.4 ??F) (Temporal)   03/04/21 35.7 ??C (96.3 ??F) (Tympanic)     BP Readings from Last 5 Encounters:   04/14/21 126/66   03/04/21 125/69   03/04/21 120/69   02/25/21 120/68   12/16/20 109/65     Pulse Readings from Last 3 Encounters:   04/14/21 79   03/04/21 79   03/04/21 75       CONSTITUTIONAL: Alert,well appearing, no distress  HEENT: Moist mucous membranes, oropharynx clear without erythema or exudate  EYES: Extra ocular movements intact. Pupils reactive, sclerae anicteric.  NECK: Supple, no lymphadenopathy  CARDIOVASCULAR: Regular, normal S1/S2 heart sounds, no murmurs, no rubs.   PULM: Clear to auscultation bilaterally  GASTROINTESTINAL: Soft, active bowel sounds, nontender  EXTREMITIES: No lower extremity edema bilaterally.   SKIN: No rashes or lesions  NEUROLOGIC: No  focal motor or sensory deficits    MEDICAL DECISION MAKING    06/09/2020  Hgb 13.2, hct 38.7  Glu 192, BUN 50, Creatinine 2.93, eGFR 17, Na 139, K 4.8, CO2 18, Ca 9.5  A1c 9.9%      01/24/2018  HbA1c 8.4%  Ferritin 120  B12 981, folic acid 11.8  Hgb 12.9, HCT 38.8  Glucose 95, BUN 46, CR 2.51, EGFR 21  NA 143, K5.0, CL 109, CO2 17, CA 9.7, ALB 4.1    03/28/17  HbA1c 8.2%, cholesterol 275, triglyceride 471  GLU 187, BUN 37, CR 2.12, EGFR 26, NA 139, K5.0, CL 105, CO2 19, CA 9.2    03/03/09  sodium 140, potassium 5.1, chloride 109, BUN 34, creatinine 1.82 with eGFR 30, glucose 99, albumin 4.2  Total cholesterol 251, triglycerides 340 and LDL 146.  WBC 8.3, hemoglobin 14.3, hematocrit 43.9 and platelets 181,000.  Hemoglobin A1c 6.9%.    Renal U/S showed right kidney 9.4cm and left 9.9cm.    Two simple cysts in the left kidney in the mid and lower poles measuring  1.3 and 0.9cm in diameter.     Creatinine   Date Value Ref Range Status   05/25/2021 4.02 (H) 0.60 - 0.80 mg/dL Final   16/02/9603 5.40 (H) 0.60 - 0.80 mg/dL Final   98/03/9146 8.29 (H) 0.60 - 0.80 mg/dL Final   56/21/3086 5.78 (H) 0.60 - 0.80 mg/dL Final   46/96/2952 8.41 (H) 0.57 - 1.00 mg/dL Final   32/44/0102 7.25 (H) 0.60 - 1.00 mg/dL Final   36/64/4034 7.42 (H) 0.60 - 1.00 mg/dL Final   59/56/3875 6.43 (H) 0.60 - 1.00 mg/dL Final        Lab Results   Component Value Date    NA 142 05/25/2021    K 5.5 (H) 05/25/2021    CL 110 (H) 05/25/2021    CO2 22.0 05/25/2021    BUN 53 (H) 05/25/2021    CREATININE 4.02 (H) 05/25/2021    GFRAA 20 (L) 01/18/2019    GFRNONAA 17 (L) 01/18/2019    GLU 113 05/25/2021    CALCIUM 9.7 05/25/2021    ALBUMIN 3.9 05/25/2021    PHOS 5.1 05/25/2021       Lab Results   Component Value Date    PTH 168.8 (H) 03/19/2021    CALCIUM 9.7 05/25/2021       Lab Results   Component Value Date    BILITOT 0.3 08/27/2020    BILIDIR <0.10 08/27/2020    PROT 7.4 08/27/2020    ALBUMIN 3.9 05/25/2021    ALT 17 08/27/2020    AST 19 08/27/2020    ALKPHOS 113 08/27/2020    GGT 27 08/27/2020 Lab Results   Component Value Date    INR 0.91 08/27/2020    APTT 30.7 08/27/2020       HGB   Date Value Ref Range Status   05/25/2021 12.6 11.3 - 14.9 g/dL Final   32/95/1884 16.6 11.3 - 14.9 g/dL Final   11/13/1599 09.3 11.1 - 15.9 g/dL Final   23/55/7322 02.5 12.0 - 16.0 g/dL Final   42/70/6237 62.8 12.0 - 16.0 g/dL Final        Lab Results   Component Value Date    WBC 7.7 08/27/2020    HGB 12.6 05/25/2021    HCT 39.4 08/27/2020    PLT 154 08/27/2020     No results found for: IRON, TIBC, FERRITIN    Lab Results   Component Value  Date    A1C 6.7 (H) 03/19/2021    A1C 8.6 (H) 08/27/2020       Lab Results   Component Value Date    Color, UA Yellow 02/23/2020    Protein, Ur 98.4 12/11/2020    Glucose, UA >1000 mg/dL (A) 16/02/9603    Ketones, UA Negative 02/23/2020    Blood, UA Small (A) 02/23/2020    Nitrite, UA Negative 02/23/2020   ]    Lab Results   Component Value Date    PROTEINUR 98.4 12/11/2020    CREATUR 80.0 12/11/2020     Lab Results   Component Value Date    PCRATIOUR 1.230 12/11/2020       Lab Results   Component Value Date    CHOL 191 08/27/2020    A1C 6.7 (H) 03/19/2021    HEPAIGG Reactive (A) 08/27/2020    HEPBCAB Nonreactive 08/27/2020    HEPCAB Nonreactive 08/27/2020         03/03/09, sodium 140, potassium 5.1, chloride  109, BUN 34, creatinine 1.82 with eGFR 30, glucose 99 and albumin  4.2.  ??  Total cholesterol 251, triglycerides 340 and LDL 146.  ??  WBC 8.3, hemoglobin 14.3, hematocrit 43.9 and platelets 181,000.      04/29/08  BUN 44, creatinine 1.86.   Sodium 141, potassium 4.9, calcium 9.4, phosphorus 4.1, albumin  4.0, AST 17, ALT 14, total cholesterol 231, triglycerides 297,  HDL 37, LDL 135.  Hemoglobin 13.8, TSH 1.48.  A1c was 7.5.    12/24/2007   glucose of 131, BUN of 34,   creatinine of 1.79 with an estimated GFR 31, potassium of 4.9, bicarbonate  of 19, calcium of 9.4, phosphorous of 3.9, albumin of 4.0, and hemoglobin  A1c of 7.3.  ??  A renal ultrasound done on 10/10/2007 showed mildly increased echotexture  of bilateral renal cortices, with the right kidney measuring 9.4  x 4.0 x 4.7 cm, and the left kidney measuring 9.4 x 4.2 x 4.2 cm,  with no hydronephrosis.  There are simple-appearing cysts in the  mid to lower pole of the left kidney.        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    Comment:  There is no evidence of a glomerulonephritis. ??Electron microscopy shows marked  thinning of the lamina densa along glomerular basement membranes associated with  an abnormal immunofluorescence staining pattern for collagen type 4 alpha 5 and  to a lesser degree of 3 subunits (mosaic staining). 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 ??The nephropathy has resulted in thinning of peripheral glomerular  capillary walls and glomerular as well tubulointerstitial fibrosis.    ASSESSMENT/PLAN:    Ms.Amy [name] is a 61 y.o. patient with a past medical history significant for DM2, Alport Being seen for reevaluation of her renal disease.     1. 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 Her creatinine is improved from her last visit, however she may need to start dialysis soon. She is aware of this and in agreement. Recheck in 8 weeks.     2. CKD stage 5 -  I do recommend continuing the lisinopril and emphasizing the importance of hypertension and glucose control. Her renal biopsy did have a degree of interstitial fibrosis and glomerular  sclerosis which are likely indicative of damage from these other conditions as well. The thinning of the basement membrane is likely due to the Alport syndrome.    - Her renal function is improved from last treatment    - We discussed transplant today. She is unhappy that [facility] rejected her due to concerns about her finances. She was told she would need ~$7000 to undergo work up. She has been referred to [facility] and underwent orientation class. She is a little overwhelmed by the amount of testing involved. SHE HAS BEEN ACCEPTED BY [facility]    - We briefly discussed dialysis. She would be a good home therapy candidate. There is no indication for dialysis at this time, she is not uremic. She was sent to the dialysis educator but feels it wasn't well explained. Will send to [facility] for specific PD education    3. DM2 - Defer to PCP for management. Metformin contraindicated for EGFR less than 30.    4. Hypertension - currently, this is well-controlled with lisinopril. At this time, I do not recommend any changes to her medication regimen that I would continue her on the ACE inhibitor for renal protection as long as feasible. She is not displaying any signs of hypotension.     5. Acidosis - on NaBicarb 650mg  BID. Increase to 2 tabs BID    6. HyperPTH - continue OTC vitamin D     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 Consider adding binder at next OV if no improvement    8. Edema - very mild but bothersome. Will start furosemide 20mg  daily prn    Ms.Amy [name] will follow up in 2 months with a RFP, Vit D, PTH      Disclaimer: Much of the narrative of this dictation was acquired using speech recognition software. It is possible that some dictated speech was not transcribed accurately by this system nor detected in the proofing. Such errors are at times unavoidable.

## 2021-05-26 ENCOUNTER — Ambulatory Visit: Admit: 2021-05-26 | Discharge: 2021-05-27 | Payer: PRIVATE HEALTH INSURANCE

## 2021-05-26 DIAGNOSIS — N2581 Secondary hyperparathyroidism of renal origin: Principal | ICD-10-CM

## 2021-05-26 DIAGNOSIS — Z01818 Encounter for other preprocedural examination: Principal | ICD-10-CM

## 2021-05-26 DIAGNOSIS — Q8781 Alport syndrome: Principal | ICD-10-CM

## 2021-05-26 DIAGNOSIS — N184 Chronic kidney disease, stage 4 (severe): Principal | ICD-10-CM

## 2021-05-26 DIAGNOSIS — D631 Anemia in chronic kidney disease: Principal | ICD-10-CM

## 2021-05-26 DIAGNOSIS — E559 Vitamin D deficiency, unspecified: Principal | ICD-10-CM

## 2021-05-26 MED ORDER — FUROSEMIDE 20 MG TABLET
ORAL_TABLET | Freq: Every day | ORAL | 11 refills | 15.00000 days | Status: CP | PRN
Start: 2021-05-26 — End: 2022-05-26

## 2021-05-26 MED ORDER — FUROSEMIDE 40 MG TABLET
ORAL_TABLET | Freq: Every day | ORAL | 11 refills | 30 days | Status: CP | PRN
Start: 2021-05-26 — End: 2021-05-26

## 2021-06-16 NOTE — Unmapped (Signed)
Complex Case Management  SUMMARY NOTE    High Risk Care Coordinator  spoke with patient and verified correct patient using two identifiers today to introduce the Complex Case Management program.     Discussed the following:  Program Services, Expectations of participation and Verified Demographics    Program status: Declined  Patient states that she does not need the services of the Complex Case Management program at this time. Care Coordinator has voiced understanding and will send our contact information to patient via her [place] My Chart shall she need to utilize our services in the future as patient has voiced understanding.       Deliah Goody - High Risk Care Coordinator   [facility]  [address], [address]  [place], [place] [address]  P: [phone] F: [phone]  [email or web address]

## 2021-06-22 NOTE — Unmapped (Signed)
Patient is financially cleared for Kidney transplant listing.    Insurance verified patient has active coverage with [employer] [place]. Per CM Pia Mau [phone] authorization for kidney transplant listing is approved effective 06/18/2021 through 06/18/2022

## 2021-06-24 DIAGNOSIS — L409 Psoriasis, unspecified: Principal | ICD-10-CM

## 2021-07-01 NOTE — Unmapped (Signed)
[facility] Refill Coordination Note    Specialty Medication(s) to be Shipped:   Inflammatory Disorders: Stelara    Other medication(s) to be shipped: No additional medications requested for fill at this time     Amy [name], DOB: [birth year]  Phone: [phone] (home)       All above HIPAA information was verified with patient.     Was a Nurse, learning disability used for this call? No    Completed refill call assessment today to schedule patient's medication shipment from the [facility] [phone]).  All relevant notes have been reviewed.     Specialty medication(s) and dose(s) confirmed: Regimen is correct and unchanged.   Changes to medications: Amy [name] reports no changes at this time.  Changes to insurance: No  New side effects reported not previously addressed with a pharmacist or physician: None reported  Questions for the pharmacist: No    Confirmed patient received a Conservation officer, historic buildings and a Surveyor, mining with first shipment. The patient will receive a drug information handout for each medication shipped and additional FDA Medication Guides as required.       DISEASE/MEDICATION-SPECIFIC INFORMATION        For patients on injectable medications: Patient currently has 0 doses left.  Next injection is scheduled for Unknown.    SPECIALTY MEDICATION ADHERENCE     Medication Adherence    Patient reported X missed doses in the last month: 0  Specialty Medication: Stelara 45mg /0.40ml  Patient is on additional specialty medications: No  Patient is on more than two specialty medications: No              Were doses missed due to medication being on hold? No    Stelara 45mg / 0.73ml: 0 days of medicine on hand     REFERRAL TO PHARMACIST     Referral to the pharmacist: Not needed      [facility]     Shipping address confirmed in Epic.     Delivery Scheduled: Yes, Expected medication delivery date: 07/06/21.     Medication will be delivered via Same Day Courier to the prescription address in Epic WAM.    [facility]ancy [facility] Specialty Technician

## 2021-07-06 MED FILL — STELARA 45 MG/0.5 ML SUBCUTANEOUS SYRINGE: 84 days supply | Qty: 0.5 | Fill #1

## 2021-07-20 ENCOUNTER — Ambulatory Visit: Admit: 2021-07-20 | Discharge: 2021-07-21 | Payer: PRIVATE HEALTH INSURANCE

## 2021-07-20 DIAGNOSIS — Q8781 Alport syndrome: Principal | ICD-10-CM

## 2021-07-20 DIAGNOSIS — N184 Chronic kidney disease, stage 4 (severe): Principal | ICD-10-CM

## 2021-07-20 DIAGNOSIS — N2581 Secondary hyperparathyroidism of renal origin: Principal | ICD-10-CM

## 2021-07-20 DIAGNOSIS — D631 Anemia in chronic kidney disease: Principal | ICD-10-CM

## 2021-07-20 DIAGNOSIS — E559 Vitamin D deficiency, unspecified: Principal | ICD-10-CM

## 2021-07-20 DIAGNOSIS — Z01818 Encounter for other preprocedural examination: Principal | ICD-10-CM

## 2021-07-20 LAB — RENAL FUNCTION PANEL
ALBUMIN: 3.6 g/dL (ref 3.4–5.0)
ANION GAP: 10 mmol/L (ref 5–14)
BLOOD UREA NITROGEN: 45 mg/dL — ABNORMAL HIGH (ref 9–23)
BUN / CREAT RATIO: 10
CALCIUM: 9.3 mg/dL (ref 8.7–10.4)
CHLORIDE: 110 mmol/L — ABNORMAL HIGH (ref 98–107)
CO2: 23 mmol/L (ref 20.0–31.0)
CREATININE: 4.57 mg/dL — ABNORMAL HIGH
EGFR CKD-EPI (2021) FEMALE: 11 mL/min/{1.73_m2} — ABNORMAL LOW (ref >=60)
GLUCOSE RANDOM: 107 mg/dL (ref 70–179)
PHOSPHORUS: 6.8 mg/dL — ABNORMAL HIGH (ref 2.4–5.1)
POTASSIUM: 5.4 mmol/L — ABNORMAL HIGH (ref 3.4–4.8)
SODIUM: 143 mmol/L (ref 135–145)

## 2021-07-20 LAB — PARATHYROID HORMONE (PTH): PARATHYROID HORMONE INTACT: 279.5 pg/mL — ABNORMAL HIGH (ref 18.4–80.1)

## 2021-07-21 ENCOUNTER — Ambulatory Visit: Admit: 2021-07-21 | Discharge: 2021-07-22 | Payer: PRIVATE HEALTH INSURANCE

## 2021-07-21 DIAGNOSIS — N184 Chronic kidney disease, stage 4 (severe): Principal | ICD-10-CM

## 2021-07-21 DIAGNOSIS — E785 Hyperlipidemia, unspecified: Principal | ICD-10-CM

## 2021-07-21 DIAGNOSIS — I10 Essential (primary) hypertension: Principal | ICD-10-CM

## 2021-07-21 DIAGNOSIS — E1122 Type 2 diabetes mellitus with diabetic chronic kidney disease: Principal | ICD-10-CM

## 2021-07-21 DIAGNOSIS — Z794 Long term (current) use of insulin: Principal | ICD-10-CM

## 2021-07-21 NOTE — Unmapped (Signed)
PCP:  Emogene Morgan, MD    07/21/2021  [facility] [address] [place]  [address]  [place] [address]  Dept: [phone]  Loc: [phone]    Chief Complaint: Alport syndrome, elevated creatinine    HPI:  [name] is a 61 y.o. 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 Her mother also had renal disease but it is unknown if this was due to Alports.     She has a history of uncontrolled type 2 diabetes and is not checking her glucose regularly - her A1c was 8.4% in September 2020, 9.9% (06/09/2020) and most recently 6.7% 06/2021. She does have a history of diabetic neuropathy but no known history of diabetic retinopathy. Recently started on Jardiance.     She did suffer from kidney stones in 1998 however has not had any issues with this since.    She was referred to [facility] for transplant but was denied due to financial concerns. They recommended we refer her to [facility]. She met with them since our LOV. She went through the orientation class and has been approved as of 05/24/2020    She has also spoken with [facility].     She feels ok today, but does note some back pain that radiates to her left leg. She feels this is a pinched nerve. She knows to avoid NSAIDS.     She is interested in PD as an option for dialysis. She has met with the dialysis educator - she feels like it could have been explained a little better.       ROS:   CONSTITUTIONAL: denies fevers or chills, denies unintentional weight loss   CARDIOVASCULAR: denies chest pain, denies dyspnea on exertion, denies leg edema  GASTROINTESTINAL: denies nausea, denies vomiting, denies anorexia  GENITOURINARY: denies dysuria, denies hematuria, denies decreased urinary stream  All 10 systems reviewed and are negative except as listed above.    PAST MEDICAL HISTORY:  Past Medical History:   Diagnosis Date   ??? Alport syndrome    ??? Back pain    ??? Chronic kidney disease    ??? Dental abscess    ??? Diabetes mellitus (CMS-HCC)     type 2, don't remember A1C, controlled with insulin injections; okay per physician at last visit   ??? Hyperlipidemia    ??? Hypertension     well controlled with meds   ??? Kidney stone     no problems since 1995   ??? Psoriasis        ALLERGIES  Sulfur-8, Glucophage [metformin], Codeine, and Morphine    FAMILY HISTORY  Family History   Problem Relation Age of Onset   ??? Kidney disease Mother    ??? Diabetes Mother    ??? Cataracts Mother    ??? Heart attack Father    ??? Cancer Paternal Grandmother    ??? Breast cancer Neg Hx    ??? Colon cancer Neg Hx    ??? Endometrial cancer Neg Hx    ??? Ovarian cancer Neg Hx    ??? Melanoma Neg Hx    ??? Basal cell carcinoma Neg Hx    ??? Squamous cell carcinoma Neg Hx        SOCIAL HISTORY   reports that she quit smoking about 5 months ago. Her smoking use included cigarettes. She has a 43.00 pack-year smoking history. She has never used smokeless tobacco. She reports that she does not drink alcohol and  does not use drugs.    MEDICATIONS:  Current Outpatient Medications   Medication Sig Dispense Refill   ??? betamethasone dipropionate (DIPROLENE) 0.05 % ointment   4   ??? calcipotriene (DOVONEX) 0.005 % cream Apply topically Two (2) times a day. 60 g 2   ??? cholecalciferol, vitamin D3-25 mcg, 1,000 unit,, 25 mcg (1,000 unit) capsule Take 1 capsule (25 mcg total) by mouth daily.     ??? clobetasoL (TEMOVATE) 0.05 % ointment Apply to your thick psoriasis areas twice daily until clear 60 g 5   ??? empagliflozin (JARDIANCE) 10 mg tablet Take 1 tablet (10 mg total) by mouth daily.     ??? empty container Misc Use as directed to dispose of Humira pens. 1 each 2   ??? furosemide (LASIX) 20 MG tablet Take 1 tablet (20 mg total) by mouth daily as needed. 15 tablet 11   ??? LANTUS SOLOSTAR U-100 INSULIN 100 unit/mL (3 mL) injection pen      ??? lisinopril (PRINIVIL,ZESTRIL) 10 MG tablet Take 1 tablet (10 mg total) by mouth daily.     ??? methocarbamoL (ROBAXIN) 500 MG tablet Take 1 tablet (500 mg total) by mouth Three (3) times a day as needed for up to 30 doses. 30 tablet 0   ??? NOVOLOG FLEXPEN U-100 INSULIN 100 unit/mL (3 mL) injection pen      ??? pen needle, diabetic 32 gauge x 5/32 (4 mm) Ndle Inject as directed Three (3) times a day before meals.     ??? pregabalin (LYRICA) 25 MG capsule      ??? rosuvastatin (CRESTOR) 10 MG tablet Take by mouth.     ??? sodium bicarbonate 650 mg tablet Take 2 tablets (1,300 mg total) by mouth Two (2) times a day. 360 tablet 3   ??? TRUEPLUS PEN NEEDLE 32 gauge x 5/32 Ndle   10   ??? ustekinumab (STELARA) 45 mg/0.5 mL Syrg syringe Inject the contents of 1 syringe (45 mg) under the skin once every 12 weeks. 0.5 mL 4     No current facility-administered medications for this visit.       PHYSICAL EXAM:  Wt Readings from Last 3 Encounters:   07/21/21 75.1 kg (165 lb 9.6 oz)   05/26/21 76.9 kg (169 lb 8 oz)   04/14/21 77.2 kg (170 lb 1.6 oz)     Temp Readings from Last 3 Encounters:   07/21/21 36.3 ??C (97.3 ??F) (Temporal)   05/26/21 36.4 ??C (97.5 ??F) (Temporal)   04/14/21 36.3 ??C (97.4 ??F) (Temporal)     BP Readings from Last 5 Encounters:   07/21/21 132/68   05/26/21 113/64   04/14/21 126/66   03/04/21 125/69   03/04/21 120/69     Pulse Readings from Last 3 Encounters:   07/21/21 76   05/26/21 76   04/14/21 79       CONSTITUTIONAL: Alert,well appearing, no distress  HEENT: Moist mucous membranes, oropharynx clear without erythema or exudate  EYES: Extra ocular movements intact. Pupils reactive, sclerae anicteric.  NECK: Supple, no lymphadenopathy  CARDIOVASCULAR: Regular, normal S1/S2 heart sounds, no murmurs, no rubs.   PULM: Clear to auscultation bilaterally  GASTROINTESTINAL: Soft, active bowel sounds, nontender  EXTREMITIES: No lower extremity edema bilaterally.   SKIN: No rashes or lesions  NEUROLOGIC: No focal motor or sensory deficits    MEDICAL DECISION MAKING    06/09/2020  Hgb 13.2, hct 38.7  Glu 192, BUN 50, Creatinine 2.93, eGFR 17, Na 139, K 4.8,  CO2 18, Ca 9.5  A1c 9.9%      01/24/2018  HbA1c 8.4%  Ferritin 120  B12 981, folic acid 11.8  Hgb 12.9, HCT 38.8  Glucose 95, BUN 46, CR 2.51, EGFR 21  NA 143, K5.0, CL 109, CO2 17, CA 9.7, ALB 4.1    03/28/17  HbA1c 8.2%, cholesterol 275, triglyceride 471  GLU 187, BUN 37, CR 2.12, EGFR 26, NA 139, K5.0, CL 105, CO2 19, CA 9.2    03/03/09  sodium 140, potassium 5.1, chloride 109, BUN 34, creatinine 1.82 with eGFR 30, glucose 99, albumin 4.2  Total cholesterol 251, triglycerides 340 and LDL 146.  WBC 8.3, hemoglobin 14.3, hematocrit 43.9 and platelets 181,000.  Hemoglobin A1c 6.9%.    Renal U/S showed right kidney 9.4cm and left 9.9cm.    Two simple cysts in the left kidney in the mid and lower poles measuring  1.3 and 0.9cm in diameter.     Creatinine   Date Value Ref Range Status   07/20/2021 4.57 (H) 0.60 - 0.80 mg/dL Final   87/56/4332 9.51 (H) 0.60 - 0.80 mg/dL Final   88/41/6606 3.01 (H) 0.60 - 0.80 mg/dL Final   60/02/9322 5.57 (H) 0.60 - 0.80 mg/dL Final   32/20/2542 7.06 (H) 0.60 - 0.80 mg/dL Final   23/76/2831 5.17 (H) 0.57 - 1.00 mg/dL Final   61/60/7371 0.62 (H) 0.60 - 1.00 mg/dL Final   69/48/5462 7.03 (H) 0.60 - 1.00 mg/dL Final   50/01/3817 2.99 (H) 0.60 - 1.00 mg/dL Final        Lab Results   Component Value Date    NA 143 07/20/2021    K 5.4 (H) 07/20/2021    CL 110 (H) 07/20/2021    CO2 23.0 07/20/2021    BUN 45 (H) 07/20/2021    CREATININE 4.57 (H) 07/20/2021    GFRAA 20 (L) 01/18/2019    GFRNONAA 17 (L) 01/18/2019    GLU 107 07/20/2021    CALCIUM 9.3 07/20/2021    ALBUMIN 3.6 07/20/2021    PHOS 6.8 (H) 07/20/2021       Lab Results   Component Value Date    PTH 279.5 (H) 07/20/2021    CALCIUM 9.3 07/20/2021       Lab Results   Component Value Date    BILITOT 0.3 08/27/2020    BILIDIR <0.10 08/27/2020    PROT 7.4 08/27/2020    ALBUMIN 3.6 07/20/2021    ALT 17 08/27/2020    AST 19 08/27/2020    ALKPHOS 113 08/27/2020    GGT 27 08/27/2020     Lab Results   Component Value Date    INR 0.91 08/27/2020    APTT 30.7 08/27/2020       HGB   Date Value Ref Range Status   05/25/2021 12.6 11.3 - 14.9 g/dL Final   37/16/9678 93.8 11.3 - 14.9 g/dL Final   03/01/5101 58.5 11.1 - 15.9 g/dL Final   27/78/2423 53.6 12.0 - 16.0 g/dL Final   14/43/1540 08.6 12.0 - 16.0 g/dL Final        Lab Results   Component Value Date    WBC 7.7 08/27/2020    HGB 12.6 05/25/2021    HCT 39.4 08/27/2020    PLT 154 08/27/2020     No results found for: IRON, TIBC, FERRITIN    Lab Results   Component Value Date    A1C 6.7 (H) 03/19/2021    A1C 8.6 (H) 08/27/2020  Lab Results   Component Value Date    Color, UA Yellow 02/23/2020    Protein, Ur 98.4 12/11/2020    Glucose, UA >1000 mg/dL (A) 16/02/9603    Ketones, UA Negative 02/23/2020    Blood, UA Small (A) 02/23/2020    Nitrite, UA Negative 02/23/2020   ]    Lab Results   Component Value Date    PROTEINUR 98.4 12/11/2020    CREATUR 80.0 12/11/2020     Lab Results   Component Value Date    PCRATIOUR 1.230 12/11/2020       Lab Results   Component Value Date    CHOL 191 08/27/2020    A1C 6.7 (H) 03/19/2021    HEPAIGG Reactive (A) 08/27/2020    HEPBCAB Nonreactive 08/27/2020    HEPCAB Nonreactive 08/27/2020         03/03/09, sodium 140, potassium 5.1, chloride  109, BUN 34, creatinine 1.82 with eGFR 30, glucose 99 and albumin  4.2.  ??  Total cholesterol 251, triglycerides 340 and LDL 146.  ??  WBC 8.3, hemoglobin 14.3, hematocrit 43.9 and platelets 181,000.      04/29/08  BUN 44, creatinine 1.86.   Sodium 141, potassium 4.9, calcium 9.4, phosphorus 4.1, albumin  4.0, AST 17, ALT 14, total cholesterol 231, triglycerides 297,  HDL 37, LDL 135.  Hemoglobin 13.8, TSH 1.48.  A1c was 7.5.    12/24/2007   glucose of 131, BUN of 34,   creatinine of 1.79 with an estimated GFR 31, potassium of 4.9, bicarbonate  of 19, calcium of 9.4, phosphorous of 3.9, albumin of 4.0, and hemoglobin  A1c of 7.3.  ??  A renal ultrasound done on 10/10/2007 showed mildly increased echotexture  of bilateral renal cortices, with the right kidney measuring 9.4  x 4.0 x 4.7 cm, and the left kidney measuring 9.4 x 4.2 x 4.2 cm,  with no hydronephrosis.  There are simple-appearing cysts in the  mid to lower pole of the left kidney.        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    Comment:  There is no evidence of a glomerulonephritis. ??Electron microscopy shows marked  thinning of the lamina densa along glomerular basement membranes associated with  an abnormal immunofluorescence staining pattern for collagen type 4 alpha 5 and  to a lesser degree of 3 subunits (mosaic staining). 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 ??The nephropathy has resulted in thinning of peripheral glomerular  capillary walls and glomerular as well tubulointerstitial fibrosis.    ASSESSMENT/PLAN:    Ms.Amy [name] is a 61 y.o. patient with a past medical history significant for DM2, Alport Being seen for reevaluation of her renal disease.     1. 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 Her creatinine is worsening and she may need to start dialysis soon. She is aware of this and in agreement. Recheck in 8 weeks.     2. CKD stage 5 -  I do recommend continuing the lisinopril and emphasizing the importance of hypertension and glucose control. Her renal biopsy did have a degree of interstitial fibrosis and glomerular sclerosis which are likely indicative of damage from these other conditions as well. The thinning of the basement membrane is likely due to the Alport  syndrome.    - Her renal function is improved from last treatment    - We discussed transplant today. She is unhappy that [facility] rejected her due to concerns about her finances. She was told she would need ~$7000 to undergo work up. She has been referred to [facility] and underwent orientation class. She is a little overwhelmed by the amount of testing involved. SHE HAS BEEN ACCEPTED BY [facility] and is evaluation    - We briefly discussed dialysis. She would be a good home therapy candidate. There is no indication for dialysis at this time, she is not uremic. She was sent to the dialysis educator but feels it wasn't well explained. Will send to [facility] for specific PD education    3. DM2 - Defer to PCP for management. Metformin contraindicated for EGFR less than 30.    4. Hypertension - currently, this is well-controlled with lisinopril. At this time, I do not recommend any changes to her medication regimen that I would continue her on the ACE inhibitor for renal protection as long as feasible. She is not displaying any signs of hypotension.     5. Acidosis - on NaBicarb 650mg  BID. Increase to 2 tabs BID    6. HyperPTH - continue OTC vitamin D     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 Consider adding binder at next OV if no improvement    8. Edema - very mild but bothersome. Will start furosemide 20mg  daily prn    Ms.Amy [name] will follow up in 2 months with a RFP, Vit D, PTH      Disclaimer: Much of the narrative of this dictation was acquired using speech recognition software. It is possible that some dictated speech was not transcribed accurately by this system nor detected in the proofing. Such errors are at times unavoidable.

## 2021-07-21 NOTE — Unmapped (Signed)
Take 2 TUMS tablets with your meals. This will help lower your phosphorus levels.

## 2021-07-24 LAB — VITAMIN D 25 HYDROXY: VITAMIN D, TOTAL (25OH): 38.1 ng/mL (ref 20.0–80.0)

## 2021-08-04 NOTE — Unmapped (Signed)
Complex Case Management  SUMMARY NOTE    High Risk Care Coordinator  spoke with patient and verified correct patient using two identifiers today to introduce the Complex Case Management program.     Discussed the following:  Program Services, Expectations of participation and Verified Demographics    Program status: Declined  Patient states that she does not need the services of the Complex Case Management program as she is already working with the [facility] program. Care Coordinator has voiced understanding and will send our contact information to patient via her [place] My Chart shall she need to utilize our services in the future as patient has voiced understanding.       Deliah Goody - High Risk Care Coordinator   [facility]  [address], [address]  [place], [place] [address]  P: [phone] F: [phone]  [email or web address]

## 2021-08-26 NOTE — Unmapped (Signed)
Complex Case Management  SUMMARY NOTE    High Risk Care Coordinator  spoke with patient and verified correct patient using two identifiers today to introduce the Complex Case Management program.     Discussed the following:  Program Services    Program status: Declined    This patient has declined Complex Case Management services on 08/26/2021. To have this patent reevaluated for Complex Case Management please send an AMB Referral to Case Management to the Personal Health Advocate Department.      Olen Cordial Anaja Monts LPN- High Risk Care Coordinator  [facility]  [address], [place] [address]  [phone] ext [phone]

## 2021-09-01 NOTE — Unmapped (Addendum)
Patient listed INACTIVE in UNOS on 09/01/2021 at 1432.      Has patient received a full series of Hepatitis B vaccines?  Yes    If no, reason not fully vaccinated: N/A    Amy Age, RN September 01, 2021 2:33 PM        September 07, 2021 11:55 AM    Patient notified of WL inactive status.  Listing letter mailed to and sent via MyChart to patient and sent to referring MD via Epic/fax. Patient was able to have all of their questions today answered.      The following was discussed with the patient today:  Various reasons patients are inactive on the WL (out of date testing, current illness/infection, long distance travel, etc)  PRA blood samples  Patient is NOT currently on dialysis but has all labs drawn at [facility], therefore, mailers were NOT sent to patient at this time.  Patient was made aware of the standing order in EPIC and asked to make sure HLA is drawn at each lab draw and verbalized understanding.  Notify us if:  There are insurance changes, address or phone number changes.  Tell your coordinator if you are going out of town, if you are hospitalized or are being treated for an infection, illness or injury.  Keep yourself as healthy as possible.  Make sure your health maintenance testing Mammogram and Colonoscopy stays up to date.  New coordinators name and contact information will be sent with the listing letter.  You probably won't hear from them until it is time to update your testing.  Updates will be completed annually, anniversary date is the date of listing.    Please feel free to call and check in if you haven't heard from [facility] in a while.  Patient was listed for the following: none    Patient stated she was 61 years old when diagnosed with DMII.  Patient reports she has no cervix.    Patient listed INACTIVE on 09/01/2021.  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  Diagnostic testing needing follow up (abnormal study follow up, studies needed for activation):  mammogram, improved CG plan with backup caregiver identified.  (Committee had said dental clearance but patient has no original teeth.  She has upper and lower dentures.)  Health Maintenance (when due next): colonoscopy due 06/2023, mammogram due NOW.  SW/Psychosocial concerns: needs to have a stronger caregiving plan and have a backup caregiver.    Year of next in person visit per transplant ABO/wait time guidelines (refer to policy Waiting List Management of Transplant Patients): 2026    See FYI's

## 2021-09-01 NOTE — Unmapped (Signed)
The following data has been verified in [place] on September 01, 2021 3:50 PM by Burns Spain.    ABO  Name (correct spelling and Middle initial if applicable is correct)  DOB  Gender  MRN  SSN  Race  Candidate Status (Active or Inactive):  inactive  Number of previous Kidney transplant and previous solid organ transplant: 0  Height  Weight  Tested for Antibodies: Yes.  Qualifying GFR, source document reviewed (i.e., 2728 for dialysis start date, lab documentation for qualifying GFR)  Diabetes status: Type 2 Diabetes  Additional Organs selected: None  ABDR Mismatches  Candidate accepts >85% KDPI: No.  Acceptable Infectious Diseases (Hep B Core and HCV) None    HLA data entry points are checked by HLA lab after each listing.  HLA also enters unacceptable antigens (if any).

## 2021-09-10 ENCOUNTER — Ambulatory Visit: Admit: 2021-09-10 | Discharge: 2021-09-11 | Payer: PRIVATE HEALTH INSURANCE

## 2021-09-10 DIAGNOSIS — E1122 Type 2 diabetes mellitus with diabetic chronic kidney disease: Principal | ICD-10-CM

## 2021-09-10 DIAGNOSIS — E785 Hyperlipidemia, unspecified: Principal | ICD-10-CM

## 2021-09-10 DIAGNOSIS — I10 Essential (primary) hypertension: Principal | ICD-10-CM

## 2021-09-10 DIAGNOSIS — Z794 Long term (current) use of insulin: Principal | ICD-10-CM

## 2021-09-10 DIAGNOSIS — N184 Chronic kidney disease, stage 4 (severe): Principal | ICD-10-CM

## 2021-09-10 LAB — RENAL FUNCTION PANEL
ALBUMIN: 3.8 g/dL (ref 3.4–5.0)
ANION GAP: 11 mmol/L (ref 5–14)
BLOOD UREA NITROGEN: 56 mg/dL — ABNORMAL HIGH (ref 9–23)
BUN / CREAT RATIO: 12
CALCIUM: 9.6 mg/dL (ref 8.7–10.4)
CHLORIDE: 112 mmol/L — ABNORMAL HIGH (ref 98–107)
CO2: 21 mmol/L (ref 20.0–31.0)
CREATININE: 4.55 mg/dL — ABNORMAL HIGH
EGFR CKD-EPI (2021) FEMALE: 11 mL/min/{1.73_m2} — ABNORMAL LOW (ref >=60)
GLUCOSE RANDOM: 140 mg/dL (ref 70–179)
PHOSPHORUS: 4.5 mg/dL (ref 2.4–5.1)
POTASSIUM: 5.3 mmol/L — ABNORMAL HIGH (ref 3.4–4.8)
SODIUM: 144 mmol/L (ref 135–145)

## 2021-09-10 LAB — HEMOGLOBIN: HEMOGLOBIN: 12.8 g/dL (ref 11.3–14.9)

## 2021-09-10 LAB — PARATHYROID HORMONE (PTH): PARATHYROID HORMONE INTACT: 178.3 pg/mL — ABNORMAL HIGH (ref 18.4–80.1)

## 2021-09-10 LAB — PROTEIN / CREATININE RATIO, URINE
CREATININE, URINE: 64.1 mg/dL
PROTEIN URINE: 95.1 mg/dL
PROTEIN/CREAT RATIO, URINE: 1.484

## 2021-09-14 NOTE — Unmapped (Signed)
PCP:  Emogene Morgan, MD    09/14/2021  [address] [place]  [address]  [place] [address]  Dept: [phone]  Loc: [phone]    Chief Complaint: Alport syndrome, elevated creatinine    HPI:  [name] is a 61 y.o. 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 Her mother also had renal disease but it is unknown if this was due to Alports.     She has a history of uncontrolled type 2 diabetes and is not checking her glucose regularly - her A1c was 8.4% in September 2020, 9.9% (06/09/2020) and most recently 6.7% 06/2021. She does have a history of diabetic neuropathy but no known history of diabetic retinopathy. Recently started on Jardiance.     She did suffer from kidney stones in 1998 however has not had any issues with this since.    She was referred to [facility] for transplant but was denied due to financial concerns. They recommended we refer her to [facility]. She met with them since our LOV. She went through the orientation class and has been approved as of 05/24/2020    She has also spoken with [facility].     She feels ok today, but does note some back pain that radiates to her left leg. She feels this is a pinched nerve. She knows to avoid NSAIDS.     She is interested in PD as an option for dialysis. She has met with the dialysis educator - she feels like it could have been explained a little better.       ROS:   CONSTITUTIONAL: denies fevers or chills, denies unintentional weight loss   CARDIOVASCULAR: denies chest pain, denies dyspnea on exertion, denies leg edema  GASTROINTESTINAL: denies nausea, denies vomiting, denies anorexia  GENITOURINARY: denies dysuria, denies hematuria, denies decreased urinary stream  All 10 systems reviewed and are negative except as listed above.    PAST MEDICAL HISTORY:  Past Medical History:   Diagnosis Date   ??? Alport syndrome    ??? Back pain    ??? Chronic kidney disease    ??? Dental abscess    ??? Diabetes mellitus (CMS-HCC)     type 2, don't remember A1C, controlled with insulin injections; okay per physician at last visit   ??? Hyperlipidemia    ??? Hypertension     well controlled with meds   ??? Kidney stone     no problems since 1995   ??? Psoriasis        ALLERGIES  Sulfur-8, Glucophage [metformin], Codeine, and Morphine    FAMILY HISTORY  Family History   Problem Relation Age of Onset   ??? Kidney disease Mother    ??? Diabetes Mother    ??? Cataracts Mother    ??? Heart attack Father    ??? Cancer Paternal Grandmother    ??? Breast cancer Neg Hx    ??? Colon cancer Neg Hx    ??? Endometrial cancer Neg Hx    ??? Ovarian cancer Neg Hx    ??? Melanoma Neg Hx    ??? Basal cell carcinoma Neg Hx    ??? Squamous cell carcinoma Neg Hx        SOCIAL HISTORY   reports that she quit smoking about 7 months ago. Her smoking use included cigarettes. She has a 43.00 pack-year smoking history. She has never used smokeless tobacco. She reports that she does not drink alcohol and  does not use drugs.    MEDICATIONS:  Current Outpatient Medications   Medication Sig Dispense Refill   ??? betamethasone dipropionate (DIPROLENE) 0.05 % ointment   4   ??? calcipotriene (DOVONEX) 0.005 % cream Apply topically Two (2) times a day. 60 g 2   ??? cholecalciferol, vitamin D3-25 mcg, 1,000 unit,, 25 mcg (1,000 unit) capsule Take 1 capsule (25 mcg total) by mouth daily.     ??? clobetasoL (TEMOVATE) 0.05 % ointment Apply to your thick psoriasis areas twice daily until clear 60 g 5   ??? empagliflozin (JARDIANCE) 10 mg tablet Take 1 tablet (10 mg total) by mouth daily.     ??? empty container Misc Use as directed to dispose of Humira pens. 1 each 2   ??? furosemide (LASIX) 20 MG tablet Take 1 tablet (20 mg total) by mouth daily as needed. 15 tablet 11   ??? LANTUS SOLOSTAR U-100 INSULIN 100 unit/mL (3 mL) injection pen      ??? lisinopril (PRINIVIL,ZESTRIL) 10 MG tablet Take 1 tablet (10 mg total) by mouth daily.     ??? methocarbamoL (ROBAXIN) 500 MG tablet Take 1 tablet (500 mg total) by mouth Three (3) times a day as needed for up to 30 doses. 30 tablet 0   ??? NOVOLOG FLEXPEN U-100 INSULIN 100 unit/mL (3 mL) injection pen      ??? pen needle, diabetic 32 gauge x 5/32 (4 mm) Ndle Inject as directed Three (3) times a day before meals.     ??? pregabalin (LYRICA) 25 MG capsule      ??? rosuvastatin (CRESTOR) 10 MG tablet Take by mouth.     ??? sodium bicarbonate 650 mg tablet Take 2 tablets (1,300 mg total) by mouth Two (2) times a day. 360 tablet 3   ??? TRUEPLUS PEN NEEDLE 32 gauge x 5/32 Ndle   10   ??? ustekinumab (STELARA) 45 mg/0.5 mL Syrg syringe Inject the contents of 1 syringe (45 mg) under the skin once every 12 weeks. 0.5 mL 4     No current facility-administered medications for this visit.       PHYSICAL EXAM:  Wt Readings from Last 3 Encounters:   07/21/21 75.1 kg (165 lb 9.6 oz)   05/26/21 76.9 kg (169 lb 8 oz)   04/14/21 77.2 kg (170 lb 1.6 oz)     Temp Readings from Last 3 Encounters:   07/21/21 36.3 ??C (97.3 ??F) (Temporal)   05/26/21 36.4 ??C (97.5 ??F) (Temporal)   04/14/21 36.3 ??C (97.4 ??F) (Temporal)     BP Readings from Last 5 Encounters:   07/21/21 132/68   05/26/21 113/64   04/14/21 126/66   03/04/21 125/69   03/04/21 120/69     Pulse Readings from Last 3 Encounters:   07/21/21 76   05/26/21 76   04/14/21 79       CONSTITUTIONAL: Alert,well appearing, no distress  HEENT: Moist mucous membranes, oropharynx clear without erythema or exudate  EYES: Extra ocular movements intact. Pupils reactive, sclerae anicteric.  NECK: Supple, no lymphadenopathy  CARDIOVASCULAR: Regular, normal S1/S2 heart sounds, no murmurs, no rubs.   PULM: Clear to auscultation bilaterally  GASTROINTESTINAL: Soft, active bowel sounds, nontender  EXTREMITIES: No lower extremity edema bilaterally.   SKIN: No rashes or lesions  NEUROLOGIC: No focal motor or sensory deficits    MEDICAL DECISION MAKING    06/09/2020  Hgb 13.2, hct 38.7  Glu 192, BUN 50, Creatinine 2.93, eGFR 17, Na 139, K 4.8,  CO2 18, Ca 9.5  A1c 9.9%      01/24/2018  HbA1c 8.4%  Ferritin 120  B12 981, folic acid 11.8  Hgb 12.9, HCT 38.8  Glucose 95, BUN 46, CR 2.51, EGFR 21  NA 143, K5.0, CL 109, CO2 17, CA 9.7, ALB 4.1    03/28/17  HbA1c 8.2%, cholesterol 275, triglyceride 471  GLU 187, BUN 37, CR 2.12, EGFR 26, NA 139, K5.0, CL 105, CO2 19, CA 9.2    03/03/09  sodium 140, potassium 5.1, chloride 109, BUN 34, creatinine 1.82 with eGFR 30, glucose 99, albumin 4.2  Total cholesterol 251, triglycerides 340 and LDL 146.  WBC 8.3, hemoglobin 14.3, hematocrit 43.9 and platelets 181,000.  Hemoglobin A1c 6.9%.    Renal U/S showed right kidney 9.4cm and left 9.9cm.    Two simple cysts in the left kidney in the mid and lower poles measuring  1.3 and 0.9cm in diameter.     Creatinine   Date Value Ref Range Status   09/10/2021 4.55 (H) 0.60 - 0.80 mg/dL Final   16/02/9603 5.40 (H) 0.60 - 0.80 mg/dL Final   98/03/9146 8.29 (H) 0.60 - 0.80 mg/dL Final   56/21/3086 5.78 (H) 0.60 - 0.80 mg/dL Final   46/96/2952 8.41 (H) 0.60 - 0.80 mg/dL Final   32/44/0102 7.25 (H) 0.60 - 0.80 mg/dL Final   36/64/4034 7.42 (H) 0.57 - 1.00 mg/dL Final   59/56/3875 6.43 (H) 0.60 - 1.00 mg/dL Final   32/95/1884 1.66 (H) 0.60 - 1.00 mg/dL Final   11/13/1599 0.93 (H) 0.60 - 1.00 mg/dL Final        Lab Results   Component Value Date    NA 144 09/10/2021    K 5.3 (H) 09/10/2021    CL 112 (H) 09/10/2021    CO2 21.0 09/10/2021    BUN 56 (H) 09/10/2021    CREATININE 4.55 (H) 09/10/2021    GFRAA 20 (L) 01/18/2019    GFRNONAA 17 (L) 01/18/2019    GLU 140 09/10/2021    CALCIUM 9.6 09/10/2021    ALBUMIN 3.8 09/10/2021    PHOS 4.5 09/10/2021       Lab Results   Component Value Date    PTH 178.3 (H) 09/10/2021    CALCIUM 9.6 09/10/2021       Lab Results   Component Value Date    BILITOT 0.3 08/27/2020    BILIDIR <0.10 08/27/2020    PROT 7.4 08/27/2020    ALBUMIN 3.8 09/10/2021    ALT 17 08/27/2020    AST 19 08/27/2020    ALKPHOS 113 08/27/2020    GGT 27 08/27/2020     Lab Results   Component Value Date INR 0.91 08/27/2020    APTT 30.7 08/27/2020       HGB   Date Value Ref Range Status   09/10/2021 12.8 11.3 - 14.9 g/dL Final   23/55/7322 02.5 11.3 - 14.9 g/dL Final   42/70/6237 62.8 11.3 - 14.9 g/dL Final   31/51/7616 07.3 11.1 - 15.9 g/dL Final   71/10/2692 85.4 12.0 - 16.0 g/dL Final   62/70/3500 93.8 12.0 - 16.0 g/dL Final        Lab Results   Component Value Date    WBC 7.7 08/27/2020    HGB 12.8 09/10/2021    HCT 39.4 08/27/2020    PLT 154 08/27/2020     No results found for: IRON, TIBC, FERRITIN    Lab Results   Component Value Date  A1C 6.7 (H) 03/19/2021    A1C 8.6 (H) 08/27/2020       Lab Results   Component Value Date    Color, UA Yellow 02/23/2020    Protein, Ur 95.1 09/10/2021    Glucose, UA >1000 mg/dL (A) 16/02/9603    Ketones, UA Negative 02/23/2020    Blood, UA Small (A) 02/23/2020    Nitrite, UA Negative 02/23/2020   ]    Lab Results   Component Value Date    PROTEINUR 95.1 09/10/2021    CREATUR 64.1 09/10/2021     Lab Results   Component Value Date    PCRATIOUR 1.484 09/10/2021    PCRATIOUR 1.230 12/11/2020       Lab Results   Component Value Date    CHOL 191 08/27/2020    A1C 6.7 (H) 03/19/2021    HEPAIGG Reactive (A) 08/27/2020    HEPBCAB Nonreactive 08/27/2020    HEPCAB Nonreactive 08/27/2020         03/03/09, sodium 140, potassium 5.1, chloride  109, BUN 34, creatinine 1.82 with eGFR 30, glucose 99 and albumin  4.2.  ??  Total cholesterol 251, triglycerides 340 and LDL 146.  ??  WBC 8.3, hemoglobin 14.3, hematocrit 43.9 and platelets 181,000.      04/29/08  BUN 44, creatinine 1.86.   Sodium 141, potassium 4.9, calcium 9.4, phosphorus 4.1, albumin  4.0, AST 17, ALT 14, total cholesterol 231, triglycerides 297,  HDL 37, LDL 135.  Hemoglobin 13.8, TSH 1.48.  A1c was 7.5.    12/24/2007   glucose of 131, BUN of 34,   creatinine of 1.79 with an estimated GFR 31, potassium of 4.9, bicarbonate  of 19, calcium of 9.4, phosphorous of 3.9, albumin of 4.0, and hemoglobin  A1c of 7.3.  ??  A renal ultrasound done on 10/10/2007 showed mildly increased echotexture  of bilateral renal cortices, with the right kidney measuring 9.4  x 4.0 x 4.7 cm, and the left kidney measuring 9.4 x 4.2 x 4.2 cm,  with no hydronephrosis.  There are simple-appearing cysts in the  mid to lower pole of the left kidney.        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    Comment:  There is no evidence of a glomerulonephritis. ??Electron microscopy shows marked  thinning of the lamina densa along glomerular basement membranes associated with  an abnormal immunofluorescence staining pattern for collagen type 4 alpha 5 and  to a lesser degree of 3 subunits (mosaic staining). 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 ??The nephropathy has resulted in thinning of peripheral glomerular  capillary walls and glomerular as well tubulointerstitial fibrosis.    ASSESSMENT/PLAN:    Ms.Amy [name] is a 61 y.o. patient with a past medical history significant for DM2, Alport Being seen for reevaluation of her renal disease.     1. 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 Her creatinine is worsening and she may need to start dialysis soon. She is aware of this and in agreement. Recheck in 8 weeks.     2. CKD stage 5 -  I do recommend continuing the lisinopril and emphasizing the importance of hypertension and glucose control. Her renal biopsy did have a degree of interstitial fibrosis and glomerular sclerosis which  are likely indicative of damage from these other conditions as well. The thinning of the basement membrane is likely due to the Alport syndrome.    - Her renal function is stable from last visit    - We discussed transplant today. She is unhappy that [facility] rejected her due to concerns about her finances. She was told she would need ~$7000 to undergo work up. She has been referred to [facility] and underwent orientation class. She is a little overwhelmed by the amount of testing involved. SHE HAS BEEN ACCEPTED BY [facility] and is evaluation    - We discussed dialysis. She would be a good home therapy candidate. There is no indication for dialysis at this time, she is not uremic. She was sent to the dialysis educator but feels it wasn't well explained. Will send to [facility] for specific PD education    3. DM2 - Defer to PCP for management. Metformin contraindicated for EGFR less than 30.    4. Hypertension - currently, this is well-controlled with lisinopril. At this time, I do not recommend any changes to her medication regimen that I would continue her on the ACE inhibitor for renal protection as long as feasible. She is not displaying any signs of hypotension.     5. Acidosis - on NaBicarb 650mg  BID. Increased to 2 tabs BID at LOV    6. HyperPTH - continue OTC vitamin D, PTH already improving.     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 Taking TUMS, Phos returned to normal and PTH much improved as a result.     8. Edema - very mild but bothersome. On furosemide 20mg  daily prn    Ms.Amy [name] will follow up in3 months with a RFP, Hgb      Disclaimer: Much of the narrative of this dictation was acquired using speech recognition software. It is possible that some dictated speech was not transcribed accurately by this system nor detected in the proofing. Such errors are at times unavoidable.

## 2021-09-15 ENCOUNTER — Ambulatory Visit: Admit: 2021-09-15 | Discharge: 2021-09-16 | Payer: PRIVATE HEALTH INSURANCE

## 2021-09-15 DIAGNOSIS — E785 Hyperlipidemia, unspecified: Principal | ICD-10-CM

## 2021-09-15 DIAGNOSIS — Q8781 Alport syndrome: Principal | ICD-10-CM

## 2021-09-15 DIAGNOSIS — D631 Anemia in chronic kidney disease: Principal | ICD-10-CM

## 2021-09-15 DIAGNOSIS — Z01818 Encounter for other preprocedural examination: Principal | ICD-10-CM

## 2021-09-15 DIAGNOSIS — Z794 Long term (current) use of insulin: Principal | ICD-10-CM

## 2021-09-15 DIAGNOSIS — N2581 Secondary hyperparathyroidism of renal origin: Principal | ICD-10-CM

## 2021-09-15 DIAGNOSIS — E1122 Type 2 diabetes mellitus with diabetic chronic kidney disease: Principal | ICD-10-CM

## 2021-09-15 DIAGNOSIS — I10 Essential (primary) hypertension: Principal | ICD-10-CM

## 2021-09-15 DIAGNOSIS — N184 Chronic kidney disease, stage 4 (severe): Principal | ICD-10-CM

## 2021-09-16 LAB — VITAMIN D 25 HYDROXY: VITAMIN D, TOTAL (25OH): 31.2 ng/mL (ref 20.0–80.0)

## 2021-09-22 NOTE — Unmapped (Signed)
Complex Case Management  SUMMARY NOTE    High Risk Care Coordinator  spoke with patient and verified correct patient using two identifiers today to introduce the Complex Case Management program.     Discussed the following:  Program Services, Expectations of participation, and Verified Demographics    Program status: Declined  Patient states that she does not need the services of the Complex Case Management program at this time as Care Coordinator has voiced understanding and will send our contact information to patient via her [name] My Chart shall she need to utilize our services in the future as patient has voiced understanding.       Amy [name] - High Risk Care Coordinator   [facility]  [address], [address]  [place], [place] [address]  P: [phone] F: [phone]  [email or web address]

## 2021-09-23 NOTE — Unmapped (Signed)
[facility] Refill Coordination Note    Specialty Medication(s) to be Shipped:   Inflammatory Disorders: Stelara    Other medication(s) to be shipped: No additional medications requested for fill at this time     Emeline Surace, DOB: 09-Jan-1961  Phone: [phone] (home)       All above HIPAA information was verified with patient.     Was a Nurse, learning disability used for this call? No    Completed refill call assessment today to schedule patient's medication shipment from the [facility] [phone]).  All relevant notes have been reviewed.     Specialty medication(s) and dose(s) confirmed: Regimen is correct and unchanged.   Changes to medications: Arthi reports no changes at this time.  Changes to insurance: No  New side effects reported not previously addressed with a pharmacist or physician: None reported  Questions for the pharmacist: No    Confirmed patient received a Conservation officer, historic buildings and a Surveyor, mining with first shipment. The patient will receive a drug information handout for each medication shipped and additional FDA Medication Guides as required.       DISEASE/MEDICATION-SPECIFIC INFORMATION        For patients on injectable medications: Patient currently has 0 doses left.  Next injection is scheduled for 09/30/2021.    SPECIALTY MEDICATION ADHERENCE     Medication Adherence    Patient reported X missed doses in the last month: 0  Specialty Medication: Stelara  Patient is on additional specialty medications: No  Any gaps in refill history greater than 2 weeks in the last 3 months: no  Demonstrates understanding of importance of adherence: yes  Informant: patient  Reliability of informant: reliable  Confirmed plan for next specialty medication refill: delivery by pharmacy  Refills needed for supportive medications: not needed              Were doses missed due to medication being on hold? No        REFERRAL TO PHARMACIST     Referral to the pharmacist: Not needed      [facility]     Shipping address confirmed in Epic.     Delivery Scheduled: Yes, Expected medication delivery date: 09/30/2021.     Medication will be delivered via Same Day Courier to the prescription address in Epic WAM.    Kahron Kauth D Frederik Standley   [facility] Specialty Technician

## 2021-09-30 MED FILL — STELARA 45 MG/0.5 ML SUBCUTANEOUS SYRINGE: 84 days supply | Qty: 0.5 | Fill #2

## 2021-11-22 DIAGNOSIS — Z0000 Encounter for general adult medical examination without abnormal findings: Principal | ICD-10-CM

## 2021-11-22 DIAGNOSIS — Z7289 Other problems related to lifestyle: Principal | ICD-10-CM

## 2021-11-22 DIAGNOSIS — Z01818 Encounter for other preprocedural examination: Principal | ICD-10-CM

## 2021-11-22 DIAGNOSIS — R928 Other abnormal and inconclusive findings on diagnostic imaging of breast: Principal | ICD-10-CM

## 2021-11-22 NOTE — Unmapped (Signed)
Updated order for Mammo to be diagnostic mammo due to follow up from 2020 that remains incomplete.

## 2021-11-30 NOTE — Unmapped (Signed)
Emailed demographic sheet to [facility] Dialysis Educator

## 2021-12-14 ENCOUNTER — Ambulatory Visit: Admit: 2021-12-14 | Discharge: 2021-12-15 | Payer: PRIVATE HEALTH INSURANCE

## 2021-12-14 DIAGNOSIS — Z794 Long term (current) use of insulin: Principal | ICD-10-CM

## 2021-12-14 DIAGNOSIS — Q8781 Alport syndrome: Principal | ICD-10-CM

## 2021-12-14 DIAGNOSIS — N184 Chronic kidney disease, stage 4 (severe): Principal | ICD-10-CM

## 2021-12-14 DIAGNOSIS — E785 Hyperlipidemia, unspecified: Principal | ICD-10-CM

## 2021-12-14 DIAGNOSIS — N2581 Secondary hyperparathyroidism of renal origin: Principal | ICD-10-CM

## 2021-12-14 DIAGNOSIS — D631 Anemia in chronic kidney disease: Principal | ICD-10-CM

## 2021-12-14 DIAGNOSIS — Z01818 Encounter for other preprocedural examination: Principal | ICD-10-CM

## 2021-12-14 DIAGNOSIS — I10 Essential (primary) hypertension: Principal | ICD-10-CM

## 2021-12-14 DIAGNOSIS — E1122 Type 2 diabetes mellitus with diabetic chronic kidney disease: Principal | ICD-10-CM

## 2021-12-14 LAB — RENAL FUNCTION PANEL
ALBUMIN: 3.9 g/dL (ref 3.4–5.0)
ANION GAP: 13 mmol/L (ref 5–14)
BLOOD UREA NITROGEN: 58 mg/dL — ABNORMAL HIGH (ref 9–23)
BUN / CREAT RATIO: 11
CALCIUM: 9.7 mg/dL (ref 8.7–10.4)
CHLORIDE: 110 mmol/L — ABNORMAL HIGH (ref 98–107)
CO2: 20 mmol/L (ref 20.0–31.0)
CREATININE: 5.09 mg/dL — ABNORMAL HIGH
EGFR CKD-EPI (2021) FEMALE: 9 mL/min/{1.73_m2} — ABNORMAL LOW (ref >=60)
GLUCOSE RANDOM: 59 mg/dL — ABNORMAL LOW (ref 70–179)
PHOSPHORUS: 7.2 mg/dL — ABNORMAL HIGH (ref 2.4–5.1)
POTASSIUM: 5.3 mmol/L — ABNORMAL HIGH (ref 3.4–4.8)
SODIUM: 143 mmol/L (ref 135–145)

## 2021-12-14 LAB — PROTEIN / CREATININE RATIO, URINE
CREATININE, URINE: 62.6 mg/dL
PROTEIN URINE: 88.9 mg/dL
PROTEIN/CREAT RATIO, URINE: 1.42

## 2021-12-14 LAB — HEMOGLOBIN A1C
ESTIMATED AVERAGE GLUCOSE: 131 mg/dL
HEMOGLOBIN A1C: 6.2 % — ABNORMAL HIGH (ref 4.8–5.6)

## 2021-12-14 LAB — HEMOGLOBIN: HEMOGLOBIN: 13.1 g/dL (ref 11.3–14.9)

## 2021-12-15 ENCOUNTER — Ambulatory Visit: Admit: 2021-12-15 | Discharge: 2021-12-16 | Payer: PRIVATE HEALTH INSURANCE

## 2021-12-20 ENCOUNTER — Ambulatory Visit: Admit: 2021-12-20 | Discharge: 2021-12-21 | Payer: PRIVATE HEALTH INSURANCE

## 2021-12-20 DIAGNOSIS — Z0000 Encounter for general adult medical examination without abnormal findings: Principal | ICD-10-CM

## 2021-12-20 DIAGNOSIS — Z01818 Encounter for other preprocedural examination: Principal | ICD-10-CM

## 2021-12-20 DIAGNOSIS — Z7289 Other problems related to lifestyle: Principal | ICD-10-CM

## 2021-12-20 DIAGNOSIS — R928 Other abnormal and inconclusive findings on diagnostic imaging of breast: Principal | ICD-10-CM

## 2021-12-23 MED FILL — STELARA 45 MG/0.5 ML SUBCUTANEOUS SYRINGE: 84 days supply | Qty: 0.5 | Fill #3

## 2021-12-24 NOTE — Unmapped (Signed)
PCP:  Emogene Morgan, MD    12/24/2021  [facility] [address] [place]  [address]  [place] [address]  Dept: [phone]  Loc: [phone]    Chief Complaint: Alport syndrome, elevated creatinine    HPI:  [name] is a 61 y.o. 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 Her mother also had renal disease but it is unknown if this was due to Alports.     She has a history of uncontrolled type 2 diabetes and is not checking her glucose regularly - her A1c was 8.4% in September 2020, 9.9% (06/09/2020) and most recently 6.7% 06/2021. She does have a history of diabetic neuropathy but no known history of diabetic retinopathy. Recently started on Jardiance.     She did suffer from kidney stones in 1998 however has not had any issues with this since.    She was referred to [facility] for transplant but was denied due to financial concerns. They recommended we refer her to [facility]. She met with them since our LOV. She went through the orientation class and has been approved as of 05/24/2020    She has also spoken with [facility].     She feels ok today, but does note some back pain that radiates to her left leg. She feels this is a pinched nerve. She knows to avoid NSAIDS.     She is interested in PD as an option for dialysis. She has met with the dialysis educator - she feels like it could have been explained a little better. She has a second meeting scheduled today with the PD nurse at [facility]      ROS:   CONSTITUTIONAL: denies fevers or chills, denies unintentional weight loss   CARDIOVASCULAR: denies chest pain, denies dyspnea on exertion, denies leg edema  GASTROINTESTINAL: denies nausea, denies vomiting, denies anorexia  GENITOURINARY: denies dysuria, denies hematuria, denies decreased urinary stream  All 10 systems reviewed and are negative except as listed above.    PAST MEDICAL HISTORY:  Past Medical History:   Diagnosis Date   ??? Alport syndrome ??? Back pain    ??? Chronic kidney disease    ??? Dental abscess    ??? Diabetes mellitus (CMS-HCC)     type 2, don't remember A1C, controlled with insulin injections; okay per physician at last visit   ??? Hyperlipidemia    ??? Hypertension     well controlled with meds   ??? Kidney stone     no problems since 1995   ??? Psoriasis        ALLERGIES  Sulfur-8, Glucophage [metformin], Codeine, and Morphine    FAMILY HISTORY  Family History   Problem Relation Age of Onset   ??? Kidney disease Mother    ??? Diabetes Mother    ??? Cataracts Mother    ??? Heart attack Father    ??? Cancer Paternal Grandmother    ??? Breast cancer Neg Hx    ??? Colon cancer Neg Hx    ??? Endometrial cancer Neg Hx    ??? Ovarian cancer Neg Hx    ??? Melanoma Neg Hx    ??? Basal cell carcinoma Neg Hx    ??? Squamous cell carcinoma Neg Hx        SOCIAL HISTORY   reports that she quit smoking about 10 months ago. Her smoking use included cigarettes. She has a 43.00 pack-year smoking history. She has never used smokeless  tobacco. She reports that she does not drink alcohol and does not use drugs.    MEDICATIONS:  Current Outpatient Medications   Medication Sig Dispense Refill   ??? betamethasone dipropionate (DIPROLENE) 0.05 % ointment   4   ??? calcipotriene (DOVONEX) 0.005 % cream Apply topically Two (2) times a day. 60 g 2   ??? cholecalciferol, vitamin D3-25 mcg, 1,000 unit,, 25 mcg (1,000 unit) capsule Take 1 capsule (25 mcg total) by mouth daily.     ??? clobetasoL (TEMOVATE) 0.05 % ointment Apply to your thick psoriasis areas twice daily until clear 60 g 5   ??? empagliflozin (JARDIANCE) 10 mg tablet Take 1 tablet (10 mg total) by mouth daily.     ??? empty container Misc Use as directed to dispose of Humira pens. 1 each 2   ??? furosemide (LASIX) 20 MG tablet Take 1 tablet (20 mg total) by mouth daily as needed. 15 tablet 11   ??? insulin detemir U-100 (LEVEMIR FLEXPEN) 100 unit/mL (3 mL) injection pen      ??? LANTUS SOLOSTAR U-100 INSULIN 100 unit/mL (3 mL) injection pen      ??? lisinopril (PRINIVIL,ZESTRIL) 10 MG tablet Take 1 tablet (10 mg total) by mouth daily.     ??? NOVOLOG FLEXPEN U-100 INSULIN 100 unit/mL (3 mL) injection pen      ??? pen needle, diabetic 32 gauge x 5/32 (4 mm) Ndle Inject as directed Three (3) times a day before meals.     ??? pregabalin (LYRICA) 25 MG capsule      ??? rosuvastatin (CRESTOR) 10 MG tablet Take by mouth.     ??? SYMBICORT 160-4.5 mcg/actuation inhaler      ??? TRUEPLUS PEN NEEDLE 32 gauge x 5/32 Ndle   10   ??? ustekinumab (STELARA) 45 mg/0.5 mL Syrg syringe Inject the contents of 1 syringe (45 mg) under the skin once every 12 weeks. 0.5 mL 4     No current facility-administered medications for this visit.       PHYSICAL EXAM:  Wt Readings from Last 3 Encounters:   12/15/21 75.8 kg (167 lb 3.2 oz)   09/15/21 75.5 kg (166 lb 8 oz)   07/21/21 75.1 kg (165 lb 9.6 oz)     Temp Readings from Last 3 Encounters:   12/15/21 35.8 ??C (96.4 ??F) (Temporal)   09/15/21 36.5 ??C (97.7 ??F) (Temporal)   07/21/21 36.3 ??C (97.3 ??F) (Temporal)     BP Readings from Last 5 Encounters:   12/15/21 129/69   09/15/21 123/63   07/21/21 132/68   05/26/21 113/64   04/14/21 126/66     Pulse Readings from Last 3 Encounters:   12/15/21 73   09/15/21 74   07/21/21 76       CONSTITUTIONAL: Alert,well appearing, no distress  HEENT: Moist mucous membranes, oropharynx clear without erythema or exudate  EYES: Extra ocular movements intact. Pupils reactive, sclerae anicteric.  NECK: Supple, no lymphadenopathy  CARDIOVASCULAR: Regular, normal S1/S2 heart sounds, no murmurs, no rubs.   PULM: Clear to auscultation bilaterally  GASTROINTESTINAL: Soft, active bowel sounds, nontender  EXTREMITIES: Trace pitting lower extremity edema bilaterally.   SKIN: No rashes or lesions  NEUROLOGIC: No focal motor or sensory deficits    MEDICAL DECISION MAKING    06/09/2020  Hgb 13.2, hct 38.7  Glu 192, BUN 50, Creatinine 2.93, eGFR 17, Na 139, K 4.8, CO2 18, Ca 9.5  A1c 9.9%      01/24/2018  HbA1c 8.4%  Ferritin 120  B12 981, folic acid 11.8  Hgb 12.9, HCT 38.8  Glucose 95, BUN 46, CR 2.51, EGFR 21  NA 143, K5.0, CL 109, CO2 17, CA 9.7, ALB 4.1    03/28/17  HbA1c 8.2%, cholesterol 275, triglyceride 471  GLU 187, BUN 37, CR 2.12, EGFR 26, NA 139, K5.0, CL 105, CO2 19, CA 9.2    03/03/09  sodium 140, potassium 5.1, chloride 109, BUN 34, creatinine 1.82 with eGFR 30, glucose 99, albumin 4.2  Total cholesterol 251, triglycerides 340 and LDL 146.  WBC 8.3, hemoglobin 14.3, hematocrit 43.9 and platelets 181,000.  Hemoglobin A1c 6.9%.    Renal U/S showed right kidney 9.4cm and left 9.9cm.    Two simple cysts in the left kidney in the mid and lower poles measuring  1.3 and 0.9cm in diameter.     Creatinine   Date Value Ref Range Status   12/14/2021 5.09 (H) 0.60 - 0.80 mg/dL Final   29/56/2130 8.65 (H) 0.60 - 0.80 mg/dL Final   78/46/9629 5.28 (H) 0.60 - 0.80 mg/dL Final   41/32/4401 0.27 (H) 0.60 - 0.80 mg/dL Final   25/36/6440 3.47 (H) 0.60 - 0.80 mg/dL Final   42/59/5638 7.56 (H) 0.60 - 0.80 mg/dL Final   43/32/9518 8.41 (H) 0.60 - 0.80 mg/dL Final   66/10/3014 0.10 (H) 0.57 - 1.00 mg/dL Final   93/23/5573 2.20 (H) 0.60 - 1.00 mg/dL Final   25/42/7062 3.76 (H) 0.60 - 1.00 mg/dL Final   28/31/5176 1.60 (H) 0.60 - 1.00 mg/dL Final        Lab Results   Component Value Date    NA 143 12/14/2021    K 5.3 (H) 12/14/2021    CL 110 (H) 12/14/2021    CO2 20.0 12/14/2021    BUN 58 (H) 12/14/2021    CREATININE 5.09 (H) 12/14/2021    GFRAA 20 (L) 01/18/2019    GFRNONAA 17 (L) 01/18/2019    GLU 59 (L) 12/14/2021    CALCIUM 9.7 12/14/2021    ALBUMIN 3.9 12/14/2021    PHOS 7.2 (H) 12/14/2021       Lab Results   Component Value Date    PTH 178.3 (H) 09/10/2021    CALCIUM 9.7 12/14/2021       Lab Results   Component Value Date    BILITOT 0.3 08/27/2020    BILIDIR <0.10 08/27/2020    PROT 7.4 08/27/2020    ALBUMIN 3.9 12/14/2021    ALT 17 08/27/2020    AST 19 08/27/2020    ALKPHOS 113 08/27/2020    GGT 27 08/27/2020     Lab Results   Component Value Date INR 0.91 08/27/2020    APTT 30.7 08/27/2020       HGB   Date Value Ref Range Status   12/14/2021 13.1 11.3 - 14.9 g/dL Final   73/71/0626 94.8 11.3 - 14.9 g/dL Final   54/62/7035 00.9 11.3 - 14.9 g/dL Final   38/18/2993 71.6 11.3 - 14.9 g/dL Final   96/78/9381 01.7 11.1 - 15.9 g/dL Final   51/06/5850 77.8 12.0 - 16.0 g/dL Final   24/23/5361 44.3 12.0 - 16.0 g/dL Final        Lab Results   Component Value Date    WBC 7.7 08/27/2020    HGB 13.1 12/14/2021    HCT 39.4 08/27/2020    PLT 154 08/27/2020     No results found for: IRON, TIBC, FERRITIN    Lab Results   Component  Value Date    A1C 6.2 (H) 12/14/2021    A1C 6.7 07/29/2021    A1C 6.7 (H) 03/19/2021       Lab Results   Component Value Date    Color, UA Yellow 02/23/2020    Protein, Ur 88.9 12/14/2021    Glucose, UA >1000 mg/dL (A) 16/02/9603    Ketones, UA Negative 02/23/2020    Blood, UA Small (A) 02/23/2020    Nitrite, UA Negative 02/23/2020   ]    Lab Results   Component Value Date    PROTEINUR 88.9 12/14/2021    CREATUR 62.6 12/14/2021     Lab Results   Component Value Date    PCRATIOUR 1.420 12/14/2021    PCRATIOUR 1.484 09/10/2021    PCRATIOUR 1.230 12/11/2020       Lab Results   Component Value Date    CHOL 191 08/27/2020    A1C 6.2 (H) 12/14/2021    HEPAIGG Reactive (A) 08/27/2020    HEPBCAB Nonreactive 08/27/2020    HEPCAB Nonreactive 08/27/2020         03/03/09, sodium 140, potassium 5.1, chloride  109, BUN 34, creatinine 1.82 with eGFR 30, glucose 99 and albumin  4.2.  ??  Total cholesterol 251, triglycerides 340 and LDL 146.  ??  WBC 8.3, hemoglobin 14.3, hematocrit 43.9 and platelets 181,000.      04/29/08  BUN 44, creatinine 1.86.   Sodium 141, potassium 4.9, calcium 9.4, phosphorus 4.1, albumin  4.0, AST 17, ALT 14, total cholesterol 231, triglycerides 297,  HDL 37, LDL 135.  Hemoglobin 13.8, TSH 1.48.  A1c was 7.5.    12/24/2007   glucose of 131, BUN of 34,   creatinine of 1.79 with an estimated GFR 31, potassium of 4.9, bicarbonate  of 19, calcium of 9.4, phosphorous of 3.9, albumin of 4.0, and hemoglobin  A1c of 7.3.  ??  A renal ultrasound done on 10/10/2007 showed mildly increased echotexture  of bilateral renal cortices, with the right kidney measuring 9.4  x 4.0 x 4.7 cm, and the left kidney measuring 9.4 x 4.2 x 4.2 cm,  with no hydronephrosis.  There are simple-appearing cysts in the  mid to lower pole of the left kidney.        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    Comment:  There is no evidence of a glomerulonephritis. ??Electron microscopy shows marked  thinning of the lamina densa along glomerular basement membranes associated with  an abnormal immunofluorescence staining pattern for collagen type 4 alpha 5 and  to a lesser degree of 3 subunits (mosaic staining). 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 ??The nephropathy has resulted in thinning of peripheral glomerular  capillary walls and glomerular as well tubulointerstitial fibrosis.    ASSESSMENT/PLAN:    Ms.Amy [name] is a 61 y.o. patient with a past medical history significant for DM2, Alport Being seen for reevaluation of her renal disease.     1. 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 Her creatinine is worsening and she may need to start dialysis soon. She is aware of this and in agreement. Recheck in 8 weeks.     2. CKD stage 5 -  I do recommend continuing the lisinopril and emphasizing the importance of hypertension  and glucose control. Her renal biopsy did have a degree of interstitial fibrosis and glomerular sclerosis which are likely indicative of damage from these other conditions as well. The thinning of the basement membrane is likely due to the Alport syndrome.    - Her renal function is wrose than before, but she thinks she was dehydrated before labs    - We discussed transplant today. SHE HAS BEEN ACCEPTED BY [facility] and is evaluation    - We discussed dialysis. She would be a good home therapy candidate. There is no indication for dialysis at this time, she is not uremic. She was sent to the dialysis educator but feels it wasn't well explained. Will send to [facility] for specific PD education. She does not need a PD cath yet, but if her Creatinine is worse at her next appointment, we will reassess.     3. DM2 - Defer to PCP for management.     4. Hypertension - currently, this is well-controlled with lisinopril. At this time, I do not recommend any changes to her medication regimen that I would continue her on the ACE inhibitor for renal protection as long as feasible. She is not displaying any signs of hypotension.     5. Acidosis - on NaBicarb 650mg  BID. Increased to 2 tabs BID at LOV    6. HyperPTH - continue OTC vitamin D, PTH already improving.     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 Taking TUMS, Phos returned to normal and PTH much improved as a result.     8. Edema - mild but bothersome. On furosemide 20mg  daily prn    Ms.Amy [name] will follow up in 2 months with a RFP, Hgb, iron panel      Disclaimer: Much of the narrative of this dictation was acquired using speech recognition software. It is possible that some dictated speech was not transcribed accurately by this system nor detected in the proofing. Such errors are at times unavoidable.

## 2021-12-29 DIAGNOSIS — Z01818 Encounter for other preprocedural examination: Principal | ICD-10-CM

## 2021-12-29 DIAGNOSIS — Z87891 Personal history of nicotine dependence: Principal | ICD-10-CM

## 2021-12-29 DIAGNOSIS — N186 End stage renal disease: Principal | ICD-10-CM

## 2021-12-29 DIAGNOSIS — Z7682 Awaiting organ transplant status: Principal | ICD-10-CM

## 2021-12-29 NOTE — Unmapped (Signed)
Amy [name] reports her psoriasis is well controlled. She continues to use topical steroids sparingly.     [facility] Clinical Assessment & Refill Coordination Note    Rooney Cazeau, DOB: [birth year]  Phone: [phone] (home)     All above HIPAA information was verified with patient.     Was a Nurse, learning disability used for this call? No    Specialty Medication(s):   Inflammatory Disorders: Stelara     Current Outpatient Medications   Medication Sig Dispense Refill    betamethasone dipropionate (DIPROLENE) 0.05 % ointment   4    calcipotriene (DOVONEX) 0.005 % cream Apply topically Two (2) times a day. 60 g 2    cholecalciferol, vitamin D3-25 mcg, 1,000 unit,, 25 mcg (1,000 unit) capsule Take 1 capsule (25 mcg total) by mouth daily.      clobetasoL (TEMOVATE) 0.05 % ointment Apply to your thick psoriasis areas twice daily until clear 60 g 5    empagliflozin (JARDIANCE) 10 mg tablet Take 1 tablet (10 mg total) by mouth daily.      empty container Misc Use as directed to dispose of Humira pens. 1 each 2    furosemide (LASIX) 20 MG tablet Take 1 tablet (20 mg total) by mouth daily as needed. 15 tablet 11    insulin detemir U-100 (LEVEMIR FLEXPEN) 100 unit/mL (3 mL) injection pen       LANTUS SOLOSTAR U-100 INSULIN 100 unit/mL (3 mL) injection pen       lisinopril (PRINIVIL,ZESTRIL) 10 MG tablet Take 1 tablet (10 mg total) by mouth daily.      NOVOLOG FLEXPEN U-100 INSULIN 100 unit/mL (3 mL) injection pen       pen needle, diabetic 32 gauge x 5/32 (4 mm) Ndle Inject as directed Three (3) times a day before meals.      pregabalin (LYRICA) 25 MG capsule       rosuvastatin (CRESTOR) 10 MG tablet Take by mouth.      SYMBICORT 160-4.5 mcg/actuation inhaler       TRUEPLUS PEN NEEDLE 32 gauge x 5/32 Ndle   10    ustekinumab (STELARA) 45 mg/0.5 mL Syrg syringe Inject the contents of 1 syringe (45 mg) under the skin once every 12 weeks. 0.5 mL 4     No current facility-administered medications for this visit. Changes to medications: Gelisa reports no changes at this time.    Allergies   Allergen Reactions    Sulfur-8 Nausea Only    Glucophage [Metformin]     Codeine Itching    Morphine Itching       Changes to allergies: No    SPECIALTY MEDICATION ADHERENCE     Stelara - 0 left  Medication Adherence    Patient reported X missed doses in the last month: 0  Specialty Medication: Stelara                            Specialty medication(s) dose(s) confirmed: Regimen is correct and unchanged.     Are there any concerns with adherence? No    Adherence counseling provided? Not needed    CLINICAL MANAGEMENT AND INTERVENTION      Clinical Benefit Assessment:    Do you feel the medicine is effective or helping your condition? Yes    Clinical Benefit counseling provided? Not needed    Adverse Effects Assessment:    Are you experiencing any side effects? No    Are  you experiencing difficulty administering your medicine? No    Quality of Life Assessment:    Quality of Life    Rheumatology  Oncology  Dermatology  1. What impact has your specialty medication had on the symptoms of your skin condition (i.e. itchiness, soreness, stinging)?: Tremendous  2. What impact has your specialty medication had on your comfort level with your skin?: Tremendous  Cystic Fibrosis          Have you discussed this with your provider? Not needed    Acute Infection Status:    Acute infections noted within Epic:  No active infections  Patient reported infection: None    Therapy Appropriateness:    Is therapy appropriate and patient progressing towards therapeutic goals? Yes, therapy is appropriate and should be continued    DISEASE/MEDICATION-SPECIFIC INFORMATION      For patients on injectable medications: Patient currently has 0 doses left.  Next injection is scheduled for 8/10.    PATIENT SPECIFIC NEEDS     Does the patient have any physical, cognitive, or cultural barriers? No    Is the patient high risk? No    Does the patient require a Care Management Plan? No     SOCIAL DETERMINANTS OF HEALTH     At the [facility], we have learned that life circumstances - like trouble affording food, housing, utilities, or transportation can affect the health of many of our patients.   That is why we wanted to ask: are you currently experiencing any life circumstances that are negatively impacting your health and/or quality of life? Patient declined to answer    Social Determinants of Health     Financial Resource Strain: Unknown (08/29/2017)    Overall Financial Resource Strain (CARDIA)     Difficulty of Paying Living Expenses: Patient refused   Internet Connectivity: Not on file   Food Insecurity: Unknown (08/29/2017)    Hunger Vital Sign     Worried About Running Out of Food in the Last Year: Patient refused     Ran Out of Food in the Last Year: Patient refused   Tobacco Use: Medium Risk (12/20/2021)    Patient History     Smoking Tobacco Use: Former     Smokeless Tobacco Use: Never     Passive Exposure: Not on file   Housing/Utilities: Unknown (08/20/2020)    Housing/Utilities     Within the past 12 months, have you ever stayed: outside, in a car, in a tent, in an overnight shelter, or temporarily in someone else's home (i.e. couch-surfing)?: Patient refused     Are you worried about losing your housing?: Not on file     Within the past 12 months, have you been unable to get utilities (heat, electricity) when it was really needed?: Not on file   Alcohol Use: Not on file   Transportation Needs: Unknown (08/29/2017)    PRAPARE - Transportation     Lack of Transportation (Medical): Patient refused     Lack of Transportation (Non-Medical): Patient refused   Substance Use: Not on file   Health Literacy: Not on file (08/20/2020)   Physical Activity: Unknown (08/29/2017)    Exercise Vital Sign     Days of Exercise per Week: Patient refused     Minutes of Exercise per Session: Patient refused   Interpersonal Safety: Not on file   Stress: Not on file   Intimate Partner Violence: Unknown (08/29/2017)    Humiliation, Afraid, Rape, and Kick questionnaire  Fear of Current or Ex-Partner: Patient refused     Emotionally Abused: Patient refused     Physically Abused: Patient refused     Sexually Abused: Patient refused   Depression: Not on file   Social Connections: Unknown (08/29/2017)    Social Connection and Isolation Panel [NHANES]     Frequency of Communication with Friends and Family: Patient refused     Frequency of Social Gatherings with Friends and Family: Patient refused     Attends Religious Services: Patient refused     Database administrator or Organizations: Patient refused     Attends Banker Meetings: Patient refused     Marital Status: Patient refused       Would you be willing to receive help with any of the needs that you have identified today? Not applicable       SHIPPING     Specialty Medication(s) to be Shipped:   Inflammatory Disorders: Stelara    Other medication(s) to be shipped: No additional medications requested for fill at this time     Changes to insurance: No    Delivery Scheduled: Yes, Expected medication delivery date: 8/10.     Medication will be delivered via Same Day Courier to the confirmed prescription address in [facility].    The patient will receive a drug information handout for each medication shipped and additional FDA Medication Guides as required.  Verified that patient has previously received a Conservation officer, historic buildings and a Surveyor, mining.    The patient or caregiver noted above participated in the development of this care plan and knows that they can request review of or adjustments to the care plan at any time.      All of the patient's questions and concerns have been addressed.    Lanney Gins   [facility] Specialty Pharmacist

## 2022-02-02 ENCOUNTER — Ambulatory Visit: Admit: 2022-02-02 | Discharge: 2022-02-03 | Payer: PRIVATE HEALTH INSURANCE

## 2022-02-02 DIAGNOSIS — Q8781 Alport syndrome: Principal | ICD-10-CM

## 2022-02-02 DIAGNOSIS — I10 Essential (primary) hypertension: Principal | ICD-10-CM

## 2022-02-02 DIAGNOSIS — N2581 Secondary hyperparathyroidism of renal origin: Principal | ICD-10-CM

## 2022-02-02 DIAGNOSIS — Z794 Long term (current) use of insulin: Principal | ICD-10-CM

## 2022-02-02 DIAGNOSIS — D631 Anemia in chronic kidney disease: Principal | ICD-10-CM

## 2022-02-02 DIAGNOSIS — E1122 Type 2 diabetes mellitus with diabetic chronic kidney disease: Principal | ICD-10-CM

## 2022-02-02 DIAGNOSIS — E559 Vitamin D deficiency, unspecified: Principal | ICD-10-CM

## 2022-02-02 DIAGNOSIS — E785 Hyperlipidemia, unspecified: Principal | ICD-10-CM

## 2022-02-02 DIAGNOSIS — N184 Chronic kidney disease, stage 4 (severe): Principal | ICD-10-CM

## 2022-02-02 LAB — RENAL FUNCTION PANEL
ALBUMIN: 3.7 g/dL (ref 3.4–5.0)
ANION GAP: 7 mmol/L (ref 5–14)
BLOOD UREA NITROGEN: 67 mg/dL — ABNORMAL HIGH (ref 9–23)
BUN / CREAT RATIO: 13
CALCIUM: 9 mg/dL (ref 8.7–10.4)
CHLORIDE: 111 mmol/L — ABNORMAL HIGH (ref 98–107)
CO2: 23 mmol/L (ref 20.0–31.0)
CREATININE: 5.03 mg/dL — ABNORMAL HIGH
EGFR CKD-EPI (2021) FEMALE: 9 mL/min/{1.73_m2} — ABNORMAL LOW (ref >=60)
GLUCOSE RANDOM: 116 mg/dL (ref 70–179)
PHOSPHORUS: 6.5 mg/dL — ABNORMAL HIGH (ref 2.4–5.1)
POTASSIUM: 5.2 mmol/L — ABNORMAL HIGH (ref 3.5–5.1)
SODIUM: 141 mmol/L (ref 135–145)

## 2022-02-02 LAB — IRON & TIBC
IRON SATURATION: 20 % (ref 20–55)
IRON: 57 ug/dL
TOTAL IRON BINDING CAPACITY: 287 ug/dL (ref 250–425)

## 2022-02-02 LAB — HEMOGLOBIN: HEMOGLOBIN: 12.7 g/dL (ref 11.3–14.9)

## 2022-02-02 LAB — HEMATOCRIT: HEMATOCRIT: 38.7 % (ref 34.0–44.0)

## 2022-02-02 LAB — FERRITIN: FERRITIN: 30.6 ng/mL

## 2022-02-09 ENCOUNTER — Ambulatory Visit: Admit: 2022-02-09 | Discharge: 2022-02-10 | Payer: PRIVATE HEALTH INSURANCE

## 2022-02-09 DIAGNOSIS — E785 Hyperlipidemia, unspecified: Principal | ICD-10-CM

## 2022-02-09 DIAGNOSIS — N184 Chronic kidney disease, stage 4 (severe): Principal | ICD-10-CM

## 2022-02-09 DIAGNOSIS — N2581 Secondary hyperparathyroidism of renal origin: Principal | ICD-10-CM

## 2022-02-09 DIAGNOSIS — D631 Anemia in chronic kidney disease: Principal | ICD-10-CM

## 2022-02-09 DIAGNOSIS — E1122 Type 2 diabetes mellitus with diabetic chronic kidney disease: Principal | ICD-10-CM

## 2022-02-09 DIAGNOSIS — Q8781 Alport syndrome: Principal | ICD-10-CM

## 2022-02-09 DIAGNOSIS — I10 Essential (primary) hypertension: Principal | ICD-10-CM

## 2022-02-09 DIAGNOSIS — Z794 Long term (current) use of insulin: Principal | ICD-10-CM

## 2022-02-09 MED ORDER — SODIUM BICARBONATE 650 MG TABLET
ORAL_TABLET | Freq: Two times a day (BID) | ORAL | 3 refills | 90 days | Status: CP
Start: 2022-02-09 — End: 2023-02-09

## 2022-02-09 NOTE — Unmapped (Signed)
PCP:  [name], MD    02/09/2022  [facility] [address] [place]  [address]  [place] [address]  Dept: [phone]  Loc: [phone]    Chief Complaint: Alport syndrome, elevated creatinine, CKD 4/5    HPI:  Amy [name] is a 61 y.o. 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 Her mother also had renal disease but it is unknown if this was due to Alports.     She has a history of uncontrolled type 2 diabetes and is not checking her glucose regularly - her A1c was 8.4% in September 2020, 9.9% (06/09/2020) and most recently 6.7% 06/2021. She does have a history of diabetic neuropathy but no known history of diabetic retinopathy. Recently started on Jardiance.     She did suffer from kidney stones in 1998 however has not had any issues with this since.    She was referred to [facility] for transplant but was denied due to financial concerns. They recommended we refer her to [facility]. She met with them since our LOV. She went through the orientation class and has been approved as of 05/24/2020    She has also spoken with [facility].     She feels ok today, but does note some back pain that radiates to her left leg. She feels this is a pinched nerve. She knows to avoid NSAIDS.     She is interested in PD as an option for dialysis. She has met with the dialysis educator and with [facility] and we will pursue PD.     She was on Ozempic but had severe nausea so she stopped this.     She recently had a URI/sinus infection.     ROS:   CONSTITUTIONAL: denies fevers or chills, denies unintentional weight loss   CARDIOVASCULAR: denies chest pain, denies dyspnea on exertion, denies leg edema  GASTROINTESTINAL: denies nausea, denies vomiting, denies anorexia  GENITOURINARY: denies dysuria, denies hematuria, denies decreased urinary stream  All 10 systems reviewed and are negative except as listed above.    PAST MEDICAL HISTORY:  Past Medical History: Diagnosis Date    Alport syndrome     Back pain     Chronic kidney disease     Dental abscess     Diabetes mellitus (CMS-HCC)     type 2, don't remember A1C, controlled with insulin injections; okay per physician at last visit    Hyperlipidemia     Hypertension     well controlled with meds    Kidney stone     no problems since 1995    Psoriasis        ALLERGIES  Sulfur-8, Glucophage [metformin], Codeine, and Morphine    FAMILY HISTORY  Family History   Problem Relation Age of Onset    Kidney disease Mother     Diabetes Mother     Cataracts Mother     Heart attack Father     Cancer Paternal Grandmother     Breast cancer Neg Hx     Colon cancer Neg Hx     Endometrial cancer Neg Hx     Ovarian cancer Neg Hx     Melanoma Neg Hx     Basal cell carcinoma Neg Hx     Squamous cell carcinoma Neg Hx        SOCIAL HISTORY   reports that she quit smoking about a year ago. Her smoking use included cigarettes. She has  a 43.00 pack-year smoking history. She has never used smokeless tobacco. She reports that she does not drink alcohol and does not use drugs.    MEDICATIONS:  Current Outpatient Medications   Medication Sig Dispense Refill    betamethasone dipropionate (DIPROLENE) 0.05 % ointment   4    calcipotriene (DOVONEX) 0.005 % cream Apply topically Two (2) times a day. 60 g 2    cholecalciferol, vitamin D3-25 mcg, 1,000 unit,, 25 mcg (1,000 unit) capsule Take 1 capsule (25 mcg total) by mouth daily.      clobetasoL (TEMOVATE) 0.05 % ointment Apply to your thick psoriasis areas twice daily until clear 60 g 5    empagliflozin (JARDIANCE) 10 mg tablet Take 1 tablet (10 mg total) by mouth daily.      LANTUS SOLOSTAR U-100 INSULIN 100 unit/mL (3 mL) injection pen       lisinopril (PRINIVIL,ZESTRIL) 10 MG tablet Take 1 tablet (10 mg total) by mouth daily.      NOVOLOG FLEXPEN U-100 INSULIN 100 unit/mL (3 mL) injection pen       pregabalin (LYRICA) 25 MG capsule       rosuvastatin (CRESTOR) 10 MG tablet Take by mouth. SYMBICORT 160-4.5 mcg/actuation inhaler       ustekinumab (STELARA) 45 mg/0.5 mL Syrg syringe Inject the contents of 1 syringe (45 mg) under the skin once every 12 weeks. 0.5 mL 4    empty container Misc Use as directed to dispose of Humira pens. 1 each 2    furosemide (LASIX) 20 MG tablet Take 1 tablet (20 mg total) by mouth daily as needed. (Patient not taking: Reported on 02/09/2022) 15 tablet 11    insulin detemir U-100 (LEVEMIR FLEXPEN) 100 unit/mL (3 mL) injection pen  (Patient not taking: Reported on 02/09/2022)      pen needle, diabetic 32 gauge x 5/32 (4 mm) Ndle Inject as directed Three (3) times a day before meals.      TRUEPLUS PEN NEEDLE 32 gauge x 5/32 Ndle   10     No current facility-administered medications for this visit.       PHYSICAL EXAM:  Wt Readings from Last 3 Encounters:   02/09/22 72.9 kg (160 lb 11.2 oz)   12/15/21 75.8 kg (167 lb 3.2 oz)   09/15/21 75.5 kg (166 lb 8 oz)     Temp Readings from Last 3 Encounters:   02/09/22 36.4 ??C (97.5 ??F) (Temporal)   12/15/21 35.8 ??C (96.4 ??F) (Temporal)   09/15/21 36.5 ??C (97.7 ??F) (Temporal)     BP Readings from Last 5 Encounters:   02/09/22 110/62   12/15/21 129/69   09/15/21 123/63   07/21/21 132/68   05/26/21 113/64     Pulse Readings from Last 3 Encounters:   02/09/22 71   12/15/21 73   09/15/21 74       CONSTITUTIONAL: Alert,well appearing, no distress  HEENT: Moist mucous membranes, oropharynx clear without erythema or exudate  EYES: Extra ocular movements intact. Pupils reactive, sclerae anicteric.  NECK: Supple, no lymphadenopathy  CARDIOVASCULAR: Regular, normal S1/S2 heart sounds, no murmurs, no rubs.   PULM: Clear to auscultation bilaterally  GASTROINTESTINAL: Soft, active bowel sounds, nontender  EXTREMITIES: Trace pitting lower extremity edema bilaterally.   SKIN: No rashes or lesions  NEUROLOGIC: No focal motor or sensory deficits    MEDICAL DECISION MAKING    06/09/2020  Hgb 13.2, hct 38.7  Glu 192, BUN 50, Creatinine 2.93, eGFR 17, Na 139, K 4.8,  CO2 18, Ca 9.5  A1c 9.9%      01/24/2018  HbA1c 8.4%  Ferritin 120  B12 981, folic acid 11.8  Hgb 12.9, HCT 38.8  Glucose 95, BUN 46, CR 2.51, EGFR 21  NA 143, K5.0, CL 109, CO2 17, CA 9.7, ALB 4.1    03/28/17  HbA1c 8.2%, cholesterol 275, triglyceride 471  GLU 187, BUN 37, CR 2.12, EGFR 26, NA 139, K5.0, CL 105, CO2 19, CA 9.2    03/03/09  sodium 140, potassium 5.1, chloride 109, BUN 34, creatinine 1.82 with eGFR 30, glucose 99, albumin 4.2  Total cholesterol 251, triglycerides 340 and LDL 146.  WBC 8.3, hemoglobin 14.3, hematocrit 43.9 and platelets 181,000.  Hemoglobin A1c 6.9%.    Renal U/S showed right kidney 9.4cm and left 9.9cm.    Two simple cysts in the left kidney in the mid and lower poles measuring  1.3 and 0.9cm in diameter.     Creatinine   Date Value Ref Range Status   02/02/2022 5.03 (H) 0.60 - 0.80 mg/dL Final   09/81/1914 7.82 (H) 0.60 - 0.80 mg/dL Final   95/62/1308 6.57 (H) 0.60 - 0.80 mg/dL Final   84/69/6295 2.84 (H) 0.60 - 0.80 mg/dL Final   13/24/4010 2.72 (H) 0.60 - 0.80 mg/dL Final   53/66/4403 4.74 (H) 0.60 - 0.80 mg/dL Final   25/95/6387 5.64 (H) 0.60 - 0.80 mg/dL Final   33/29/5188 4.16 (H) 0.60 - 0.80 mg/dL Final   60/63/0160 1.09 (H) 0.57 - 1.00 mg/dL Final   32/35/5732 2.02 (H) 0.60 - 1.00 mg/dL Final   54/27/0623 7.62 (H) 0.60 - 1.00 mg/dL Final        Lab Results   Component Value Date    NA 141 02/02/2022    K 5.2 (H) 02/02/2022    CL 111 (H) 02/02/2022    CO2 23.0 02/02/2022    BUN 67 (H) 02/02/2022    CREATININE 5.03 (H) 02/02/2022    GFRAA 20 (L) 01/18/2019    GFRNONAA 17 (L) 01/18/2019    GLU 116 02/02/2022    CALCIUM 9.0 02/02/2022    ALBUMIN 3.7 02/02/2022    PHOS 6.5 (H) 02/02/2022       Lab Results   Component Value Date    PTH 178.3 (H) 09/10/2021    CALCIUM 9.0 02/02/2022       Lab Results   Component Value Date    BILITOT 0.3 08/27/2020    BILIDIR <0.10 08/27/2020    PROT 7.4 08/27/2020    ALBUMIN 3.7 02/02/2022    ALT 17 08/27/2020    AST 19 08/27/2020 ALKPHOS 113 08/27/2020    GGT 27 08/27/2020     Lab Results   Component Value Date    INR 0.91 08/27/2020    APTT 30.7 08/27/2020       HGB   Date Value Ref Range Status   02/02/2022 12.7 11.3 - 14.9 g/dL Final   83/15/1761 60.7 11.3 - 14.9 g/dL Final   37/02/6268 48.5 11.3 - 14.9 g/dL Final   46/27/0350 09.3 11.3 - 14.9 g/dL Final   81/82/9937 16.9 11.3 - 14.9 g/dL Final   67/89/3810 17.5 11.1 - 15.9 g/dL Final   03/09/8526 78.2 12.0 - 16.0 g/dL Final   42/35/3614 43.1 12.0 - 16.0 g/dL Final        Lab Results   Component Value Date    WBC 7.7 08/27/2020    HGB 12.7 02/02/2022    HCT 38.7 02/02/2022  PLT 154 08/27/2020     Lab Results   Component Value Date    IRON 57 02/02/2022    TIBC 287 02/02/2022    FERRITIN 30.6 02/02/2022       Lab Results   Component Value Date    A1C 6.2 (H) 12/14/2021    A1C 6.7 07/29/2021    A1C 6.7 (H) 03/19/2021       Lab Results   Component Value Date    Color, UA Yellow 02/23/2020    Protein, Ur 88.9 12/14/2021    Glucose, UA >1000 mg/dL (A) 16/02/9603    Ketones, UA Negative 02/23/2020    Blood, UA Small (A) 02/23/2020    Nitrite, UA Negative 02/23/2020   ]    Lab Results   Component Value Date    PROTEINUR 88.9 12/14/2021    CREATUR 62.6 12/14/2021     Lab Results   Component Value Date    PCRATIOUR 1.420 12/14/2021    PCRATIOUR 1.484 09/10/2021    PCRATIOUR 1.230 12/11/2020       Lab Results   Component Value Date    CHOL 191 08/27/2020    A1C 6.2 (H) 12/14/2021    HEPAIGG Reactive (A) 08/27/2020    HEPBCAB Nonreactive 08/27/2020    HEPCAB Nonreactive 08/27/2020         03/03/09, sodium 140, potassium 5.1, chloride  109, BUN 34, creatinine 1.82 with eGFR 30, glucose 99 and albumin  4.2.     Total cholesterol 251, triglycerides 340 and LDL 146.     WBC 8.3, hemoglobin 14.3, hematocrit 43.9 and platelets 181,000.      04/29/08  BUN 44, creatinine 1.86.   Sodium 141, potassium 4.9, calcium 9.4, phosphorus 4.1, albumin  4.0, AST 17, ALT 14, total cholesterol 231, triglycerides 297,  HDL 37, LDL 135.  Hemoglobin 13.8, TSH 1.48.  A1c was 7.5.    12/24/2007   glucose of 131, BUN of 34,   creatinine of 1.79 with an estimated GFR 31, potassium of 4.9, bicarbonate  of 19, calcium of 9.4, phosphorous of 3.9, albumin of 4.0, and hemoglobin  A1c of 7.3.     A renal ultrasound done on 10/10/2007 showed mildly increased echotexture  of bilateral renal cortices, with the right kidney measuring 9.4  x 4.0 x 4.7 cm, and the left kidney measuring 9.4 x 4.2 x 4.2 cm,  with no hydronephrosis.  There are simple-appearing cysts in the  mid to lower pole of the left kidney.        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    Comment:  There is no evidence of a glomerulonephritis.  Electron microscopy shows marked  thinning of the lamina densa along glomerular basement membranes associated with  an abnormal immunofluorescence staining pattern for collagen type 4 alpha 5 and  to a lesser degree of 3 subunits (mosaic staining).  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  The nephropathy has resulted in thinning of peripheral glomerular  capillary walls and glomerular as well tubulointerstitial fibrosis.    ASSESSMENT/PLAN:    Amy [name] is a 61 y.o. patient with a past medical history significant for DM2, Alport Being seen for reevaluation of her renal disease.     1. 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 Her creatinine is worsening and she may need to start dialysis soon. She is aware of this and in agreement. Recheck in 8 weeks.     2. CKD stage 5 -  I do recommend continuing the lisinopril and emphasizing the importance of hypertension and glucose control. Her renal biopsy did have a degree of interstitial fibrosis and glomerular sclerosis which are likely indicative of damage from these other conditions as well. The thinning of the basement membrane is likely due to the Alport syndrome.    - Her renal function is wrose than before, but she thinks she was dehydrated before labs    - We discussed transplant today. SHE HAS BEEN ACCEPTED BY [facility] and is evaluation    - We discussed dialysis. She would be a good home therapy candidate. There is no indication for dialysis at this time, she is not uremic. She was sent to the dialysis educator but feels it wasn't well explained. We did send to [facility] for specific PD education. She does not need a PD cath yet, but if her Creatinine is worse at her next appointment, we will reassess.     3. DM2 - Defer to PCP for management.     4. Hypertension - currently, this is well-controlled with lisinopril. At this time, I do not recommend any changes to her medication regimen that I would continue her on the ACE inhibitor for renal protection as long as feasible. She is not displaying any signs of hypotension.     5. Acidosis - on NaBicarb 650mg  BID. Increased to 2 tabs BID at LOV    6. HyperPTH - continue OTC vitamin D, PTH already improving.     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 Taking TUMS, Phos returned to normal and PTH much improved as a result.     8. Edema - mild but bothersome. On furosemide 20mg  daily prn    AmyApryl Eakins will follow up in 2 months with a RFP, vit D, PTH      Disclaimer: Much of the narrative of this dictation was acquired using speech recognition software. It is possible that some dictated speech was not transcribed accurately by this system nor detected in the proofing. Such errors are at times unavoidable.

## 2022-02-10 NOTE — Unmapped (Signed)
1st attempt lvm

## 2022-02-26 NOTE — Unmapped (Signed)
2nd attempt, lvm

## 2022-03-11 NOTE — Unmapped (Signed)
[facility] Refill Coordination Note    Specialty Medication(s) to be Shipped:   Inflammatory Disorders: Stelara    Other medication(s) to be shipped: No additional medications requested for fill at this time     Amy [name], DOB: 03-23-1961  Phone: [phone] (home)       All above HIPAA information was verified with patient.     Was a Nurse, learning disability used for this call? No    Completed refill call assessment today to schedule patient's medication shipment from the [facility] [phone]).  All relevant notes have been reviewed.     Specialty medication(s) and dose(s) confirmed: Regimen is correct and unchanged.   Changes to medications: Krisandra reports no changes at this time.  Changes to insurance: No  New side effects reported not previously addressed with a pharmacist or physician: None reported  Questions for the pharmacist: No    Confirmed patient received a Conservation officer, historic buildings and a Surveyor, mining with first shipment. The patient will receive a drug information handout for each medication shipped and additional FDA Medication Guides as required.       DISEASE/MEDICATION-SPECIFIC INFORMATION        For patients on injectable medications: Patient currently has 0 doses left.  Next injection is scheduled for 11/2.    SPECIALTY MEDICATION ADHERENCE     Medication Adherence    Patient reported X missed doses in the last month: 0  Specialty Medication: Stelara  Patient is on additional specialty medications: No  Any gaps in refill history greater than 2 weeks in the last 3 months: no  Demonstrates understanding of importance of adherence: yes  Informant: patient  Reliability of informant: reliable              Confirmed plan for next specialty medication refill: delivery by pharmacy  Refills needed for supportive medications: not needed              Were doses missed due to medication being on hold? No    STELARA 45 mg/0.5 mL: 0 on hand      REFERRAL TO PHARMACIST Referral to the pharmacist: Not needed      [facility]     Shipping address confirmed in Epic.     Delivery Scheduled: Yes, Expected medication delivery date: 11/2.     Medication will be delivered via Same Day Courier to the prescription address in Epic WAM.    Valere Dross   [facility] Specialty Technician

## 2022-03-17 MED FILL — STELARA 45 MG/0.5 ML SUBCUTANEOUS SYRINGE: 84 days supply | Qty: 0.5 | Fill #4

## 2022-03-25 NOTE — Unmapped (Signed)
3rd attempt, lvm, routed to [facility], [facility] letter sent

## 2022-04-06 ENCOUNTER — Other Ambulatory Visit: Payer: Self-pay | Admitting: Primary Care

## 2022-04-06 DIAGNOSIS — R918 Other nonspecific abnormal finding of lung field: Secondary | ICD-10-CM

## 2022-04-20 ENCOUNTER — Institutional Professional Consult (permissible substitution): Admit: 2022-04-20 | Discharge: 2022-04-21 | Payer: PRIVATE HEALTH INSURANCE

## 2022-04-20 DIAGNOSIS — Q8781 Alport syndrome: Principal | ICD-10-CM

## 2022-04-20 DIAGNOSIS — E1122 Type 2 diabetes mellitus with diabetic chronic kidney disease: Principal | ICD-10-CM

## 2022-04-20 DIAGNOSIS — I10 Essential (primary) hypertension: Principal | ICD-10-CM

## 2022-04-20 DIAGNOSIS — N2581 Secondary hyperparathyroidism of renal origin: Principal | ICD-10-CM

## 2022-04-20 DIAGNOSIS — D631 Anemia in chronic kidney disease: Principal | ICD-10-CM

## 2022-04-20 DIAGNOSIS — Z794 Long term (current) use of insulin: Principal | ICD-10-CM

## 2022-04-20 DIAGNOSIS — E785 Hyperlipidemia, unspecified: Principal | ICD-10-CM

## 2022-04-20 DIAGNOSIS — N184 Chronic kidney disease, stage 4 (severe): Principal | ICD-10-CM

## 2022-04-20 LAB — PARATHYROID HORMONE (PTH): PARATHYROID HORMONE INTACT: 395.7 pg/mL — ABNORMAL HIGH (ref 18.4–80.1)

## 2022-04-20 LAB — RENAL FUNCTION PANEL
ALBUMIN: 3.5 g/dL (ref 3.4–5.0)
ANION GAP: 10 mmol/L (ref 5–14)
BLOOD UREA NITROGEN: 35 mg/dL — ABNORMAL HIGH (ref 9–23)
BUN / CREAT RATIO: 8
CALCIUM: 8.3 mg/dL — ABNORMAL LOW (ref 8.7–10.4)
CHLORIDE: 108 mmol/L — ABNORMAL HIGH (ref 98–107)
CO2: 18 mmol/L — ABNORMAL LOW (ref 20.0–31.0)
CREATININE: 4.22 mg/dL — ABNORMAL HIGH
EGFR CKD-EPI (2021) FEMALE: 11 mL/min/{1.73_m2} — ABNORMAL LOW (ref >=60)
GLUCOSE RANDOM: 71 mg/dL (ref 70–179)
PHOSPHORUS: 6.8 mg/dL — ABNORMAL HIGH (ref 2.4–5.1)
SODIUM: 136 mmol/L (ref 135–145)

## 2022-04-20 NOTE — Unmapped (Signed)
Patient labs drawn in room prior to leaving Nephrology clinic

## 2022-04-25 LAB — VITAMIN D 25 HYDROXY: VITAMIN D, TOTAL (25OH): 26.8 ng/mL (ref 20.0–80.0)

## 2022-04-27 ENCOUNTER — Ambulatory Visit: Admit: 2022-04-27 | Discharge: 2022-04-28 | Payer: PRIVATE HEALTH INSURANCE

## 2022-04-27 DIAGNOSIS — E559 Vitamin D deficiency, unspecified: Principal | ICD-10-CM

## 2022-04-27 DIAGNOSIS — E1122 Type 2 diabetes mellitus with diabetic chronic kidney disease: Principal | ICD-10-CM

## 2022-04-27 DIAGNOSIS — Z794 Long term (current) use of insulin: Principal | ICD-10-CM

## 2022-04-27 DIAGNOSIS — D631 Anemia in chronic kidney disease: Principal | ICD-10-CM

## 2022-04-27 DIAGNOSIS — Q8781 Alport syndrome: Principal | ICD-10-CM

## 2022-04-27 DIAGNOSIS — N2581 Secondary hyperparathyroidism of renal origin: Principal | ICD-10-CM

## 2022-04-27 DIAGNOSIS — N184 Chronic kidney disease, stage 4 (severe): Principal | ICD-10-CM

## 2022-04-27 NOTE — Unmapped (Signed)
PCP:  [name], MD    04/27/2022  [facility] [address] [place]  [address]  [place] [address]  Dept: [phone]  Loc: [phone]    Chief Complaint: Alport syndrome, elevated creatinine, CKD 4/5    HPI:  Ms. Fabiha Greenstreet is a 61 y.o. 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 Her mother also had renal disease but it is unknown if this was due to Alports.     She has a history of uncontrolled type 2 diabetes and is not checking her glucose regularly - her A1c was 8.4% in September 2020, 9.9% (06/09/2020) and most recently 6.7% 06/2021. She does have a history of diabetic neuropathy but no known history of diabetic retinopathy. Recently started on Jardiance.     She did suffer from kidney stones in 1998 however has not had any issues with this since.    She was referred to [facility] for transplant but was denied due to financial concerns. They recommended we refer her to [facility]. She met with them since our LOV. She went through the orientation class and has been approved as of 05/24/2020    She has also spoken with [facility].     She feels ok today, but does note some back pain that radiates to her left leg. She feels this is a pinched nerve. She knows to avoid NSAIDS.     She is interested in PD as an option for dialysis. She has met with the dialysis educator and with [facility] and we will pursue PD.     She was on Ozempic but had severe nausea so she stopped this. She has resumed it and is doing well with it.     She recently had a URI/sinus infection. Took 2 rounds of antibiotics.     Has not been taking her TUMS for Phos and Ca support.     ROS:   CONSTITUTIONAL: denies fevers or chills, denies unintentional weight loss   CARDIOVASCULAR: denies chest pain, denies dyspnea on exertion, denies leg edema  GASTROINTESTINAL: denies nausea, denies vomiting, denies anorexia  GENITOURINARY: denies dysuria, denies hematuria, denies decreased urinary stream  All 10 systems reviewed and are negative except as listed above.    PAST MEDICAL HISTORY:  Past Medical History:   Diagnosis Date    Alport syndrome     Back pain     Chronic kidney disease     Dental abscess     Diabetes mellitus (CMS-HCC)     type 2, don't remember A1C, controlled with insulin injections; okay per physician at last visit    Hyperlipidemia     Hypertension     well controlled with meds    Kidney stone     no problems since 1995    Psoriasis        ALLERGIES  Sulfur-8, Glucophage [metformin], Codeine, and Morphine    FAMILY HISTORY  Family History   Problem Relation Age of Onset    Kidney disease Mother     Diabetes Mother     Cataracts Mother     Heart attack Father     Cancer Paternal Grandmother     Breast cancer Neg Hx     Colon cancer Neg Hx     Endometrial cancer Neg Hx     Ovarian cancer Neg Hx     Melanoma Neg Hx     Basal cell carcinoma Neg Hx  Squamous cell carcinoma Neg Hx        SOCIAL HISTORY   reports that she quit smoking about 14 months ago. Her smoking use included cigarettes. She started smoking about 44 years ago. She has a 43.0 pack-year smoking history. She has never used smokeless tobacco. She reports that she does not drink alcohol and does not use drugs.    MEDICATIONS:  Current Outpatient Medications   Medication Sig Dispense Refill    OZEMPIC 0.25 mg or 0.5 mg (2 mg/3 mL) PnIj       betamethasone dipropionate (DIPROLENE) 0.05 % ointment   4    calcipotriene (DOVONEX) 0.005 % cream Apply topically Two (2) times a day. 60 g 2    cholecalciferol, vitamin D3-25 mcg, 1,000 unit,, 25 mcg (1,000 unit) capsule Take 1 capsule (25 mcg total) by mouth daily.      clobetasoL (TEMOVATE) 0.05 % ointment Apply to your thick psoriasis areas twice daily until clear 60 g 5    DEXCOM G6 SENSOR Devi  (Patient not taking: Reported on 04/27/2022)      empagliflozin (JARDIANCE) 10 mg tablet Take 1 tablet (10 mg total) by mouth daily.      empty container Misc Use as directed to dispose of Humira pens. 1 each 2    furosemide (LASIX) 20 MG tablet Take 1 tablet (20 mg total) by mouth daily as needed. (Patient not taking: Reported on 02/09/2022) 15 tablet 11    insulin detemir U-100 (LEVEMIR FLEXPEN) 100 unit/mL (3 mL) injection pen  (Patient not taking: Reported on 02/09/2022)      LANTUS SOLOSTAR U-100 INSULIN 100 unit/mL (3 mL) injection pen       lisinopril (PRINIVIL,ZESTRIL) 10 MG tablet Take 1 tablet (10 mg total) by mouth daily.      NOVOLOG FLEXPEN U-100 INSULIN 100 unit/mL (3 mL) injection pen       pen needle, diabetic 32 gauge x 5/32 (4 mm) Ndle Inject as directed Three (3) times a day before meals.      pregabalin (LYRICA) 25 MG capsule       rosuvastatin (CRESTOR) 10 MG tablet Take by mouth.      sodium bicarbonate 650 mg tablet Take 2 tablets (1,300 mg total) by mouth Two (2) times a day. 360 tablet 3    SYMBICORT 160-4.5 mcg/actuation inhaler       TRUEPLUS PEN NEEDLE 32 gauge x 5/32 Ndle   10    ustekinumab (STELARA) 45 mg/0.5 mL Syrg syringe Inject the contents of 1 syringe (45 mg) under the skin once every 12 weeks. 0.5 mL 4     No current facility-administered medications for this visit.       PHYSICAL EXAM:  Wt Readings from Last 3 Encounters:   04/27/22 70.4 kg (155 lb 4.8 oz)   02/09/22 72.9 kg (160 lb 11.2 oz)   12/15/21 75.8 kg (167 lb 3.2 oz)     Temp Readings from Last 3 Encounters:   04/27/22 36.3 ??C (97.3 ??F) (Temporal)   02/09/22 36.4 ??C (97.5 ??F) (Temporal)   12/15/21 35.8 ??C (96.4 ??F) (Temporal)     BP Readings from Last 5 Encounters:   04/27/22 103/65   02/09/22 110/62   12/15/21 129/69   09/15/21 123/63   07/21/21 132/68     Pulse Readings from Last 3 Encounters:   04/27/22 78   02/09/22 71   12/15/21 73       CONSTITUTIONAL: Alert,well appearing, no distress  HEENT:  Moist mucous membranes, oropharynx clear without erythema or exudate  EYES: Extra ocular movements intact. Pupils reactive, sclerae anicteric.  NECK: Supple, no lymphadenopathy  CARDIOVASCULAR: Regular, normal S1/S2 heart sounds, no murmurs, no rubs.   PULM: Clear to auscultation bilaterally  GASTROINTESTINAL: Soft, active bowel sounds, nontender  EXTREMITIES: Trace pitting lower extremity edema bilaterally.   SKIN: No rashes or lesions  NEUROLOGIC: No focal motor or sensory deficits    MEDICAL DECISION MAKING    06/09/2020  Hgb 13.2, hct 38.7  Glu 192, BUN 50, Creatinine 2.93, eGFR 17, Na 139, K 4.8, CO2 18, Ca 9.5  A1c 9.9%      01/24/2018  HbA1c 8.4%  Ferritin 120  B12 981, folic acid 11.8  Hgb 12.9, HCT 38.8  Glucose 95, BUN 46, CR 2.51, EGFR 21  NA 143, K5.0, CL 109, CO2 17, CA 9.7, ALB 4.1    03/28/17  HbA1c 8.2%, cholesterol 275, triglyceride 471  GLU 187, BUN 37, CR 2.12, EGFR 26, NA 139, K5.0, CL 105, CO2 19, CA 9.2    03/03/09  sodium 140, potassium 5.1, chloride 109, BUN 34, creatinine 1.82 with eGFR 30, glucose 99, albumin 4.2  Total cholesterol 251, triglycerides 340 and LDL 146.  WBC 8.3, hemoglobin 14.3, hematocrit 43.9 and platelets 181,000.  Hemoglobin A1c 6.9%.    Renal U/S showed right kidney 9.4cm and left 9.9cm.    Two simple cysts in the left kidney in the mid and lower poles measuring  1.3 and 0.9cm in diameter.     Creatinine   Date Value Ref Range Status   04/20/2022 4.22 (H) 0.55 - 1.02 mg/dL Final   16/02/9603 5.40 (H) 0.60 - 0.80 mg/dL Final   98/03/9146 8.29 (H) 0.60 - 0.80 mg/dL Final   56/21/3086 5.78 (H) 0.60 - 0.80 mg/dL Final   46/96/2952 8.41 (H) 0.60 - 0.80 mg/dL Final   32/44/0102 7.25 (H) 0.60 - 0.80 mg/dL Final   36/64/4034 7.42 (H) 0.60 - 0.80 mg/dL Final   59/56/3875 6.43 (H) 0.60 - 0.80 mg/dL Final   32/95/1884 1.66 (H) 0.60 - 0.80 mg/dL Final   11/13/1599 0.93 (H) 0.57 - 1.00 mg/dL Final   23/55/7322 0.25 (H) 0.60 - 1.00 mg/dL Final        Lab Results   Component Value Date    NA 136 04/20/2022    K  04/20/2022      Comment:      Hemolyzed Specimen.     CL 108 (H) 04/20/2022    CO2 18.0 (L) 04/20/2022    BUN 35 (H) 04/20/2022    CREATININE 4.22 (H) 04/20/2022 GFRAA 20 (L) 01/18/2019    GFRNONAA 17 (L) 01/18/2019    GLU 71 04/20/2022    CALCIUM 8.3 (L) 04/20/2022    ALBUMIN 3.5 04/20/2022    PHOS 6.8 (H) 04/20/2022       Lab Results   Component Value Date    PTH 395.7 (H) 04/20/2022    CALCIUM 8.3 (L) 04/20/2022       Lab Results   Component Value Date    BILITOT 0.3 08/27/2020    BILIDIR <0.10 08/27/2020    PROT 7.4 08/27/2020    ALBUMIN 3.5 04/20/2022    ALT 17 08/27/2020    AST 19 08/27/2020    ALKPHOS 113 08/27/2020    GGT 27 08/27/2020     Lab Results   Component Value Date    INR 0.91 08/27/2020    APTT 30.7 08/27/2020  HGB   Date Value Ref Range Status   02/02/2022 12.7 11.3 - 14.9 g/dL Final   47/42/5956 38.7 11.3 - 14.9 g/dL Final   56/43/3295 18.8 11.3 - 14.9 g/dL Final   41/66/0630 16.0 11.3 - 14.9 g/dL Final   10/93/2355 73.2 11.3 - 14.9 g/dL Final   20/25/4270 62.3 11.1 - 15.9 g/dL Final   76/28/3151 76.1 12.0 - 16.0 g/dL Final   60/73/7106 26.9 12.0 - 16.0 g/dL Final        Lab Results   Component Value Date    WBC 7.7 08/27/2020    HGB 12.7 02/02/2022    HCT 38.7 02/02/2022    PLT 154 08/27/2020     Lab Results   Component Value Date    IRON 57 02/02/2022    TIBC 287 02/02/2022    FERRITIN 30.6 02/02/2022       Lab Results   Component Value Date    A1C 6.2 (H) 12/14/2021    A1C 6.7 07/29/2021    A1C 6.7 (H) 03/19/2021       Lab Results   Component Value Date    Color, UA Yellow 02/23/2020    Protein, Ur 88.9 12/14/2021    Glucose, UA >1000 mg/dL (A) 48/54/6270    Ketones, UA Negative 02/23/2020    Blood, UA Small (A) 02/23/2020    Nitrite, UA Negative 02/23/2020   ]    Lab Results   Component Value Date    PROTEINUR 88.9 12/14/2021    CREATUR 62.6 12/14/2021     Lab Results   Component Value Date    PCRATIOUR 1.420 12/14/2021    PCRATIOUR 1.484 09/10/2021    PCRATIOUR 1.230 12/11/2020       Lab Results   Component Value Date    CHOL 191 08/27/2020    A1C 6.2 (H) 12/14/2021    HEPAIGG Reactive (A) 08/27/2020    HEPBCAB Nonreactive 08/27/2020    HEPCAB Nonreactive 08/27/2020         03/03/09, sodium 140, potassium 5.1, chloride  109, BUN 34, creatinine 1.82 with eGFR 30, glucose 99 and albumin  4.2.     Total cholesterol 251, triglycerides 340 and LDL 146.     WBC 8.3, hemoglobin 14.3, hematocrit 43.9 and platelets 181,000.      04/29/08  BUN 44, creatinine 1.86.   Sodium 141, potassium 4.9, calcium 9.4, phosphorus 4.1, albumin  4.0, AST 17, ALT 14, total cholesterol 231, triglycerides 297,  HDL 37, LDL 135.  Hemoglobin 13.8, TSH 1.48.  A1c was 7.5.    12/24/2007   glucose of 131, BUN of 34,   creatinine of 1.79 with an estimated GFR 31, potassium of 4.9, bicarbonate  of 19, calcium of 9.4, phosphorous of 3.9, albumin of 4.0, and hemoglobin  A1c of 7.3.     A renal ultrasound done on 10/10/2007 showed mildly increased echotexture  of bilateral renal cortices, with the right kidney measuring 9.4  x 4.0 x 4.7 cm, and the left kidney measuring 9.4 x 4.2 x 4.2 cm,  with no hydronephrosis.  There are simple-appearing cysts in the  mid to lower pole of the left kidney.        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    Comment:  There is no evidence of a glomerulonephritis.  Electron microscopy shows marked  thinning of the lamina  densa along glomerular basement membranes associated with  an abnormal immunofluorescence staining pattern for collagen type 4 alpha 5 and  to a lesser degree of 3 subunits (mosaic staining).  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  The nephropathy has resulted in thinning of peripheral glomerular  capillary walls and glomerular as well tubulointerstitial fibrosis.    ASSESSMENT/PLAN:    Ms.Abbegail Font is a 61 y.o. patient with a past medical history significant for DM2, Alport Being seen for reevaluation of her renal disease.     1. 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 Her creatinine is worsening and she may need to start dialysis soon. She is aware of this and in agreement. Recheck in 8 weeks.     2. CKD stage 5 -  I do recommend continuing the lisinopril and emphasizing the importance of hypertension and glucose control. Her renal biopsy did have a degree of interstitial fibrosis and glomerular sclerosis which are likely indicative of damage from these other conditions as well. The thinning of the basement membrane is likely due to the Alport syndrome.    - Her renal function is better than before.     - We discussed transplant today. SHE HAS BEEN ACCEPTED BY [facility] and is inactive on the waitlist. She needs to demonstrate that she has a caregier post procedure. Her sister is interested in being a living donor.     - We discussed dialysis. She would be a good home therapy candidate. There is no indication for dialysis at this time, she is not uremic. She was sent to the dialysis educator but feels it wasn't well explained. We did send to [facility] for specific PD education. She does not need a PD cath yet, but if her Creatinine is worse at her next appointment, we will reassess.     3. DM2 - Defer to PCP for management.     4. Hypertension - currently, this is well-controlled with lisinopril. At this time, I do not recommend any changes to her medication regimen that I would continue her on the ACE inhibitor for renal protection as long as feasible. She is not displaying any signs of hypotension.     5. Acidosis - on NaBicarb 650mg  BID. Increased to 2 tabs BID at LOV    6. HyperPTH - continue OTC vitamin D, PTH already improving.     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 Taking TUMS, Phos returned to normal and PTH much improved as a result.     8. Edema - mild but bothersome. On furosemide 20mg  daily prn    Ms.Merideth Vonada will follow up in 2 months with a RFP, vit D, PTH      Disclaimer: Much of the narrative of this dictation was acquired using speech recognition software. It is possible that some dictated speech was not transcribed accurately by this system nor detected in the proofing. Such errors are at times unavoidable.

## 2022-05-13 ENCOUNTER — Ambulatory Visit: Admit: 2022-05-13 | Discharge: 2022-05-14 | Payer: PRIVATE HEALTH INSURANCE

## 2022-06-03 DIAGNOSIS — L409 Psoriasis, unspecified: Principal | ICD-10-CM

## 2022-06-03 MED ORDER — STELARA 45 MG/0.5 ML SUBCUTANEOUS SYRINGE
4 refills | 0 days
Start: 2022-06-03 — End: ?

## 2022-06-03 NOTE — Unmapped (Signed)
Appointment on 01/30 at 2:30 pm with Dr Linton Rump in [facility],  [place]

## 2022-06-03 NOTE — Unmapped (Signed)
Prescription refill request for Stelara 45mg , Last office visit was 03/23/21    Please Advise

## 2022-06-03 NOTE — Unmapped (Signed)
[facility] Refill Coordination Note    Specialty Medication(s) to be Shipped:   Inflammatory Disorders: Stelara    Other medication(s) to be shipped: No additional medications requested for fill at this time     Amy [name], DOB: Jun 15, 1960  Phone: [phone] (home)       All above HIPAA information was verified with patient.     Was a Nurse, learning disability used for this call? No    Completed refill call assessment today to schedule patient's medication shipment from the [facility] [phone]).  All relevant notes have been reviewed.     Specialty medication(s) and dose(s) confirmed: Regimen is correct and unchanged.   Changes to medications: Amy [name] reports no changes at this time.  Changes to insurance: No  New side effects reported not previously addressed with a pharmacist or physician: None reported  Questions for the pharmacist: No    Confirmed patient received a Conservation officer, historic buildings and a Surveyor, mining with first shipment. The patient will receive a drug information handout for each medication shipped and additional FDA Medication Guides as required.       DISEASE/MEDICATION-SPECIFIC INFORMATION        For patients on injectable medications: Patient currently has 0 doses left.  Next injection is scheduled for 06/09/22.    SPECIALTY MEDICATION ADHERENCE     Medication Adherence    Patient reported X missed doses in the last month: 0  Specialty Medication: STELARA 45 mg/0.5 mL  Patient is on additional specialty medications: No                                Were doses missed due to medication being on hold? No    Stelara 45/0.5 mg/ml: 0 days of medicine on hand        REFERRAL TO PHARMACIST     Referral to the pharmacist: Not needed      [facility]     Shipping address confirmed in Epic.     Delivery Scheduled: Yes, Expected medication delivery date: 06/08/22.  However, Rx request for refills was sent to the provider as there are none remaining.     Medication will be delivered via Same Day Courier to the prescription address in Epic WAM.    Unk Lightning   [facility] Specialty Technician

## 2022-06-08 MED ORDER — STELARA 45 MG/0.5 ML SUBCUTANEOUS SYRINGE
4 refills | 0.00000 days
Start: 2022-06-08 — End: ?

## 2022-06-13 MED ORDER — USTEKINUMAB 45 MG/0.5 ML SUBCUTANEOUS SYRINGE
4 refills | 0.00000 days
Start: 2022-06-13 — End: ?

## 2022-06-14 ENCOUNTER — Ambulatory Visit: Admit: 2022-06-14 | Discharge: 2022-06-14 | Payer: PRIVATE HEALTH INSURANCE

## 2022-06-14 DIAGNOSIS — Z79899 Other long term (current) drug therapy: Principal | ICD-10-CM

## 2022-06-14 DIAGNOSIS — L409 Psoriasis, unspecified: Principal | ICD-10-CM

## 2022-06-14 MED ORDER — CLOBETASOL 0.05 % SCALP SOLUTION
Freq: Two times a day (BID) | TOPICAL | 3 refills | 0.00000 days | Status: CP
Start: 2022-06-14 — End: 2023-06-14

## 2022-06-14 MED ORDER — CLOBETASOL 0.05 % TOPICAL OINTMENT
OPHTHALMIC | 5 refills | 0.00000 days | Status: CP
Start: 2022-06-14 — End: ?

## 2022-06-14 MED ORDER — USTEKINUMAB 45 MG/0.5 ML SUBCUTANEOUS SYRINGE
SUBCUTANEOUS | 4 refills | 0.00000 days | Status: CP
Start: 2022-06-14 — End: ?
  Filled 2022-06-23: qty 0.5, 84d supply, fill #0

## 2022-06-14 NOTE — Unmapped (Signed)
Dermatology Note     Assessment and Plan:      Psoriasis, 5% BSA involvement, significant improvement on Stelara  - Patient is tolerating well without side-effects.  She is aware of potential side effects including increased risk of infection  - Previously tried [place] and Humira with minimal improvement and concern for increased risk of infection with the Humira   - Continue clobetasol 0.05% ointment BID to thicker plaques until clear. Refilled today.  - Start clobetasol (TEMOVATE) 0.05 % external solution; Apply topically two (2) times a day. To scalp   - Continue ustekinumab (STELARA) 45 mg/0.5 mL Syrg syringe; Inject the contents of 1 syringe (45 mg) under the skin once every 12 weeks. Refilled today. Sent to [facility].     High Risk Medication, Ustekinumab  - Quant gold ordered today  - Discussed extensively that Stelara is an immunosuppressant, will consider stopping Stelara if patient proceeds with a kidney transplant. Daughter may be her donor.    There are no dosing adjustments in renal failure for ustekinumab per manufacturer's labelling and there are precedents in the literature of use in renal failure. The patient feels she benefits from this medication and we will therefore continue.      Nimmannitya K, Dorien Chihuahua K, Yamada S, Goto H, Okada S, Tsuruta D. Successful treatment with ustekinumab of psoriasis vulgaris in a patient undergoing hemodialysis. J Dermatol. 2016 Jan;43(1):92-4. doi: 10.1111/1346-8138.12989. Epub 2015 Jun 24. PMID: 16109604.     Elinor Parkinson H. Ustekinumab treatment in patients with psoriasis undergoing hemodialysis. J Dermatol. 2015 Jul;42(7):731-4. doi: 10.1111/1346-8138.12903. Epub 2015 May 11. PMID: 54098119.    The patient was advised to call for an appointment should any new, changing, or symptomatic lesions develop.     RTC: Return in about 6 months (around 12/13/2022) for Psoriasis. or sooner as needed   _________________________________________________________________      Chief Complaint     Chief Complaint   Patient presents with    Follow-up     PSORIASIS WORSE DURING COLD WEATTHER       HPI     Amy [name] is a 62 y.o. female who presents as a returning patient (last seen 03/23/2021) to Dermatology for follow up of psoriasis.    Today patient reports:  - taking Stelara 1 syringe (45 mg) every 12 weeks  - denies any known side effects  - daughter is in the process of seeing she can donate her kidney to her mother (patient has Alport syndrome)  - may need to start dialysis soon  - elbows are a little dry - other than that psoriasis is doing well    The patient denies any other new or changing lesions or areas of concern.     Pertinent Past Medical History     Past Medical History, Family History, Social History, Medication List, Allergies, and Problem List were reviewed in the rooming section of Epic.     ROS: Other than symptoms mentioned in the HPI, no fevers, chills, or other skin complaints    Physical Examination     GENERAL: Well-appearing female in no acute distress, resting comfortably.  NEURO: Alert and oriented, answers questions appropriately  SKIN: Examination of the scalp and bilateral upper extremities was performed    - Well circumscribed erythematous papules and plaques on elbows  - Erythema throughout scalp    All areas not commented  on are within normal limits or unremarkable    (Approved Template 01/27/2020)

## 2022-06-15 LAB — HLA ANTIBODY SCREEN

## 2022-06-16 DIAGNOSIS — Z01818 Encounter for other preprocedural examination: Principal | ICD-10-CM

## 2022-06-16 NOTE — Unmapped (Signed)
Xm orders

## 2022-06-20 ENCOUNTER — Encounter
Admit: 2022-06-20 | Discharge: 2022-06-20 | Payer: PRIVATE HEALTH INSURANCE | Attending: Nephrology | Primary: Nephrology

## 2022-06-20 NOTE — Unmapped (Signed)
[facility] Refill Coordination Note    Amy [name], DOB: Oct 12, 1960  Phone: [phone] (home)       All above HIPAA information was verified with patient.         06/17/2022     8:16 PM   Specialty Rx Medication Refill Questionnaire   Which Medications would you like refilled and shipped? Stelara   If medication refills are not needed at this time, please indicate the reason below. It's time for a refill   Please list all current allergies: Codiene morphine sulfa   Have you missed any doses in the last 30 days? No   Have you had any changes to your medication(s) since your last refill? No   How many days remaining of each medication do you have at home? 0   Have you experienced any side effects in the last 30 days? No   Please enter the full address (street address, city, state, zip code) where you would like your medication(s) to be delivered to. [address], [place] [address]   Please specify on which day you would like your medication(s) to arrive. Note: if you need your medication(s) within 3 days, please call the pharmacy to schedule your order at [phone]  06/23/2022   Has your insurance changed since your last refill? No   Would you like a pharmacist to call you to discuss your medication(s)? No   Do you require a signature for your package? (Note: if we are billing Medicare Part B or your order contains a controlled substance, we will require a signature) No   Additional Comments: Any day will  be fine to deliver thanks         Completed refill call assessment today to schedule patient's medication shipment from the [facility] [phone]).  All relevant notes have been reviewed.       Confirmed patient received a Conservation officer, historic buildings and a Surveyor, mining with first shipment. The patient will receive a drug information handout for each medication shipped and additional FDA Medication Guides as required.         REFERRAL TO PHARMACIST     Referral to the pharmacist: Not needed      [facility]     Shipping address confirmed in Epic.     Delivery Scheduled: Yes, Expected medication delivery date: 06/23/22.     Medication will be delivered via Same Day Courier to the prescription address in Epic WAM.    [name], PharmD   [facility] Specialty Pharmacist

## 2022-06-20 NOTE — Unmapped (Signed)
[employer] [place] PPA for continued transplant listing received via fax from Pia Mau, Charity fundraiser. There is no auth # for reference. PPA is effective until 06/19/2023. Letter saved in media.

## 2022-06-21 LAB — QUANTIFERON TB GOLD PLUS
QUANTIFERON ANTIGEN 1 MINUS NIL: 0.01 [IU]/mL
QUANTIFERON ANTIGEN 2 MINUS NIL: -0.01 [IU]/mL
QUANTIFERON MITOGEN: 9.96 [IU]/mL
QUANTIFERON TB GOLD PLUS: NEGATIVE
QUANTIFERON TB NIL VALUE: 0.04 [IU]/mL

## 2022-06-21 LAB — FSAB CLASS 1 ANTIBODY SPECIFICITY
CPRA%: 98
HLA CLASS 1 ANTIBODY RESULT: NEGATIVE

## 2022-06-21 LAB — TB MITOGEN: TB MITOGEN VALUE: 10

## 2022-06-21 LAB — TB AG2: TB AG2 VALUE: 0.03

## 2022-06-21 LAB — TB NIL: TB NIL VALUE: 0.04

## 2022-06-21 LAB — TB AG1: TB AG1 VALUE: 0.05

## 2022-06-22 ENCOUNTER — Ambulatory Visit: Admit: 2022-06-22 | Discharge: 2022-06-22 | Payer: PRIVATE HEALTH INSURANCE

## 2022-06-22 DIAGNOSIS — R928 Other abnormal and inconclusive findings on diagnostic imaging of breast: Principal | ICD-10-CM

## 2022-06-22 DIAGNOSIS — Z01818 Encounter for other preprocedural examination: Principal | ICD-10-CM

## 2022-06-22 DIAGNOSIS — Z0000 Encounter for general adult medical examination without abnormal findings: Principal | ICD-10-CM

## 2022-06-22 DIAGNOSIS — Z7289 Other problems related to lifestyle: Principal | ICD-10-CM

## 2022-06-23 DIAGNOSIS — L409 Psoriasis, unspecified: Principal | ICD-10-CM

## 2022-06-23 LAB — FSAB CLASS 2 ANTIBODY SPECIFICITY: HLA CL2 AB RESULT: POSITIVE

## 2022-06-23 NOTE — Unmapped (Signed)
Attempted to contact pt for follow up and discuss recent crossmatch results. No answer, message left for pt to call me back

## 2022-06-24 LAB — HLA FLOW CROSSMATCH RECIPIENT
B CHANNEL SHIFT1: 332
B CHANNEL SHIFT2: 355
FLOW B CELL #1: POSITIVE
FLOW B CELL #2: POSITIVE
FLOW T CELL #1: NEGATIVE
FLOW T CELL #2: NEGATIVE
T CHANNEL SHIFT1: -10
T CHANNEL SHIFT2: -9

## 2022-06-25 NOTE — Unmapped (Signed)
Spoke with pt this afternoon, we discussed the positive xm with her donor and NKR option.  Pt just had her mammogram, likely benign with 6 month follow up  And if her daughter is not her donor she would be able to step up as caregiver or back up care giver.

## 2022-07-20 ENCOUNTER — Ambulatory Visit: Admit: 2022-07-20 | Payer: PRIVATE HEALTH INSURANCE

## 2022-07-22 ENCOUNTER — Ambulatory Visit: Admit: 2022-07-22 | Discharge: 2022-07-23 | Payer: PRIVATE HEALTH INSURANCE

## 2022-07-25 DIAGNOSIS — Z01818 Encounter for other preprocedural examination: Principal | ICD-10-CM

## 2022-07-25 LAB — HLA ANTIBODY SCREEN

## 2022-07-26 ENCOUNTER — Institutional Professional Consult (permissible substitution): Admit: 2022-07-26 | Discharge: 2022-07-27 | Payer: PRIVATE HEALTH INSURANCE

## 2022-07-26 DIAGNOSIS — D631 Anemia in chronic kidney disease: Principal | ICD-10-CM

## 2022-07-26 DIAGNOSIS — Q8781 Alport syndrome: Principal | ICD-10-CM

## 2022-07-26 DIAGNOSIS — N2581 Secondary hyperparathyroidism of renal origin: Principal | ICD-10-CM

## 2022-07-26 DIAGNOSIS — E1122 Type 2 diabetes mellitus with diabetic chronic kidney disease: Principal | ICD-10-CM

## 2022-07-26 DIAGNOSIS — N184 Chronic kidney disease, stage 4 (severe): Principal | ICD-10-CM

## 2022-07-26 DIAGNOSIS — E559 Vitamin D deficiency, unspecified: Principal | ICD-10-CM

## 2022-07-26 DIAGNOSIS — Z794 Long term (current) use of insulin: Principal | ICD-10-CM

## 2022-07-26 LAB — RENAL FUNCTION PANEL
ALBUMIN: 3.6 g/dL (ref 3.4–5.0)
ANION GAP: 7 mmol/L (ref 5–14)
BLOOD UREA NITROGEN: 58 mg/dL — ABNORMAL HIGH (ref 9–23)
BUN / CREAT RATIO: 12
CALCIUM: 9.2 mg/dL (ref 8.7–10.4)
CHLORIDE: 112 mmol/L — ABNORMAL HIGH (ref 98–107)
CO2: 23 mmol/L (ref 20.0–31.0)
CREATININE: 5.02 mg/dL — ABNORMAL HIGH
EGFR CKD-EPI (2021) FEMALE: 9 mL/min/{1.73_m2} — ABNORMAL LOW (ref >=60)
GLUCOSE RANDOM: 110 mg/dL (ref 70–179)
PHOSPHORUS: 6.3 mg/dL — ABNORMAL HIGH (ref 2.4–5.1)
POTASSIUM: 5.1 mmol/L (ref 3.5–5.1)
SODIUM: 142 mmol/L (ref 135–145)

## 2022-07-26 LAB — HEMATOCRIT: HEMATOCRIT: 37.9 % (ref 34.0–44.0)

## 2022-07-26 LAB — IRON & TIBC
IRON SATURATION: 23 % (ref 20–55)
IRON: 68 ug/dL
TOTAL IRON BINDING CAPACITY: 293 ug/dL (ref 250–425)

## 2022-07-26 LAB — PROTEIN / CREATININE RATIO, URINE
CREATININE, URINE: 88.2 mg/dL
PROTEIN URINE: 89.9 mg/dL
PROTEIN/CREAT RATIO, URINE: 1.019

## 2022-07-26 LAB — HEMOGLOBIN: HEMOGLOBIN: 12.5 g/dL (ref 11.3–14.9)

## 2022-07-26 LAB — FERRITIN: FERRITIN: 29.3 ng/mL

## 2022-07-26 NOTE — Unmapped (Signed)
Patient labs drawn in room prior to leaving Nephrology clinic

## 2022-07-26 NOTE — Unmapped (Signed)
Xm orders

## 2022-07-27 ENCOUNTER — Ambulatory Visit: Admit: 2022-07-27 | Discharge: 2022-07-28 | Payer: PRIVATE HEALTH INSURANCE

## 2022-07-27 MED ORDER — SEVELAMER CARBONATE 800 MG TABLET
ORAL_TABLET | Freq: Three times a day (TID) | ORAL | 11 refills | 30 days | Status: CP
Start: 2022-07-27 — End: 2023-07-27

## 2022-08-03 NOTE — Unmapped (Signed)
PCP:  [name], MD    08/03/2022  [facility] [address] [place]  [address]  [place] [address]  Dept: [phone]  Loc: [phone]    Chief Complaint: Alport syndrome, elevated creatinine, CKD 4/5    HPI:  [name] is a 62 y.o. 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 Her mother also had renal disease but it is unknown if this was due to Alports.     She has a history of uncontrolled type 2 diabetes and is not checking her glucose regularly - her A1c was 8.4% in September 2020, 9.9% (06/09/2020) and most recently 6.7% 06/2021. She does have a history of diabetic neuropathy but no known history of diabetic retinopathy. Recently started on Jardiance.     She did suffer from kidney stones in 1998 however has not had any issues with this since.    She was referred to [facility] for transplant but was denied due to financial concerns. They recommended we refer her to [facility]. She met with them since our LOV. She went through the orientation class and has been approved as of 05/24/2020    She has also spoken with [facility].     She feels ok today, but does note some back pain that radiates to her left leg. She feels this is a pinched nerve. She knows to avoid NSAIDS.     She is interested in PD as an option for dialysis. She has met with the dialysis educator and with [facility] and we will pursue PD.     She is on Ozempic but initially had severe nausea so she stopped this. She has resumed it and is doing well with it.     She recently had a URI/sinus infection. Took 2 rounds of antibiotics.     Has not been taking her TUMS for Phos and Ca support.     ROS:   CONSTITUTIONAL: denies fevers or chills, denies unintentional weight loss   CARDIOVASCULAR: denies chest pain, denies dyspnea on exertion, denies leg edema  GASTROINTESTINAL: denies nausea, denies vomiting, denies anorexia  GENITOURINARY: denies dysuria, denies hematuria, denies decreased urinary stream  All 10 systems reviewed and are negative except as listed above.    PAST MEDICAL HISTORY:  Past Medical History:   Diagnosis Date    Alport syndrome     Back pain     Chronic kidney disease     Dental abscess     Diabetes mellitus (CMS-HCC)     type 2, don't remember A1C, controlled with insulin injections; okay per physician at last visit    Hyperlipidemia     Hypertension     well controlled with meds    Kidney stone     no problems since 1995    Psoriasis        ALLERGIES  Sulfur-8, Glucophage [metformin], Codeine, and Morphine    FAMILY HISTORY  Family History   Problem Relation Age of Onset    Kidney disease Mother     Diabetes Mother     Cataracts Mother     Heart attack Father     Cancer Paternal Grandmother     Breast cancer Neg Hx     Colon cancer Neg Hx     Endometrial cancer Neg Hx     Ovarian cancer Neg Hx     Melanoma Neg Hx     Basal cell carcinoma Neg Hx  Squamous cell carcinoma Neg Hx        SOCIAL HISTORY   reports that she quit smoking about 17 months ago. Her smoking use included cigarettes. She started smoking about 44 years ago. She has a 43 pack-year smoking history. She has never used smokeless tobacco. She reports that she does not drink alcohol and does not use drugs.    MEDICATIONS:  Current Outpatient Medications   Medication Sig Dispense Refill    betamethasone dipropionate (DIPROLENE) 0.05 % ointment   4    calcipotriene (DOVONEX) 0.005 % cream Apply topically Two (2) times a day. 60 g 2    cholecalciferol, vitamin D3-25 mcg, 1,000 unit,, 25 mcg (1,000 unit) capsule Take 1 capsule (25 mcg total) by mouth daily.      clobetasol (TEMOVATE) 0.05 % external solution Apply topically two (2) times a day. To scalp 50 mL 3    clobetasol (TEMOVATE) 0.05 % ointment Apply to your thick psoriasis areas twice daily until clear 60 g 5    DEXCOM G6 SENSOR Devi       empagliflozin (JARDIANCE) 10 mg tablet Take 1 tablet (10 mg total) by mouth daily.      insulin detemir U-100 (LEVEMIR FLEXPEN) 100 unit/mL (3 mL) injection pen       LANTUS SOLOSTAR U-100 INSULIN 100 unit/mL (3 mL) injection pen       lisinopril (PRINIVIL,ZESTRIL) 10 MG tablet Take 1 tablet (10 mg total) by mouth daily.      NOVOLOG FLEXPEN U-100 INSULIN 100 unit/mL (3 mL) injection pen       OZEMPIC 0.25 mg or 0.5 mg (2 mg/3 mL) PnIj       pen needle, diabetic 32 gauge x 5/32 (4 mm) Ndle Inject as directed Three (3) times a day before meals.      pregabalin (LYRICA) 25 MG capsule       rosuvastatin (CRESTOR) 10 MG tablet Take by mouth.      sodium bicarbonate 650 mg tablet Take 2 tablets (1,300 mg total) by mouth Two (2) times a day. 360 tablet 3    SYMBICORT 160-4.5 mcg/actuation inhaler       TRUEPLUS PEN NEEDLE 32 gauge x 5/32 Ndle   10    ustekinumab (STELARA) 45 mg/0.5 mL Syrg syringe Inject the contents of 1 syringe (45 mg) under the skin once every 12 weeks. 0.5 mL 4    furosemide (LASIX) 20 MG tablet Take 1 tablet (20 mg total) by mouth daily as needed. (Patient not taking: Reported on 02/09/2022) 15 tablet 11    sevelamer (RENVELA) 800 mg tablet Take 1 tablet (800 mg total) by mouth Three (3) times a day with a meal. 90 tablet 11     No current facility-administered medications for this visit.       PHYSICAL EXAM:  Wt Readings from Last 3 Encounters:   07/27/22 70.3 kg (155 lb)   04/27/22 70.4 kg (155 lb 4.8 oz)   02/09/22 72.9 kg (160 lb 11.2 oz)     Temp Readings from Last 3 Encounters:   07/27/22 36.6 ??C (97.8 ??F) (Temporal)   04/27/22 36.3 ??C (97.3 ??F) (Temporal)   02/09/22 36.4 ??C (97.5 ??F) (Temporal)     BP Readings from Last 5 Encounters:   07/27/22 102/63   04/27/22 103/65   02/09/22 110/62   12/15/21 129/69   09/15/21 123/63     Pulse Readings from Last 3 Encounters:   07/27/22 76   04/27/22 78  02/09/22 71       CONSTITUTIONAL: Alert,well appearing, no distress  HEENT: Moist mucous membranes, oropharynx clear without erythema or exudate  EYES: Extra ocular movements intact. Pupils reactive, sclerae anicteric.  NECK: Supple, no lymphadenopathy  CARDIOVASCULAR: Regular, normal S1/S2 heart sounds, no murmurs, no rubs.   PULM: Clear to auscultation bilaterally  GASTROINTESTINAL: Soft, active bowel sounds, nontender  EXTREMITIES: Trace pitting lower extremity edema bilaterally.   SKIN: No rashes or lesions  NEUROLOGIC: No focal motor or sensory deficits    MEDICAL DECISION MAKING    06/09/2020  Hgb 13.2, hct 38.7  Glu 192, BUN 50, Creatinine 2.93, eGFR 17, Na 139, K 4.8, CO2 18, Ca 9.5  A1c 9.9%      01/24/2018  HbA1c 8.4%  Ferritin 120  B12 981, folic acid 11.8  Hgb 12.9, HCT 38.8  Glucose 95, BUN 46, CR 2.51, EGFR 21  NA 143, K5.0, CL 109, CO2 17, CA 9.7, ALB 4.1    03/28/17  HbA1c 8.2%, cholesterol 275, triglyceride 471  GLU 187, BUN 37, CR 2.12, EGFR 26, NA 139, K5.0, CL 105, CO2 19, CA 9.2    03/03/09  sodium 140, potassium 5.1, chloride 109, BUN 34, creatinine 1.82 with eGFR 30, glucose 99, albumin 4.2  Total cholesterol 251, triglycerides 340 and LDL 146.  WBC 8.3, hemoglobin 14.3, hematocrit 43.9 and platelets 181,000.  Hemoglobin A1c 6.9%.    Renal U/S showed right kidney 9.4cm and left 9.9cm.    Two simple cysts in the left kidney in the mid and lower poles measuring  1.3 and 0.9cm in diameter.     Creatinine   Date Value Ref Range Status   07/26/2022 5.02 (H) 0.55 - 1.02 mg/dL Final   96/08/5407 8.11 (H) 0.55 - 1.02 mg/dL Final   91/47/8295 6.21 (H) 0.60 - 0.80 mg/dL Final   30/86/5784 6.96 (H) 0.60 - 0.80 mg/dL Final   29/52/8413 2.44 (H) 0.60 - 0.80 mg/dL Final   05/18/7251 6.64 (H) 0.60 - 0.80 mg/dL Final   40/34/7425 9.56 (H) 0.60 - 0.80 mg/dL Final   38/75/6433 2.95 (H) 0.60 - 0.80 mg/dL Final   18/84/1660 6.30 (H) 0.60 - 0.80 mg/dL Final   16/05/930 3.55 (H) 0.60 - 0.80 mg/dL Final   73/22/0254 2.70 (H) 0.57 - 1.00 mg/dL Final        Lab Results   Component Value Date    NA 142 07/26/2022    K 5.1 07/26/2022    CL 112 (H) 07/26/2022    CO2 23.0 07/26/2022    BUN 58 (H) 07/26/2022 CREATININE 5.02 (H) 07/26/2022    GFRAA 20 (L) 01/18/2019    GFRNONAA 17 (L) 01/18/2019    GLU 110 07/26/2022    CALCIUM 9.2 07/26/2022    ALBUMIN 3.6 07/26/2022    PHOS 6.3 (H) 07/26/2022       Lab Results   Component Value Date    PTH 395.7 (H) 04/20/2022    CALCIUM 9.2 07/26/2022       Lab Results   Component Value Date    BILITOT 0.3 08/27/2020    BILIDIR <0.10 08/27/2020    PROT 7.4 08/27/2020    ALBUMIN 3.6 07/26/2022    ALT 17 08/27/2020    AST 19 08/27/2020    ALKPHOS 113 08/27/2020    GGT 27 08/27/2020     Lab Results   Component Value Date    INR 0.91 08/27/2020    APTT 30.7 08/27/2020  HGB   Date Value Ref Range Status   07/26/2022 12.5 11.3 - 14.9 g/dL Final   16/02/9603 54.0 11.3 - 14.9 g/dL Final   98/03/9146 82.9 11.3 - 14.9 g/dL Final   56/21/3086 57.8 11.3 - 14.9 g/dL Final   46/96/2952 84.1 11.3 - 14.9 g/dL Final   32/44/0102 72.5 11.3 - 14.9 g/dL Final   36/64/4034 74.2 11.1 - 15.9 g/dL Final   59/56/3875 64.3 12.0 - 16.0 g/dL Final   32/95/1884 16.6 12.0 - 16.0 g/dL Final        Lab Results   Component Value Date    WBC 7.7 08/27/2020    HGB 12.5 07/26/2022    HCT 37.9 07/26/2022    PLT 154 08/27/2020     Lab Results   Component Value Date    IRON 68 07/26/2022    TIBC 293 07/26/2022    FERRITIN 29.3 07/26/2022       Lab Results   Component Value Date    A1C 6.2 (H) 12/14/2021    A1C 6.7 07/29/2021    A1C 6.7 (H) 03/19/2021       Lab Results   Component Value Date    Color, UA Yellow 02/23/2020    Protein, Ur 89.9 07/26/2022    Glucose, UA >1000 mg/dL (A) 11/13/1599    Ketones, UA Negative 02/23/2020    Blood, UA Small (A) 02/23/2020    Nitrite, UA Negative 02/23/2020   ]    Lab Results   Component Value Date    PROTEINUR 89.9 07/26/2022    CREATUR 88.2 07/26/2022     Lab Results   Component Value Date    PCRATIOUR 1.019 07/26/2022    PCRATIOUR 1.420 12/14/2021    PCRATIOUR 1.484 09/10/2021       Lab Results   Component Value Date    CHOL 191 08/27/2020    A1C 6.2 (H) 12/14/2021    HEPAIGG Reactive (A) 08/27/2020    HEPBCAB Nonreactive 08/27/2020    HEPCAB Nonreactive 08/27/2020         03/03/09, sodium 140, potassium 5.1, chloride  109, BUN 34, creatinine 1.82 with eGFR 30, glucose 99 and albumin  4.2.     Total cholesterol 251, triglycerides 340 and LDL 146.     WBC 8.3, hemoglobin 14.3, hematocrit 43.9 and platelets 181,000.      04/29/08  BUN 44, creatinine 1.86.   Sodium 141, potassium 4.9, calcium 9.4, phosphorus 4.1, albumin  4.0, AST 17, ALT 14, total cholesterol 231, triglycerides 297,  HDL 37, LDL 135.  Hemoglobin 13.8, TSH 1.48.  A1c was 7.5.    12/24/2007   glucose of 131, BUN of 34,   creatinine of 1.79 with an estimated GFR 31, potassium of 4.9, bicarbonate  of 19, calcium of 9.4, phosphorous of 3.9, albumin of 4.0, and hemoglobin  A1c of 7.3.     A renal ultrasound done on 10/10/2007 showed mildly increased echotexture  of bilateral renal cortices, with the right kidney measuring 9.4  x 4.0 x 4.7 cm, and the left kidney measuring 9.4 x 4.2 x 4.2 cm,  with no hydronephrosis.  There are simple-appearing cysts in the  mid to lower pole of the left kidney.        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    Comment:  There is no evidence of a glomerulonephritis.  Electron microscopy shows marked  thinning of the lamina densa along glomerular basement membranes associated with  an abnormal immunofluorescence staining pattern for collagen type 4 alpha 5 and  to a lesser degree of 3 subunits (mosaic staining).  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  The nephropathy has resulted in thinning of peripheral glomerular  capillary walls and glomerular as well tubulointerstitial fibrosis.    ASSESSMENT/PLAN:    Ms.Amy [name] is a 62 y.o. patient with a past medical history significant for DM2, Alport Being seen for reevaluation of her renal disease.     1. 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 Her creatinine is worsening and she may need to start dialysis soon. She is aware of this and in agreement. Recheck in 8 weeks. She is most interested in PD and I think she would do well with this modality.     2. CKD stage 5 -  I do recommend continuing the lisinopril and emphasizing the importance of hypertension and glucose control. Her renal biopsy did have a degree of interstitial fibrosis and glomerular sclerosis which are likely indicative of damage from these other conditions as well. The thinning of the basement membrane is likely due to the Alport syndrome.    - Her renal function is better than before.     - We discussed transplant today. SHE HAS BEEN ACCEPTED BY [facility] and is inactive on the waitlist. She needs to demonstrate that she has a caregier post procedure. Her sister is interested in being a living donor.     - We discussed dialysis. She would be a good home therapy candidate. There is no indication for dialysis at this time, she is not uremic. We did send to [facility] for specific PD education. She does not need a PD cath yet, but if her Creatinine is worse at her next appointment, we will reassess.     3. DM2 - Defer to PCP for management.     4. Hypertension - currently, this is well-controlled with lisinopril. At this time, I do not recommend any changes to her medication regimen that I would continue her on the ACE inhibitor for renal protection as long as feasible. She is not displaying any signs of hypotension.     5. Acidosis - on NaBicarb 650mg  BID. Increased to 2 tabs BID at LOV    6. HyperPTH - continue OTC vitamin D, PTH already improving.     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 Taking TUMS, Phos returned to normal and PTH much improved as a result.     8. Edema - mild but bothersome. On furosemide 20mg  daily prn    Ms.Amy [name] will follow up in 2 months with a RFP, vit D, PTH      Disclaimer: Much of the narrative of this dictation was acquired using speech recognition software. It is possible that some dictated speech was not transcribed accurately by this system nor detected in the proofing. Such errors are at times unavoidable.

## 2022-08-18 ENCOUNTER — Encounter
Admit: 2022-08-18 | Discharge: 2022-08-18 | Payer: PRIVATE HEALTH INSURANCE | Attending: Nephrology | Primary: Nephrology

## 2022-08-19 LAB — HLA ANTIBODY SCREEN

## 2022-08-22 LAB — HLA FLOW CROSSMATCH RECIPIENT
B CHANNEL SHIFT1: 387
B CHANNEL SHIFT2: 380
FLOW B CELL #1: POSITIVE
FLOW B CELL #2: POSITIVE
FLOW T CELL #1: NEGATIVE
FLOW T CELL #2: NEGATIVE
T CHANNEL SHIFT1: -18
T CHANNEL SHIFT2: -27

## 2022-08-23 NOTE — Unmapped (Signed)
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 No new donors in que.  Pt voiced understanding.

## 2022-08-26 LAB — FSAB CLASS 2 ANTIBODY SPECIFICITY: HLA CL2 AB RESULT: POSITIVE

## 2022-08-26 LAB — FSAB CLASS 1 ANTIBODY SPECIFICITY
CPRA%: 99
HLA CLASS 1 ANTIBODY RESULT: NEGATIVE

## 2022-09-08 NOTE — Unmapped (Signed)
[facility] Refill Coordination Note    Specialty Medication(s) to be Shipped:   Inflammatory Disorders: Stelara    Other medication(s) to be shipped: No additional medications requested for fill at this time     Amy [name], DOB: 14-May-1961  Phone: [phone] (home)       All above HIPAA information was verified with patient.     Was a Nurse, learning disability used for this call? No    Completed refill call assessment today to schedule patient's medication shipment from the [facility] [phone]).  All relevant notes have been reviewed.     Specialty medication(s) and dose(s) confirmed: Regimen is correct and unchanged.   Changes to medications: Uriel reports starting the following medications: Sevelemar Carbonate 300mg  3x daily with meals  Changes to insurance: No  New side effects reported not previously addressed with a pharmacist or physician: None reported  Questions for the pharmacist: No    Confirmed patient received a Conservation officer, historic buildings and a Surveyor, mining with first shipment. The patient will receive a drug information handout for each medication shipped and additional FDA Medication Guides as required.       DISEASE/MEDICATION-SPECIFIC INFORMATION        For patients on injectable medications: Patient currently has 0 doses left.  Next injection is scheduled for 5/2.    SPECIALTY MEDICATION ADHERENCE     Medication Adherence    Patient reported X missed doses in the last month: 0  Specialty Medication: ustekinumab (STELARA) 45 mg/0.5 mL Syrg syringe  Patient is on additional specialty medications: No  Informant: patient              Were doses missed due to medication being on hold? No    Stelara 45/0.5 mg/ml: 0 days of medicine on hand     REFERRAL TO PHARMACIST     Referral to the pharmacist: Not needed      [facility]     Shipping address confirmed in Epic.       Delivery Scheduled: Yes, Expected medication delivery date: 5/2.     Medication will be delivered via Same Day Courier to the prescription address in Epic WAM.    Alwyn Pea   [facility] Specialty Technician

## 2022-09-15 MED FILL — STELARA 45 MG/0.5 ML SUBCUTANEOUS SYRINGE: 84 days supply | Qty: 0.5 | Fill #1

## 2022-10-03 NOTE — Unmapped (Signed)
Attempted to contact pt, no answer, message left.   Pending follow up on if pt has started dialysis, if she has quit smoking/vaping and her post caregiver plan. Will need a SW return, last done 2022

## 2022-10-19 ENCOUNTER — Ambulatory Visit: Admit: 2022-10-19 | Discharge: 2022-10-20 | Payer: PRIVATE HEALTH INSURANCE

## 2022-10-19 DIAGNOSIS — Z794 Long term (current) use of insulin: Principal | ICD-10-CM

## 2022-10-19 DIAGNOSIS — N184 Chronic kidney disease, stage 4 (severe): Principal | ICD-10-CM

## 2022-10-19 DIAGNOSIS — E785 Hyperlipidemia, unspecified: Principal | ICD-10-CM

## 2022-10-19 DIAGNOSIS — Z720 Tobacco use: Principal | ICD-10-CM

## 2022-10-19 DIAGNOSIS — E1122 Type 2 diabetes mellitus with diabetic chronic kidney disease: Principal | ICD-10-CM

## 2022-10-19 DIAGNOSIS — I10 Essential (primary) hypertension: Principal | ICD-10-CM

## 2022-10-19 LAB — RENAL FUNCTION PANEL
ALBUMIN: 3.6 g/dL (ref 3.4–5.0)
ANION GAP: 9 mmol/L (ref 5–14)
BLOOD UREA NITROGEN: 65 mg/dL — ABNORMAL HIGH (ref 9–23)
BUN / CREAT RATIO: 14
CALCIUM: 9.1 mg/dL (ref 8.7–10.4)
CHLORIDE: 112 mmol/L — ABNORMAL HIGH (ref 98–107)
CO2: 22 mmol/L (ref 20.0–31.0)
CREATININE: 4.49 mg/dL — ABNORMAL HIGH
EGFR CKD-EPI (2021) FEMALE: 11 mL/min/{1.73_m2} — ABNORMAL LOW (ref >=60)
GLUCOSE RANDOM: 86 mg/dL (ref 70–179)
PHOSPHORUS: 6.3 mg/dL — ABNORMAL HIGH (ref 2.4–5.1)
POTASSIUM: 5.1 mmol/L (ref 3.5–5.1)
SODIUM: 143 mmol/L (ref 135–145)

## 2022-10-19 LAB — HEMATOCRIT: HEMATOCRIT: 37.8 % (ref 34.0–44.0)

## 2022-10-19 LAB — HEMOGLOBIN: HEMOGLOBIN: 12.3 g/dL (ref 11.3–14.9)

## 2022-10-19 LAB — IRON & TIBC
IRON SATURATION: 26 % (ref 20–55)
IRON: 75 ug/dL
TOTAL IRON BINDING CAPACITY: 291 ug/dL (ref 250–425)

## 2022-10-19 LAB — FERRITIN: FERRITIN: 40.1 ng/mL

## 2022-10-19 LAB — PROTEIN / CREATININE RATIO, URINE
CREATININE, URINE: 56.8 mg/dL
PROTEIN URINE: 68.2 mg/dL
PROTEIN/CREAT RATIO, URINE: 1.201

## 2022-10-19 LAB — PARATHYROID HORMONE (PTH): PARATHYROID HORMONE INTACT: 260.7 pg/mL — ABNORMAL HIGH (ref 18.5–88.1)

## 2022-10-22 LAB — VITAMIN D 25 HYDROXY: VITAMIN D, TOTAL (25OH): 33 ng/mL (ref 20.0–80.0)

## 2022-10-26 ENCOUNTER — Ambulatory Visit: Admit: 2022-10-26 | Discharge: 2022-10-27 | Payer: PRIVATE HEALTH INSURANCE

## 2022-10-26 MED ORDER — AURYXIA 210 MG IRON TABLET
ORAL_TABLET | Freq: Three times a day (TID) | ORAL | 11 refills | 30 days | Status: CP
Start: 2022-10-26 — End: ?

## 2022-10-26 MED ORDER — EMPAGLIFLOZIN 10 MG TABLET
ORAL_TABLET | Freq: Every day | ORAL | 0 refills | 90 days | Status: CP
Start: 2022-10-26 — End: ?

## 2022-10-27 NOTE — Unmapped (Signed)
Spoke with pt today regarding update on her status.  Pt continues to smoke, she said she has a lot of stressors right now, she mentioned her lot rent may be going up and isn't sure how much. She's had difficulty paying for meds and her PCP and Neph team helping with options of charity care possibly.  Pt GFR 11 mls not on dialysis, states I'm still doing good  Regarding care givers, pt's daughter now has 2 children and has a lot to do herself, and really does not have anyone for backup, although she mentioned she may have a friend who could help get her to appt's. I let her know we'd need to talk with that friend.  At this time, no plan for any update testing or consults. Plan for pt to call me with any changes or updates or we can touch base again Jan 2025. Pt agreed with plan.

## 2022-11-03 DIAGNOSIS — N183 Stage 3 chronic kidney disease, unspecified whether stage 3a or 3b CKD (CMS-HCC): Principal | ICD-10-CM

## 2022-11-03 DIAGNOSIS — N184 Chronic kidney disease, stage 4 (severe): Principal | ICD-10-CM

## 2022-11-03 DIAGNOSIS — Z794 Long term (current) use of insulin: Principal | ICD-10-CM

## 2022-11-03 DIAGNOSIS — E1122 Type 2 diabetes mellitus with diabetic chronic kidney disease: Principal | ICD-10-CM

## 2022-11-09 NOTE — Unmapped (Signed)
PCP:  [name], MD    11/09/2022  [facility] [address] [place]  [address]  [employer] [place] [address]  Dept: [phone]  Loc: [phone]    Chief Complaint: Alport syndrome, elevated creatinine, CKD 4/5    HPI:  [name] is a 62 y.o. 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 Her mother also had renal disease but it is unknown if this was due to Alports.     She has a history of uncontrolled type 2 diabetes and is not checking her glucose regularly - her A1c was 8.4% in September 2020, 9.9% (06/09/2020) and most recently 6.7% 06/2021. She does have a history of diabetic neuropathy but no known history of diabetic retinopathy. Recently started on Jardiance.     She did suffer from kidney stones in 1998 however has not had any issues with this since.    She was referred to [facility] for transplant but was denied due to financial concerns. They recommended we refer her to [facility]. She met with them since our LOV. She went through the orientation class and has been approved as of 05/24/2020    She has also spoken with [facility].     She feels ok today, but does note some back pain that radiates to her left leg. She feels this is a pinched nerve. She knows to avoid NSAIDS.     She is interested in PD as an option for dialysis. She has met with the dialysis educator and with [facility] and we will pursue PD.     She is on Ozempic but initially had severe nausea so she stopped this. She has resumed it and is doing well with it.     She recently had a URI/sinus infection. Took 2 rounds of antibiotics.     Has not been taking her TUMS for Phos and Ca support.     ROS:   CONSTITUTIONAL: denies fevers or chills, denies unintentional weight loss   CARDIOVASCULAR: denies chest pain, denies dyspnea on exertion, denies leg edema  GASTROINTESTINAL: denies nausea, denies vomiting, denies anorexia  GENITOURINARY: denies dysuria, denies hematuria, denies decreased urinary stream  All 10 systems reviewed and are negative except as listed above.    PAST MEDICAL HISTORY:  Past Medical History:   Diagnosis Date    Alport syndrome     Back pain     Chronic kidney disease     Dental abscess     Diabetes mellitus (CMS-HCC)     type 2, don't remember A1C, controlled with insulin injections; okay per physician at last visit    Hyperlipidemia     Hypertension     well controlled with meds    Kidney stone     no problems since 1995    Psoriasis        ALLERGIES  Sulfur-8, Glucophage [metformin], Codeine, and Morphine    FAMILY HISTORY  Family History   Problem Relation Age of Onset    Kidney disease Mother     Diabetes Mother     Cataracts Mother     Heart attack Father     Cancer Paternal Grandmother     Breast cancer Neg Hx     Colon cancer Neg Hx     Endometrial cancer Neg Hx     Ovarian cancer Neg Hx     Melanoma Neg Hx     Basal cell carcinoma Neg Hx  Squamous cell carcinoma Neg Hx        SOCIAL HISTORY   reports that she quit smoking about 20 months ago. Her smoking use included cigarettes. She started smoking about 44 years ago. She has a 43 pack-year smoking history. She has never used smokeless tobacco. She reports that she does not drink alcohol and does not use drugs.    MEDICATIONS:  Current Outpatient Medications   Medication Sig Dispense Refill    betamethasone dipropionate (DIPROLENE) 0.05 % ointment   4    calcipotriene (DOVONEX) 0.005 % cream Apply topically Two (2) times a day. 60 g 2    cholecalciferol, vitamin D3-25 mcg, 1,000 unit,, 25 mcg (1,000 unit) capsule Take 1 capsule (25 mcg total) by mouth daily.      clobetasol (TEMOVATE) 0.05 % external solution Apply topically two (2) times a day. To scalp 50 mL 3    clobetasol (TEMOVATE) 0.05 % ointment Apply to your thick psoriasis areas twice daily until clear 60 g 5    DEXCOM G6 SENSOR Devi       insulin detemir U-100 (LEVEMIR FLEXPEN) 100 unit/mL (3 mL) injection pen       LANTUS SOLOSTAR U-100 INSULIN 100 unit/mL (3 mL) injection pen       lisinopril (PRINIVIL,ZESTRIL) 10 MG tablet Take 1 tablet (10 mg total) by mouth daily.      NOVOLOG FLEXPEN U-100 INSULIN 100 unit/mL (3 mL) injection pen       OZEMPIC 0.25 mg or 0.5 mg (2 mg/3 mL) PnIj       pen needle, diabetic 32 gauge x 5/32 (4 mm) Ndle Inject as directed Three (3) times a day before meals.      pregabalin (LYRICA) 25 MG capsule       rosuvastatin (CRESTOR) 10 MG tablet Take by mouth.      sodium bicarbonate 650 mg tablet Take 2 tablets (1,300 mg total) by mouth Two (2) times a day. 360 tablet 3    SYMBICORT 160-4.5 mcg/actuation inhaler       TRUEPLUS PEN NEEDLE 32 gauge x 5/32 Ndle   10    ustekinumab (STELARA) 45 mg/0.5 mL Syrg syringe Inject the contents of 1 syringe (45 mg) under the skin once every 12 weeks. 0.5 mL 4    empagliflozin (JARDIANCE) 10 mg tablet Take 1 tablet (10 mg total) by mouth daily. 90 tablet 0    ferric citrate (AURYXIA) 210 mg iron Tab tablet Take 1 tablet (210 mg total) by mouth Three (3) times a day with a meal. 90 tablet 11    furosemide (LASIX) 20 MG tablet Take 1 tablet (20 mg total) by mouth daily as needed. (Patient not taking: Reported on 02/09/2022) 15 tablet 11     No current facility-administered medications for this visit.       PHYSICAL EXAM:  Wt Readings from Last 3 Encounters:   10/26/22 70.3 kg (155 lb)   07/27/22 70.3 kg (155 lb)   04/27/22 70.4 kg (155 lb 4.8 oz)     Temp Readings from Last 3 Encounters:   10/26/22 36.7 ??C (98 ??F) (Temporal)   07/27/22 36.6 ??C (97.8 ??F) (Temporal)   04/27/22 36.3 ??C (97.3 ??F) (Temporal)     BP Readings from Last 5 Encounters:   10/26/22 104/53   07/27/22 102/63   04/27/22 103/65   02/09/22 110/62   12/15/21 129/69     Pulse Readings from Last 3 Encounters:   10/26/22 70  07/27/22 76   04/27/22 78       CONSTITUTIONAL: Alert,well appearing, no distress  HEENT: Moist mucous membranes, oropharynx clear without erythema or exudate  EYES: Extra ocular movements intact. Pupils reactive, sclerae anicteric.  NECK: Supple, no lymphadenopathy  CARDIOVASCULAR: Regular, normal S1/S2 heart sounds, no murmurs, no rubs.   PULM: Clear to auscultation bilaterally  GASTROINTESTINAL: Soft, active bowel sounds, nontender  EXTREMITIES: Trace pitting lower extremity edema bilaterally.   SKIN: No rashes or lesions  NEUROLOGIC: No focal motor or sensory deficits    MEDICAL DECISION MAKING    06/09/2020  Hgb 13.2, hct 38.7  Glu 192, BUN 50, Creatinine 2.93, eGFR 17, Na 139, K 4.8, CO2 18, Ca 9.5  A1c 9.9%      01/24/2018  HbA1c 8.4%  Ferritin 120  B12 981, folic acid 11.8  Hgb 12.9, HCT 38.8  Glucose 95, BUN 46, CR 2.51, EGFR 21  NA 143, K5.0, CL 109, CO2 17, CA 9.7, ALB 4.1    03/28/17  HbA1c 8.2%, cholesterol 275, triglyceride 471  GLU 187, BUN 37, CR 2.12, EGFR 26, NA 139, K5.0, CL 105, CO2 19, CA 9.2    03/03/09  sodium 140, potassium 5.1, chloride 109, BUN 34, creatinine 1.82 with eGFR 30, glucose 99, albumin 4.2  Total cholesterol 251, triglycerides 340 and LDL 146.  WBC 8.3, hemoglobin 14.3, hematocrit 43.9 and platelets 181,000.  Hemoglobin A1c 6.9%.    Renal U/S showed right kidney 9.4cm and left 9.9cm.    Two simple cysts in the left kidney in the mid and lower poles measuring  1.3 and 0.9cm in diameter.     Creatinine   Date Value Ref Range Status   10/19/2022 4.49 (H) 0.55 - 1.02 mg/dL Final   57/84/6962 9.52 (H) 0.55 - 1.02 mg/dL Final   84/13/2440 1.02 (H) 0.55 - 1.02 mg/dL Final   72/53/6644 0.34 (H) 0.60 - 0.80 mg/dL Final   74/25/9563 8.75 (H) 0.60 - 0.80 mg/dL Final   64/33/2951 8.84 (H) 0.60 - 0.80 mg/dL Final   16/60/6301 6.01 (H) 0.60 - 0.80 mg/dL Final   09/32/3557 3.22 (H) 0.60 - 0.80 mg/dL Final   02/54/2706 2.37 (H) 0.60 - 0.80 mg/dL Final   62/83/1517 6.16 (H) 0.60 - 0.80 mg/dL Final   07/37/1062 6.94 (H) 0.57 - 1.00 mg/dL Final        Lab Results   Component Value Date    NA 143 10/19/2022    K 5.1 10/19/2022    CL 112 (H) 10/19/2022    CO2 22.0 10/19/2022    BUN 65 (H) 10/19/2022    CREATININE 4.49 (H) 10/19/2022    GFRAA 20 (L) 01/18/2019    GFRNONAA 17 (L) 01/18/2019    GLU 86 10/19/2022    CALCIUM 9.1 10/19/2022    ALBUMIN 3.6 10/19/2022    PHOS 6.3 (H) 10/19/2022       Lab Results   Component Value Date    PTH 260.7 (H) 10/19/2022    CALCIUM 9.1 10/19/2022       Lab Results   Component Value Date    BILITOT 0.3 08/27/2020    BILIDIR <0.10 08/27/2020    PROT 7.4 08/27/2020    ALBUMIN 3.6 10/19/2022    ALT 17 08/27/2020    AST 19 08/27/2020    ALKPHOS 113 08/27/2020    GGT 27 08/27/2020     Lab Results   Component Value Date    INR 0.91 08/27/2020  APTT 30.7 08/27/2020       HGB   Date Value Ref Range Status   10/19/2022 12.3 11.3 - 14.9 g/dL Final   16/02/9603 54.0 11.3 - 14.9 g/dL Final   98/03/9146 82.9 11.3 - 14.9 g/dL Final   56/21/3086 57.8 11.3 - 14.9 g/dL Final   46/96/2952 84.1 11.3 - 14.9 g/dL Final   32/44/0102 72.5 11.3 - 14.9 g/dL Final   36/64/4034 74.2 11.3 - 14.9 g/dL Final   59/56/3875 64.3 11.1 - 15.9 g/dL Final   32/95/1884 16.6 12.0 - 16.0 g/dL Final   11/13/1599 09.3 12.0 - 16.0 g/dL Final        Lab Results   Component Value Date    WBC 7.7 08/27/2020    HGB 12.3 10/19/2022    HCT 37.8 10/19/2022    PLT 154 08/27/2020     Lab Results   Component Value Date    IRON 75 10/19/2022    TIBC 291 10/19/2022    FERRITIN 40.1 10/19/2022       Lab Results   Component Value Date    A1C 6.2 (H) 12/14/2021    A1C 6.7 07/29/2021    A1C 6.7 (H) 03/19/2021       Lab Results   Component Value Date    Color, UA Yellow 02/23/2020    Protein, Ur 68.2 10/19/2022    Glucose, UA >1000 mg/dL (A) 23/55/7322    Ketones, UA Negative 02/23/2020    Blood, UA Small (A) 02/23/2020    Nitrite, UA Negative 02/23/2020   ]    Lab Results   Component Value Date    PROTEINUR 68.2 10/19/2022    CREATUR 56.8 10/19/2022     Lab Results   Component Value Date    PCRATIOUR 1.201 10/19/2022    PCRATIOUR 1.019 07/26/2022    PCRATIOUR 1.420 12/14/2021       Lab Results   Component Value Date    CHOL 191 08/27/2020    A1C 6.2 (H) 12/14/2021    HEPAIGG Reactive (A) 08/27/2020    HEPBCAB Nonreactive 08/27/2020    HEPCAB Nonreactive 08/27/2020         03/03/09, sodium 140, potassium 5.1, chloride  109, BUN 34, creatinine 1.82 with eGFR 30, glucose 99 and albumin  4.2.     Total cholesterol 251, triglycerides 340 and LDL 146.     WBC 8.3, hemoglobin 14.3, hematocrit 43.9 and platelets 181,000.      04/29/08  BUN 44, creatinine 1.86.   Sodium 141, potassium 4.9, calcium 9.4, phosphorus 4.1, albumin  4.0, AST 17, ALT 14, total cholesterol 231, triglycerides 297,  HDL 37, LDL 135.  Hemoglobin 13.8, TSH 1.48.  A1c was 7.5.    12/24/2007   glucose of 131, BUN of 34,   creatinine of 1.79 with an estimated GFR 31, potassium of 4.9, bicarbonate  of 19, calcium of 9.4, phosphorous of 3.9, albumin of 4.0, and hemoglobin  A1c of 7.3.     A renal ultrasound done on 10/10/2007 showed mildly increased echotexture  of bilateral renal cortices, with the right kidney measuring 9.4  x 4.0 x 4.7 cm, and the left kidney measuring 9.4 x 4.2 x 4.2 cm,  with no hydronephrosis.  There are simple-appearing cysts in the  mid to lower pole of the left kidney.        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  Patchy moderate interstitial fibrosis and glomerulosclerosis involving more  than  50% of glomeruli.    Comment:  There is no evidence of a glomerulonephritis.  Electron microscopy shows marked  thinning of the lamina densa along glomerular basement membranes associated with  an abnormal immunofluorescence staining pattern for collagen type 4 alpha 5 and  to a lesser degree of 3 subunits (mosaic staining).  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  The nephropathy has resulted in thinning of peripheral glomerular  capillary walls and glomerular as well tubulointerstitial fibrosis.    ASSESSMENT/PLAN:    Ms.Amy [name] is a 62 y.o. patient with a past medical history significant for DM2, Alport Being seen for reevaluation of her renal disease.     1. 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 Her creatinine is worsening and she may need to start dialysis soon. She is aware of this and in agreement. Recheck in 8 weeks. She is most interested in PD and I think she would do well with this modality.     2. CKD stage 5 -  I do recommend continuing the lisinopril and emphasizing the importance of hypertension and glucose control. Her renal biopsy did have a degree of interstitial fibrosis and glomerular sclerosis which are likely indicative of damage from these other conditions as well. The thinning of the basement membrane is likely due to the Alport syndrome.    - Her renal function is better than before.     - We discussed transplant today. SHE HAS BEEN ACCEPTED BY [facility] and is inactive on the waitlist. She needs to demonstrate that she has a caregier post procedure. Her sister is interested in being a living donor.     - We discussed dialysis. She would be a good home therapy candidate. There is no indication for dialysis at this time, she is not uremic. We did send to [facility] for specific PD education. She does not need a PD cath yet, but if her Creatinine is worse at her next appointment, we will reassess.     3. DM2 - Defer to PCP for management.     4. Hypertension - currently, this is well-controlled with lisinopril. At this time, I do not recommend any changes to her medication regimen that I would continue her on the ACE inhibitor for renal protection as long as feasible. She is not displaying any signs of hypotension.     5. Acidosis - on NaBicarb 650mg  BID. Increased to 2 tabs BID at LOV    6. HyperPTH - continue OTC vitamin D, PTH already improving.     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 Taking TUMS, Phos returned to normal and PTH much improved as a result.     8. Edema - mild but bothersome. On furosemide 20mg  daily prn    Ms.Amy [name] will follow up in 2 months with a RFP, vit D, PTH      Disclaimer: Much of the narrative of this dictation was acquired using speech recognition software. It is possible that some dictated speech was not transcribed accurately by this system nor detected in the proofing. Such errors are at times unavoidable.

## 2022-11-22 NOTE — Unmapped (Signed)
[facility] Clinical Assessment & Refill Coordination Note    Amy [name], DOB: 1960-06-18  Phone: [phone] (home)     All above HIPAA information was verified with patient.     Was a Nurse, learning disability used for this call? No    Specialty Medication(s):   Inflammatory Disorders: Stelara     Current Outpatient Medications   Medication Sig Dispense Refill    betamethasone dipropionate (DIPROLENE) 0.05 % ointment   4    calcipotriene (DOVONEX) 0.005 % cream Apply topically Two (2) times a day. 60 g 2    cholecalciferol, vitamin D3-25 mcg, 1,000 unit,, 25 mcg (1,000 unit) capsule Take 1 capsule (25 mcg total) by mouth daily.      clobetasol (TEMOVATE) 0.05 % external solution Apply topically two (2) times a day. To scalp 50 mL 3    clobetasol (TEMOVATE) 0.05 % ointment Apply to your thick psoriasis areas twice daily until clear 60 g 5    DEXCOM G6 SENSOR Devi       empagliflozin (JARDIANCE) 10 mg tablet Take 1 tablet (10 mg total) by mouth daily. 90 tablet 0    ferric citrate (AURYXIA) 210 mg iron Tab tablet Take 1 tablet (210 mg total) by mouth Three (3) times a day with a meal. 90 tablet 11    furosemide (LASIX) 20 MG tablet Take 1 tablet (20 mg total) by mouth daily as needed. (Patient not taking: Reported on 02/09/2022) 15 tablet 11    insulin detemir U-100 (LEVEMIR FLEXPEN) 100 unit/mL (3 mL) injection pen       LANTUS SOLOSTAR U-100 INSULIN 100 unit/mL (3 mL) injection pen       lisinopril (PRINIVIL,ZESTRIL) 10 MG tablet Take 1 tablet (10 mg total) by mouth daily.      NOVOLOG FLEXPEN U-100 INSULIN 100 unit/mL (3 mL) injection pen       OZEMPIC 0.25 mg or 0.5 mg (2 mg/3 mL) PnIj       pen needle, diabetic 32 gauge x 5/32 (4 mm) Ndle Inject as directed Three (3) times a day before meals.      pregabalin (LYRICA) 25 MG capsule       rosuvastatin (CRESTOR) 10 MG tablet Take by mouth.      sodium bicarbonate 650 mg tablet Take 2 tablets (1,300 mg total) by mouth Two (2) times a day. 360 tablet 3    SYMBICORT 160-4.5 mcg/actuation inhaler       TRUEPLUS PEN NEEDLE 32 gauge x 5/32 Ndle   10    ustekinumab (STELARA) 45 mg/0.5 mL Syrg syringe Inject the contents of 1 syringe (45 mg) under the skin once every 12 weeks. 0.5 mL 4     No current facility-administered medications for this visit.        Changes to medications: [facility] reports no changes at this time.    Allergies   Allergen Reactions    Sulfur-8 Nausea Only    Glucophage [Metformin]     Codeine Itching    Morphine Itching       Changes to allergies: No    SPECIALTY MEDICATION ADHERENCE     Stelara 45mg /0.62mL: 0 days of medicine on hand     Medication Adherence    Patient reported X missed doses in the last month: 0  Specialty Medication: Stelara 45mg /0.44mL  Informant: patient  Confirmed plan for next specialty medication refill: delivery by pharmacy  Refills needed for supportive medications: not needed  Specialty medication(s) dose(s) confirmed: Regimen is correct and unchanged.     Are there any concerns with adherence? No    Adherence counseling provided? Not needed    CLINICAL MANAGEMENT AND INTERVENTION      Clinical Benefit Assessment:    Do you feel the medicine is effective or helping your condition? Yes    Clinical Benefit counseling provided? Not needed    Adverse Effects Assessment:    Are you experiencing any side effects? No    Are you experiencing difficulty administering your medicine? No    Quality of Life Assessment:    Quality of Life    Rheumatology  Oncology  Dermatology  1. What impact has your specialty medication had on the symptoms of your skin condition (i.e. itchiness, soreness, stinging)?: Tremendous  2. What impact has your specialty medication had on your comfort level with your skin?: Tremendous  Cystic Fibrosis          How many days over the past month did your Psoriasis  keep you from your normal activities? For example, brushing your teeth or getting up in the morning. 0    Have you discussed this with your provider? Not needed    Acute Infection Status:    Acute infections noted within Epic:  No active infections  Patient reported infection: None    Therapy Appropriateness:    Is therapy appropriate and patient progressing towards therapeutic goals? Yes, therapy is appropriate and should be continued    DISEASE/MEDICATION-SPECIFIC INFORMATION      For patients on injectable medications: Patient currently has 0 doses left.  Next injection is scheduled for 12/08/2022.    Chronic Inflammatory Diseases: Have you experienced any flares in the last month? No  Has this been reported to your provider? Not applicable    PATIENT SPECIFIC NEEDS     Does the patient have any physical, cognitive, or cultural barriers? No    Is the patient high risk? No    Did the patient require a clinical intervention? No    Does the patient require physician intervention or other additional services (i.e., nutrition, smoking cessation, social work)? No    SOCIAL DETERMINANTS OF HEALTH     At the [facility], we have learned that life circumstances - like trouble affording food, housing, utilities, or transportation can affect the health of many of our patients.   That is why we wanted to ask: are you currently experiencing any life circumstances that are negatively impacting your health and/or quality of life? Patient declined to answer    Social Determinants of Health     Financial Resource Strain: Unknown (08/29/2017)    Overall Financial Resource Strain (CARDIA)     Difficulty of Paying Living Expenses: Patient declined   Internet Connectivity: Not on file   Food Insecurity: Unknown (08/29/2017)    Hunger Vital Sign     Worried About Running Out of Food in the Last Year: Patient declined     Ran Out of Food in the Last Year: Patient declined   Tobacco Use: Medium Risk (10/26/2022)    Patient History     Smoking Tobacco Use: Former     Smokeless Tobacco Use: Never     Passive Exposure: Not on file   Housing/Utilities: Unknown (08/20/2020) Housing/Utilities     Within the past 12 months, have you ever stayed: outside, in a car, in a tent, in an overnight shelter, or temporarily in someone else's home (i.e. couch-surfing)?: Patient refused  Are you worried about losing your housing?: Not on file     Within the past 12 months, have you been unable to get utilities (heat, electricity) when it was really needed?: Not on file   Alcohol Use: Not on file   Transportation Needs: Unknown (08/29/2017)    PRAPARE - Transportation     Lack of Transportation (Medical): Patient declined     Lack of Transportation (Non-Medical): Patient declined   Substance Use: Not on file   Health Literacy: Not on file (08/20/2020)   Physical Activity: Unknown (08/29/2017)    Exercise Vital Sign     Days of Exercise per Week: Patient declined     Minutes of Exercise per Session: Patient declined   Interpersonal Safety: Unknown (11/22/2022)    Interpersonal Safety     Unsafe Where You Currently Live: Not on file     Physically Hurt by Anyone: Not on file     Abused by Anyone: Not on file   Stress: Not on file   Intimate Partner Violence: Unknown (08/29/2017)    Humiliation, Afraid, Rape, and Kick questionnaire     Fear of Current or Ex-Partner: Patient declined     Emotionally Abused: Patient declined     Physically Abused: Patient declined     Sexually Abused: Patient declined   Depression: Not on file   Social Connections: Unknown (08/29/2017)    Social Connection and Isolation Panel [NHANES]     Frequency of Communication with Friends and Family: Patient declined     Frequency of Social Gatherings with Friends and Family: Patient declined     Attends Religious Services: Patient declined     Database administrator or Organizations: Patient declined     Attends Banker Meetings: Patient declined     Marital Status: Patient declined       Would you be willing to receive help with any of the needs that you have identified today? Not applicable       SHIPPING     Specialty Medication(s) to be Shipped:   Inflammatory Disorders: Stelara    Other medication(s) to be shipped: No additional medications requested for fill at this time     Changes to insurance: No    Delivery Scheduled: Yes, Expected medication delivery date: 12/02/2022.     Medication will be delivered via Same Day Courier to the confirmed prescription address in [facility].    The patient will receive a drug information handout for each medication shipped and additional FDA Medication Guides as required.  Verified that patient has previously received a Conservation officer, historic buildings and a Surveyor, mining.    The patient or caregiver noted above participated in the development of this care plan and knows that they can request review of or adjustments to the care plan at any time.      All of the patient's questions and concerns have been addressed.    Elnora Morrison, PharmD   [facility] Specialty Pharmacist

## 2022-12-02 MED FILL — STELARA 45 MG/0.5 ML SUBCUTANEOUS SYRINGE: 84 days supply | Qty: 0.5 | Fill #2

## 2022-12-13 ENCOUNTER — Ambulatory Visit: Admit: 2022-12-13 | Discharge: 2022-12-14 | Payer: PRIVATE HEALTH INSURANCE

## 2022-12-13 DIAGNOSIS — Z79899 Other long term (current) drug therapy: Principal | ICD-10-CM

## 2022-12-13 DIAGNOSIS — L409 Psoriasis, unspecified: Principal | ICD-10-CM

## 2022-12-13 NOTE — Unmapped (Signed)
Dermatology Note    Assessment and Plan:      Psoriasis, 5% BSA involvement, significant improvement on Stelara  - Patient is tolerating well without side-effects.  She is aware of potential side effects including increased risk of infection  - Previously tried [place] and Humira with minimal improvement and concern for increased risk of infection with the Humira. Previously discussed light therapy but not able to do it.  - clobetasol 0.05% ointment BID to thicker plaques until clear. Refilled today.  - clobetasol (TEMOVATE) 0.05 % external solution; Apply topically two (2) times a day. To scalp   - ustekinumab (STELARA) 45 mg/0.5 mL Syrg syringe; Inject the contents of 1 syringe (45 mg) under the skin once every 12 weeks. Sent to [facility].     High Risk Medication, Ustekinumab  - Quant gold within normal limits 06/14/2022  - Discussed extensively that Stelara is an immunosuppressant, will consider stopping Stelara if patient proceeds with a kidney transplant. Daughter may be her donor.  - There are no dosing adjustments in renal failure for ustekinumab per manufacturer's labelling and there are precedents in the literature of use in renal failure. The patient feels she benefits from this medication and we will therefore continue.     The patient was advised to call for an appointment should any new, changing, or symptomatic lesions develop.     RTC: Return in about 6 months (around 06/15/2023) for Recheck. or sooner as needed   _________________________________________________________________      Chief Complaint     Chief Complaint   Patient presents with    Psoriasis     Pt is here today for evaluation of psoriasis. Current flares on both elbows.      Location:both elbows  Current Tx:pt doesn't feel like medication is working like before since the taper down of dosage.   Status:worse            HPI     Amy [name] is a 62 y.o. female who presents as a returning patient (last seen 06/14/2022) for follow up of psoriasis. Patient is pursuing renal [facility]. She still has not been scheduled for it yet. Patient is a little itchy with plaques on her elbows.    The patient denies any other new or changing lesions or areas of concern.     Pertinent Past Medical History     Problem List    None    Past Medical History, Family History, Social History, Medication List, Allergies, and Problem List were reviewed in the rooming section of Epic.     ROS: Other than symptoms mentioned in the HPI, no fevers, chills, or other skin complaints    Physical Examination     GENERAL: Well-appearing female in no acute distress, resting comfortably.  NEURO: Alert and oriented, answers questions appropriately  PSYCH: Normal mood and affect  EYES: lids clear, conjunctiva pink, no scleral icterus or other color change  RESP: No increased work of breathing  SKIN: Examination of the scalp and bilateral upper extremities was performed     - Well circumscribed erythematous papules and plaques on elbows  - Erythema throughout scalp    All areas not commented on are within normal limits or unremarkable    (Approved Template 01/27/2020)

## 2022-12-18 ENCOUNTER — Emergency Department
Admit: 2022-12-18 | Discharge: 2022-12-18 | Disposition: A | Discharge status: Home / Self Care | Payer: PRIVATE HEALTH INSURANCE

## 2022-12-18 ENCOUNTER — Ambulatory Visit
Admit: 2022-12-18 | Discharge: 2022-12-18 | Disposition: A | Discharge status: Home / Self Care | Payer: PRIVATE HEALTH INSURANCE

## 2022-12-18 DIAGNOSIS — S39012A Strain of muscle, fascia and tendon of lower back, initial encounter: Principal | ICD-10-CM

## 2022-12-18 MED ORDER — TIZANIDINE 2 MG TABLET
ORAL_TABLET | Freq: Every day | ORAL | 0 refills | 5 days | Status: CP
Start: 2022-12-18 — End: 2022-12-23

## 2022-12-18 MED ORDER — LIDOCAINE 4 % TOPICAL PATCH
MEDICATED_PATCH | Freq: Every day | TRANSDERMAL | 0 refills | 10 days | Status: CP
Start: 2022-12-18 — End: ?

## 2022-12-19 NOTE — Unmapped (Addendum)
Pt to triage via wc for back spasm radiating from top to bottom of back since Wednesday after attending a stretch class  OTC medications unsuccessful   Pt denies changes in GU/GI  Pt sts pain occurs with movement

## 2022-12-19 NOTE — Unmapped (Signed)
[facility]  Emergency Department Provider Note      ED Clinical Impression      Final diagnoses:   Strain of lumbar region, initial encounter (Primary)            Impression, Medical Decision Making, Progress Notes and Critical Care      Impression, Differential Diagnosis and Plan of Care    Amy [name] is a 62 y.o. female PMH Alport syndrome, CKD, diabetes mellitus, hyperlipidemia, hypertension presenting for left lower back and hip pain for the past 3 to 4 days after recent stretching class.      On exam, pleasant and well-appearing, vital signs are stable.  Mild tenderness to left lumbar region as well as left hip which reproduces her pain.  No red flag symptoms.  Will plan to obtain x-ray for further evaluation rule out sprain strain fracture dislocation but symptoms at this time seem most consistent with a lumbar strain.  No red flag symptoms.    X-ray as reviewed below, arthritis noted without any acute changes.  Symptoms most consistent with sprain strain at this time given reproducibility, history of, alleviating factors.  Discussed supportive care, medications for symptomatic relief.  As she has a history of CKD, did discuss careful use of muscle relaxer, patient does note that she has done well in the past with tizanidine.  Instructed to follow-up with her PCP as needed.  Return for any new or worsening symptoms.  She verbalized understanding and agreement with the plan of care.        Additional Progress Notes         Portions of this record have been created using Scientist, clinical (histocompatibility and immunogenetics). Dictation errors have been sought, but may not have been identified and corrected.    See chart and resident provider documentation for details.    ____________________________________________         History        Reason for Visit  Back Pain      HPI   Amy [name] is a 62 y.o. female PMH Alport syndrome, CKD, diabetes mellitus, hyperlipidemia, hypertension presenting for left lower back and hip pain for the past 3 to 4 days after recent stretching class.  She states that she had never done much of these classes before, the person who is taking care of her during the class was moving her leg around and having her go in various different positions.  Notes that that she felt fine, but began to develop pain over the following few days.  States having dull aching pain in her left lower back that is made worse with movement and palpation and relieved if she lays on her right side or when she is up and walking.  She has been taken Tylenol with minimal relief.  Denies urinary or bowel incontinence or retention, saddle anesthesia, fever, midline back pain, abdominal pain.    Outside Historian(s)  (EMS, Significant Other, Family, Parent, Caregiver, Friend, Patent examiner, etc.)        External Records Reviewed  (Inpatient/Outpatient notes, Prior labs/imaging studies, Care Everywhere, PDMP, External ED notes, etc)        Past Medical History:   Diagnosis Date    Alport syndrome     Back pain     Chronic kidney disease     Dental abscess     Diabetes mellitus (CMS-HCC)     type 2, don't remember A1C, controlled with insulin injections; okay per physician at last visit  Hyperlipidemia     Hypertension     well controlled with meds    Kidney stone     no problems since 1995    Psoriasis        Patient Active Problem List   Diagnosis    Chronic kidney disease, stage 3 (CMS-HCC)    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    Stage 4 chronic kidney disease (CMS-HCC)    Type 2 diabetes mellitus with stage 4 chronic kidney disease, with long-term current use of insulin (CMS-HCC)    Essential hypertension    Hyperlipidemia, unspecified hyperlipidemia type    Tobacco use       Past Surgical History:   Procedure Laterality Date    BREAST BIOPSY Right 05/30/2018    Intraductal papilloma w/ florid DH & apocrine metaplasia    CATARACT EXTRACTION Right 08/29/2017    COLONOSCOPY      cyst removal in breast      HYSTERECTOMY NEPHROSTOMY      OOPHORECTOMY      ONLY ONE OVARY REMOVED    PR COLSC FLX W/RMVL OF TUMOR POLYP LESION SNARE TQ N/A 06/19/2018    Procedure: COLONOSCOPY FLEX; W/REMOV TUMOR/LES BY SNARE;  Surgeon: Jarvis Morgan, MD;  Location: [facility];  Service: Gastroenterology    PR XCAPSL CTRC RMVL INSJ IO LENS PROSTH W/O ECP Right 08/29/2017    Procedure: EXTRACAPSULAR CATARACT REMOVAL W/INSERTION OF INTRAOCULAR LENS PROSTHESIS, MANUAL OR MECHANICAL TECHNIQUE Lens Choice:OD ZCB00 +19.50 [id number] +19.00/+18.00;  Surgeon: O'Rese Bonney Roussel, MD;  Location: [facility] OR [facility];  Service: Ophthalmology    PR XCAPSL CTRC RMVL INSJ IO LENS PROSTH W/O ECP Left 09/19/2017    Procedure: EXTRACAPSULAR CATARACT REMOVAL W/INSERTION OF INTRAOCULAR LENS PROSTHESIS, MANUAL OR MECHANICAL TECHNIQUE Lens Choice: ZCB00 +21.00; ZA9003 +20.00/+19.00;  Surgeon: O'Rese Bonney Roussel, MD;  Location: [facility] OR [facility];  Service: Ophthalmology       No current facility-administered medications for this encounter.    Current Outpatient Medications:     betamethasone dipropionate (DIPROLENE) 0.05 % ointment, , Disp: , Rfl: 4    calcipotriene (DOVONEX) 0.005 % cream, Apply topically Two (2) times a day., Disp: 60 g, Rfl: 2    cholecalciferol, vitamin D3-25 mcg, 1,000 unit,, 25 mcg (1,000 unit) capsule, Take 1 capsule (25 mcg total) by mouth daily., Disp: , Rfl:     clobetasol (TEMOVATE) 0.05 % external solution, Apply topically two (2) times a day. To scalp, Disp: 50 mL, Rfl: 3    clobetasol (TEMOVATE) 0.05 % ointment, Apply to your thick psoriasis areas twice daily until clear, Disp: 60 g, Rfl: 5    DEXCOM G6 SENSOR Devi, , Disp: , Rfl:     empagliflozin (JARDIANCE) 10 mg tablet, Take 1 tablet (10 mg total) by mouth daily., Disp: 90 tablet, Rfl: 0    ferric citrate (AURYXIA) 210 mg iron Tab tablet, Take 1 tablet (210 mg total) by mouth Three (3) times a day with a meal., Disp: 90 tablet, Rfl: 11    furosemide (LASIX) 20 MG tablet, Take 1 tablet (20 mg total) by mouth daily as needed. (Patient not taking: Reported on 02/09/2022), Disp: 15 tablet, Rfl: 11    insulin detemir U-100 (LEVEMIR FLEXPEN) 100 unit/mL (3 mL) injection pen, , Disp: , Rfl:     LANTUS SOLOSTAR U-100 INSULIN 100 unit/mL (3 mL) injection pen, , Disp: , Rfl:     lidocaine 4 % patch, Place 1 patch on  the skin daily., Disp: 10 patch, Rfl: 0    lisinopril (PRINIVIL,ZESTRIL) 10 MG tablet, Take 1 tablet (10 mg total) by mouth daily., Disp: , Rfl:     NOVOLOG FLEXPEN U-100 INSULIN 100 unit/mL (3 mL) injection pen, , Disp: , Rfl:     OZEMPIC 0.25 mg or 0.5 mg (2 mg/3 mL) PnIj, , Disp: , Rfl:     pen needle, diabetic 32 gauge x 5/32 (4 mm) Ndle, Inject as directed Three (3) times a day before meals., Disp: , Rfl:     pregabalin (LYRICA) 25 MG capsule, , Disp: , Rfl:     rosuvastatin (CRESTOR) 10 MG tablet, Take by mouth., Disp: , Rfl:     sodium bicarbonate 650 mg tablet, Take 2 tablets (1,300 mg total) by mouth Two (2) times a day., Disp: 360 tablet, Rfl: 3    SYMBICORT 160-4.5 mcg/actuation inhaler, , Disp: , Rfl:     tizanidine (ZANAFLEX) 2 MG tablet, Take 1 tablet (2 mg total) by mouth in the morning for 5 days., Disp: 5 tablet, Rfl: 0    TRUEPLUS PEN NEEDLE 32 gauge x 5/32 Ndle, , Disp: , Rfl: 10    ustekinumab (STELARA) 45 mg/0.5 mL Syrg syringe, Inject the contents of 1 syringe (45 mg) under the skin once every 12 weeks., Disp: 0.5 mL, Rfl: 4    Allergies  Sulfur-8, Glucophage [metformin], Codeine, and Morphine    Family History   Problem Relation Age of Onset    Kidney disease Mother     Diabetes Mother     Cataracts Mother     Heart attack Father     Cancer Paternal Grandmother     Breast cancer Neg Hx     Colon cancer Neg Hx     Endometrial cancer Neg Hx     Ovarian cancer Neg Hx     Melanoma Neg Hx     Basal cell carcinoma Neg Hx     Squamous cell carcinoma Neg Hx        Social History  Social History     Tobacco Use    Smoking status: Former     Current packs/day: 0.00     Average packs/day: 1 pack/day for 43.0 years (43.0 ttl pk-yrs)     Types: Cigarettes     Start date: 02/12/1978     Quit date: 02/12/2021     Years since quitting: 1.8    Smokeless tobacco: Never   Vaping Use    Vaping status: Every Day   Substance Use Topics    Alcohol use: No    Drug use: No          Physical Exam       ED Triage Vitals   Enc Vitals Group      BP --       Heart Rate 12/18/22 1721 74      SpO2 Pulse --       Resp 12/18/22 1721 20      Temp 12/18/22 1721 36.7 ??C (98.1 ??F)      Temp Source 12/18/22 1721 Oral      SpO2 12/18/22 1721 100 %      Weight 12/18/22 1719 67.1 kg (148 lb)      Height 12/18/22 1719 1.676 m (5' 6)      Head Circumference --       Peak Flow --       Pain Score --       Pain  Loc --       Pain Education --       Exclude from Growth Chart --        Constitutional: Alert and oriented. Well appearing and in no distress.  Eyes: Conjunctivae are normal.  ENT       Head: Normocephalic and atraumatic.       Nose: No congestion.       Mouth/Throat: Mucous membranes are moist.       Neck: No stridor.  Hematological/Lymphatic/Immunilogical: No cervical lymphadenopathy.  Cardiovascular: Normal rate, regular rhythm. Normal and symmetric distal pulses are present in all extremities.  Respiratory: Normal respiratory effort. Breath sounds are normal.  Gastrointestinal: Soft and nontender. There is no CVA tenderness.  Musculoskeletal: Normal range of motion in all extremities.  Mild TTP to left lumbar region as well as left hip.  She is able to ambulate without difficulty but notes increased discomfort.  Neurovascularly intact.       Right lower leg: No tenderness or edema.       Left lower leg: No tenderness or edema.  Neurologic: Normal speech and language. No gross focal neurologic deficits are appreciated.  Skin: Skin is warm, dry and intact. No rash noted.  Psychiatric: Mood and affect are normal. Speech and behavior are normal.       Radiology     XR Trauma Hip Left Final Result   No acute osseous abnormality. Left hip osteoarthrosis, including a large lateral acetabular osteophyte.             Labs     Results for orders placed or performed in visit on 10/19/22   Hematocrit   Result Value Ref Range    HCT 37.8 34.0 - 44.0 %   Ferritin   Result Value Ref Range    Ferritin 40.1 7.3 - 270.7 ng/mL   Iron & TIBC   Result Value Ref Range    Iron 75 50 - 170 ug/dL    TIBC 295 621 - 308 ug/dL    Iron Saturation (%) 26 20 - 55 %   Hemoglobin   Result Value Ref Range    HGB 12.3 11.3 - 14.9 g/dL   Protein/Creatinine Ratio, Urine   Result Value Ref Range    Creat U 56.8 Undefined mg/dL    Protein, Ur 65.7 Undefined mg/dL    Protein/Creatinine Ratio, Urine 1.201 Undefined   Renal Function Panel   Result Value Ref Range    Sodium 143 135 - 145 mmol/L    Potassium 5.1 3.5 - 5.1 mmol/L    Chloride 112 (H) 98 - 107 mmol/L    CO2 22.0 20.0 - 31.0 mmol/L    Anion Gap 9 5 - 14 mmol/L    BUN 65 (H) 9 - 23 mg/dL    Creatinine 8.46 (H) 0.55 - 1.02 mg/dL    BUN/Creatinine Ratio 14     eGFR CKD-EPI (2021) Female 11 (L) >=60 mL/min/1.67m2    Glucose 86 70 - 179 mg/dL    Calcium 9.1 8.7 - 96.2 mg/dL    Phosphorus 6.3 (H) 2.4 - 5.1 mg/dL    Albumin 3.6 3.4 - 5.0 g/dL   Parathyroid Hormone (PTH)   Result Value Ref Range    PTH 260.7 (H) 18.5 - 88.1 pg/mL   Vitamin D 25 Hydroxy (25OH D2 + D3)   Result Value Ref Range    Vitamin D Total (25OH) 33.0 20.0 - 80.0 ng/mL       Pertinent  labs & imaging results that were available during my care of the patient were reviewed by me and considered in my medical decision making (see chart for details).                 Margaret P[address], [place]  12/18/22 [phone]

## 2023-02-01 ENCOUNTER — Ambulatory Visit: Admit: 2023-02-01 | Discharge: 2023-02-02 | Payer: PRIVATE HEALTH INSURANCE

## 2023-02-01 DIAGNOSIS — M546 Pain in thoracic spine: Principal | ICD-10-CM

## 2023-02-01 DIAGNOSIS — S39012A Strain of muscle, fascia and tendon of lower back, initial encounter: Principal | ICD-10-CM

## 2023-02-01 DIAGNOSIS — M545 Thoracolumbar back pain: Principal | ICD-10-CM

## 2023-02-01 MED ORDER — METHYLPREDNISOLONE 4 MG TABLETS IN A DOSE PACK
0 refills | 0 days | Status: CP
Start: 2023-02-01 — End: ?

## 2023-02-01 NOTE — Unmapped (Signed)
ORTHOPAEDIC SPINE CLINIC NOTE       Bertram Millard. Lorin Picket, DNP  Nurse Practitioner  [email or web address]  [phone]        Patient Name:Amy [name]  MRN: [id number]  DOB: 1961/01/27    Date: 02/01/2023    PCP: [name], MD    ASSESSMENT:      1. Strain of lumbar region, initial encounter    2. Thoracolumbar back pain      Amy [name] is a 62 y.o. female with an acute lumbar strain since 12/15/2022.  Pain seems to be well localized along bilateral thoracolumbar paraspinal region.  Physical exam reassuring and unremarkable.    PROM:  MJOA: 18     PLAN and RECOMMENDATIONS:      Options reviewed with patient.  I recommended a home exercise program and we discussed that her symptoms will most likely resolve in 2 to 4 weeks.  Will prescribe a Medrol Dosepak for symptom management.  We will plan on reevaluating if symptoms do not improve as anticipated.  McKenzie lumbar roll demonstrated today and found to be very helpful.    Future Considerations:  -Spine imaging    Patient Instructions   Medications: Take over-the-counter medications as needed and as tolerated  If you don't have liver disease, the safest medication for pain is acetaminophen Extended Release (Tylenol Arthritis).  Take 650mg  (1 extended release tab) at a time for pain relief every 8 hours.  Do not take more than 3000mg  per day.  We have prescribed a Medrol Dosepak.  Take as prescribed.  Activity: Activities as tolerated.  We recommend a McKenzie Lumbar Roll when driving/sitting.  This can be found on [employer] at: [email or web address]  Conservative Care: Home Exercise Program provided today.  Surgical Care: No surgical indications at this time.  Imaging studies: none  Labs: None  Return if symptoms worsen or fail to improve.     Contact our nursing team via MyChart or telephone with any clinical questions/concerns.  Our scheduling team and support staff can be reached at [phone].     No orders of the defined types were placed in this encounter.    Medications Prescribed Today               methylPREDNISolone (MEDROL DOSEPACK) 4 mg tablet follow package directions             SUBJECTIVE:     Chief Complaint:  Chief Complaint   Patient presents with    Back Pain     Went to a stretching class and the next day she got up to use the bathroom and could barely get up off the toilet. Lower back pain with thoracic pain.     Numbness     Intermittent numbness and tingling in both legs.      History of Present Illness:        02/01/23 1359   PainSc: 3      Amy [name] is a 62 y.o. right handed female with a relevant PMH of Alport syndrome, Back pain, Chronic kidney disease, Dental abscess, Diabetes mellitus (CMS-HCC), Hyperlipidemia, Hypertension, Kidney stone, and Psoriasis seen in consultation at the request of Loni Dolly* for evaluation of low back pain, thoracic pain. Date of onset: since 12/15/2022.  Frequency: constant  Modifying factors: worse with laying down, worse with activity, worse with sitting  Associated symptoms: stiffness    Established Patient Interval History (02/01/2023):  Patient last seen 04/13/2020.  Today, patient reports symptoms are gradually  worsening.  Worsening mid and low back pain after attending a stretch class.  Symptoms began the day after doing the exercise program 12/15/2022.  Went to the ED on 12/18/2022 due to severity of back pain, flexeril was sedating and she was unable to take it.  She feels like the pain runs up and down both sides of her mid and low back.  She denies any new radicular pain/paresthesia. Patient denies bowel/bladder changes, denies saddle anesthesia, and denies gait changes.    Recent accidents/injuries/falls? no  Recent Relevant ED/Urgent Care Encounters? yes - 12/18/2022 ED for lumbar strain  New or worsening neurologic symptoms?  No    Functional limitations include: unable to cook/clean, unable to sit for extended periods    Lives in [place].  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    Current Treatments Previous Treatments   Current Relevant Pain Medications:  Acetaminophen 650mg  ER  AED: Lyrica 50 mg QHS    Current Physical Therapy: No recent PT    Adjunct Treatments: Activity Modification    In a Pain Clinic: No Prior Relevant Pain Medications:  NSAID: Ibuprofen 800 mg  MR: Tizanidine, methocarbamol, flexeril     Prior Physical Therapy: None.    Injections:None.    Prior Relevant Surgeries: None.        Medical History   She  has a past medical history of Alport syndrome, Back pain, Chronic kidney disease, Dental abscess, Diabetes mellitus (CMS-HCC), Hyperlipidemia, Hypertension, Kidney stone, and Psoriasis.     Surgical History   She  has a past surgical history that includes Hysterectomy; Nephrostomy; Cataract extraction (Right, 08/29/2017); pr xcapsl ctrc rmvl insj io lens prosth w/o ecp (Right, 08/29/2017); pr xcapsl ctrc rmvl insj io lens prosth w/o ecp (Left, 09/19/2017); pr colsc flx w/rmvl of tumor polyp lesion snare tq (N/A, 06/19/2018); Colonoscopy; cyst removal in breast; Breast biopsy (Right, 05/30/2018); and Oophorectomy.     Allergies   Sulfur-8, Glucophage [metformin], Codeine, and Morphine   Medications   She has a current medication list which includes the following prescription(s): amoxicillin, true metrix glucose test strip, cholecalciferol (vitamin d3-25 mcg (1,000 unit)), clobetasol, clobetasol, empagliflozin, lisinopril, novolog flexpen u-100 insulin, pen needle, diabetic, pregabalin, rosuvastatin, semglee(insulin glarg-yfgn)pen, sodium bicarbonate, symbicort, terbinafine hcl, trueplus pen needle, ustekinumab, betamethasone dipropionate, calcipotriene, dexcom g6 sensor, auryxia, furosemide, lidocaine, methylprednisolone, and ozempic.   Review of Systems A 10-system review was performed by questionnaire and noted in the electronic chart.  Positives noted/discussed.  Balance of systems was negative.    Fever/chills :denies  Bowel/bladder symptoms :denies   Family History Her family history includes Cancer in her paternal grandmother; Cataracts in her mother; Diabetes in her mother; Heart attack in her father; Kidney disease in her mother.     Social History She  reports that she quit smoking about 1 years ago. Her smoking use included cigarettes. She started smoking about 45 years ago. She has a 43 pack-year smoking history. She has never used smokeless tobacco. She reports that she does not drink alcohol and does not use drugs.        Occupational History    Not on file        OBJECTIVE:     PHYSICAL EXAM:  Vitals: Temp 36.1 ??C (97 ??F) (Skin)  - Wt 70.7 kg (155 lb 12.8 oz)  - Breastfeeding No  - BMI 25.15 kg/m??   Appearance: well-nourished and no acute distress   Skin: No cyanosis or clubbing in bilat hands. and No edema  in the feet.  Pulses: 2+ DP pulses bilaterally     Neurologic exam:   Mental Status: Amy [name] is oriented to time, place and person & is alert and cooperative  Motor: Normal bulk and tone without evidence of pronator drift or fasciculations. No abnormal movements.  Gait: normal.      Motor R L  Reflexes R L   IP 5 5  Patellar 1+ 1+   Quad 5 5  Achilles 1+ 1+       Pathologic R L   TA 5 5  Hoffmann's Deferred Deferred   EHL 5 5  Clonus neg. neg.   GS 5 5  Babinski neg. neg.     Sensory: Sensation to light touch intact throughout.    Thoracolumbar Spine Exam:  Inspection: No swelling, erythema, deformity, atrophy or hypertrophy noted  Lumbar Spine ROM: mildly restricted  Spine Palpation: normal skin, diffusely tender to palpation bilateral thoracic and lumbar paraspinal region    Facet Loading Test: Deferred  Nerve Tension Signs:  Deferred         MEDICAL DECISION MAKING    Test Results:  Imaging:   CT Abdomen & Pelvis. [place]. Date:01/2021.  Impression: No listhesis.  Disc osteophyte complex at L4-L5.    C-spine XR. [place]. Date:03/2020.  Impression: Moderate to severe C5-6 and mild C3-4 and C4-5 degenerative disc disease, unchanged.      I, Duke Salvia, NP, personally interpreted the images. The available reports were reviewed.    Labs:  Lab Results   Component Value Date    A1C 6.2 (H) 12/14/2021         Discussion:  Clinical findings, diagnostic/treatment options, and plan were discussed with the patient.  Activities - Advised activities as tolerated, using pain as a guide.  Conservative Care - Options discussed.  Conservative Care - Warning symptoms were discussed.  Medications - risks/benefits of Medrol were discussed.     E&M Coding:    MEDICAL DECISION MAKING (level of service defined by 2/3 elements)     Number/Complexity of Problems Addressed 1 acute, uncomplicated illness or injury (99203/99213)   Amount/Complexity of Data to be Reviewed/Analyzed 2 points: Review prior notes (1 point per unique source); Review test results (1 point per unique test); Order tests (1 point per unique test) (99203/99213)   Risk of Complications/Morbidity/Mortality of Management Prescription Medication (99204/99214)     Or TIME     Total Time for E/M Services on the Date of Encounter --ESTABLISHED PATIENTS--          cc: Loni Dolly*, Aycock, Ngwe Achirimofor, MD     mJOA SCORE   Score   UPPER EXTREMITY MOTOR SUBSCORE (/5) 5 (Description: Normal hand coordination)   LOWER EXTREMITY SUBSCORE (/7) 7 (Description: Normal walking)   UPPER EXTREMITY SENSORY SUBSCORE (/3) 3 (Description: Normal hand sensation)   URINARY FUNCTION SUBSCORE (/3) 3 (Description: Normal urinary function)       TOTAL SCORE (02/01/2023): 18

## 2023-02-01 NOTE — Unmapped (Addendum)
Medications: Take over-the-counter medications as needed and as tolerated  If you don't have liver disease, the safest medication for pain is acetaminophen Extended Release (Tylenol Arthritis).  Take 650mg  (1 extended release tab) at a time for pain relief every 8 hours.  Do not take more than 3000mg  per day.  We have prescribed a Medrol Dosepak.  Take as prescribed.  Activity: Activities as tolerated.  We recommend a McKenzie Lumbar Roll when driving/sitting.  This can be found on [employer] at: [employer]  Conservative Care: Home Exercise Program provided today.  Surgical Care: No surgical indications at this time.  Imaging studies: none  Labs: None  Return if symptoms worsen or fail to improve.     Contact our nursing team via MyChart or telephone with any clinical questions/concerns.  Our scheduling team and support staff can be reached at [phone].

## 2023-02-08 NOTE — Unmapped (Signed)
Patient was notified of operational disruptions. Patient opted to: schedule their refill with understanding of potential delay until 10/1 or later. This was facilitated by pharmacy staff     [facility] Refill Coordination Note    Specialty Medication(s) to be Shipped:   Inflammatory Disorders: Stelara    Other medication(s) to be shipped: No additional medications requested for fill at this time     Amy [name], DOB: December 12, 1960  Phone: [phone] (home)       All above HIPAA information was verified with patient.     Was a Nurse, learning disability used for this call? No    Completed refill call assessment today to schedule patient's medication shipment from the [facility]  [phone]).  All relevant notes have been reviewed.     Specialty medication(s) and dose(s) confirmed: Regimen is correct and unchanged.   Changes to medications: [name] reports no changes at this time.  Changes to insurance: No  New side effects reported not previously addressed with a pharmacist or physician: None reported  Questions for the pharmacist: No    Confirmed patient received a Conservation officer, historic buildings and a Surveyor, mining with first shipment. The patient will receive a drug information handout for each medication shipped and additional FDA Medication Guides as required.       DISEASE/MEDICATION-SPECIFIC INFORMATION        For patients on injectable medications: Patient currently has 0 doses left.  Next injection is scheduled for 10/9.    SPECIALTY MEDICATION ADHERENCE     Medication Adherence    Patient reported X missed doses in the last month: 0  Specialty Medication: STELARA 45 mg/0.5 mL              Were doses missed due to medication being on hold? No    STELARA 45 mg/0.5 mL   : 0 doses of medicine on hand       REFERRAL TO PHARMACIST     Referral to the pharmacist: Not needed      SHIPPING     Shipping address confirmed in Epic.       Delivery Scheduled: Yes, Expected medication delivery date: 10/9.     Medication will be delivered via Same Day Courier to the prescription address in Epic WAM.    Westley Gambles   [facility]  Specialty Technician

## 2023-02-09 ENCOUNTER — Emergency Department
Admit: 2023-02-09 | Discharge: 2023-02-11 | Disposition: A | Discharge status: Home / Self Care | Payer: PRIVATE HEALTH INSURANCE | Attending: Student in an Organized Health Care Education/Training Program

## 2023-02-09 ENCOUNTER — Ambulatory Visit
Admit: 2023-02-09 | Discharge: 2023-02-11 | Disposition: A | Discharge status: Home / Self Care | Payer: PRIVATE HEALTH INSURANCE | Attending: Student in an Organized Health Care Education/Training Program

## 2023-02-09 LAB — COMPREHENSIVE METABOLIC PANEL
ALBUMIN: 3.5 g/dL (ref 3.4–5.0)
ALKALINE PHOSPHATASE: 234 U/L — ABNORMAL HIGH (ref 46–116)
ALT (SGPT): 354 U/L — ABNORMAL HIGH (ref 10–49)
ANION GAP: 9 mmol/L (ref 5–14)
AST (SGOT): 122 U/L — ABNORMAL HIGH (ref <=34)
BILIRUBIN TOTAL: 0.5 mg/dL (ref 0.3–1.2)
BLOOD UREA NITROGEN: 61 mg/dL — ABNORMAL HIGH (ref 9–23)
BUN / CREAT RATIO: 16
CALCIUM: 9.1 mg/dL (ref 8.7–10.4)
CHLORIDE: 105 mmol/L (ref 98–107)
CO2: 24.3 mmol/L (ref 20.0–31.0)
CREATININE: 3.87 mg/dL — ABNORMAL HIGH
EGFR CKD-EPI (2021) FEMALE: 13 mL/min/{1.73_m2} — ABNORMAL LOW (ref >=60)
GLUCOSE RANDOM: 175 mg/dL (ref 70–179)
POTASSIUM: 5 mmol/L — ABNORMAL HIGH (ref 3.4–4.8)
PROTEIN TOTAL: 7.1 g/dL (ref 5.7–8.2)
SODIUM: 138 mmol/L (ref 135–145)

## 2023-02-09 LAB — CBC W/ AUTO DIFF
BASOPHILS ABSOLUTE COUNT: 0 10*9/L (ref 0.0–0.1)
BASOPHILS RELATIVE PERCENT: 0.4 %
EOSINOPHILS ABSOLUTE COUNT: 0.1 10*9/L (ref 0.0–0.5)
EOSINOPHILS RELATIVE PERCENT: 0.5 %
HEMATOCRIT: 37.9 % (ref 34.0–44.0)
HEMOGLOBIN: 12.4 g/dL (ref 11.3–14.9)
LYMPHOCYTES ABSOLUTE COUNT: 2.5 10*9/L (ref 1.1–3.6)
LYMPHOCYTES RELATIVE PERCENT: 23.3 %
MEAN CORPUSCULAR HEMOGLOBIN CONC: 32.7 g/dL (ref 32.0–36.0)
MEAN CORPUSCULAR HEMOGLOBIN: 28.9 pg (ref 25.9–32.4)
MEAN CORPUSCULAR VOLUME: 88.5 fL (ref 77.6–95.7)
MEAN PLATELET VOLUME: 8.1 fL (ref 6.8–10.7)
MONOCYTES ABSOLUTE COUNT: 1.4 10*9/L — ABNORMAL HIGH (ref 0.3–0.8)
MONOCYTES RELATIVE PERCENT: 12.7 %
NEUTROPHILS ABSOLUTE COUNT: 6.7 10*9/L (ref 1.8–7.8)
NEUTROPHILS RELATIVE PERCENT: 63.1 %
NUCLEATED RED BLOOD CELLS: 0 /100{WBCs} (ref <=4)
PLATELET COUNT: 95 10*9/L — ABNORMAL LOW (ref 150–450)
RED BLOOD CELL COUNT: 4.28 10*12/L (ref 3.95–5.13)
RED CELL DISTRIBUTION WIDTH: 15.2 % (ref 12.2–15.2)
WBC ADJUSTED: 10.7 10*9/L (ref 3.6–11.2)

## 2023-02-09 LAB — URINALYSIS WITH MICROSCOPY WITH CULTURE REFLEX PERFORMABLE
BILIRUBIN UA: NEGATIVE
GLUCOSE UA: 500 — AB
KETONES UA: NEGATIVE
LEUKOCYTE ESTERASE UA: NEGATIVE
NITRITE UA: NEGATIVE
PH UA: 6.5 (ref 5.0–9.0)
RBC UA: 2 /HPF (ref <=4)
SPECIFIC GRAVITY UA: 1.006 (ref 1.003–1.030)
SQUAMOUS EPITHELIAL: <1 /HPF (ref 0–5)
UROBILINOGEN UA: <2
WBC UA: <1 /HPF (ref 0–5)

## 2023-02-09 LAB — HIGH SENSITIVITY TROPONIN I - SINGLE
HIGH SENSITIVITY TROPONIN I: 463 ng/L (ref <=34)
HIGH SENSITIVITY TROPONIN I: 423 ng/L (ref <=34)

## 2023-02-09 LAB — HIGH SENSITIVITY TROPONIN I - 2H/6H SERIAL
HIGH SENSITIVITY TROPONIN - DELTA (0-2H): 28 ng/L (ref <=7)
HIGH-SENSITIVITY TROPONIN I - 2 HOUR: 435 ng/L (ref <=34)

## 2023-02-09 LAB — LIPASE: LIPASE: 79 U/L — ABNORMAL HIGH (ref 12–53)

## 2023-02-09 MED ADMIN — alum-mag-simeth (MAALOX PLUS) 200-200-20 mg/5 mL suspension 60 mL: 60 mL | ORAL | @ 23:00:00 | Stop: 2023-02-09

## 2023-02-09 MED ADMIN — sucralfate (CARAFATE) oral suspension: 1 g | ORAL | @ 23:00:00 | Stop: 2023-02-09

## 2023-02-09 MED ADMIN — aspirin tablet 325 mg: 325 mg | ORAL | @ 21:00:00 | Stop: 2023-02-09

## 2023-02-09 NOTE — Unmapped (Signed)
Pt arrives with c/o blurry vision to both eyes with onset this morning.  Pt taking abx for sinus cold. Pt states rib pain in epigastric area.  Pt states pain is in her upper abd when she eats.  Pt c/o increased back pain due to gas and burping often.     BG 146 in triage.  Pt did not take her medications this morning.

## 2023-02-09 NOTE — Unmapped (Signed)
[facility]  Emergency Department Provider Note    ED Clinical Impression     Final diagnoses:   Vision loss of left eye   Epigastric pain (Primary)   NSTEMI (non-ST elevated myocardial infarction) (CMS-HCC)       HPI, ED Course, Assessment and Plan     Initial Clinical Impression:    February 09, 2023 3:12 PM   Amy [name] is a 62 y.o. female with a past medical history of Alport syndrome, T2DM, HTN and HLD presenting with blurred vision. The patient reports waking at 0700 with monocular blurred vision from L eye which she describes as a film over her vision initially that is now fuzzy. No current film over her vision. No flashes of light or floaters. No areas of black spots or clear vision loss in any area. No eye pain. No foreign body sensation, no use of corrective lenses but does use reading glasses. 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 She notes epigastric pain since this morning upon awakening, worsening when she eats and moves around, no clear alleviating factors. Denies numbness, tingling or weakness. Further denies fevers or emesis.  The patient smokes cigarettes approximately 1 pack/day for many years. No cardiac history.     BP 106/63  - Pulse 69  - Temp 36.4 ??C (97.5 ??F) (Oral)  - Resp 13  - SpO2 97%   Initial vital signs are normotensive, nontachycardic, afebrile and saturating well on room air.   Pertinent physical exam: On my initial evaluation, the patient is nontoxic appearing, in no acute distress. Lung sounds remarkable for crackles on L without wheezing. Epigastric tenderness to palpation without guarding or rebound. No CVA tenderness. EOMI. Vision in tact to finger counting. Appears to have diminished peripheral vision in the L eye. Visual Acuity: 20/40 bilaterally; 20/50 R; 20/70 L.    Medical Decision Making  This is a 62 y.o. female with a past medical history of Alport syndrome, T2DM, HTN and HLD presenting with concerns for left eye visual blurring without and epigastric pain of onset this morning. Abdominal exam without peritoneal signs. No evidence of acute abdomen at this time. Well appearing. Given work up have low suspicion for acute hepatobiliary disease (including acute cholecystitis or cholangitis), upper GI bleed, acute pancreatitis, gastric perforation, acute infectious processes (pneumonia, hepatitis, pyelonephritis), atypical appendicitis, vascular catastrophe, bowel obstruction or viscus perforation. Will evaluate for ACS with EKG, troponin.     With respect to patient's nontraumatic painless, unilateral vision loss the initial differential includes retinal detachment, retinal hemorrhage, posterior vitreous detachment, amaurosis fugax, CRAO, CRVO, or CVA. Given vision loss is painless I have low suspicion for normally painful syndromes such as corneal abrasion/ulcer, complex migraine, globe rupture, acute angle closure glaucoma, uveitis, endopthalmitis, iritis.  Vision loss is unilateral with no other focal neuro deficits so doubt stroke. Acuity is decreased on the left however patient reports blurriness rather than field cut and a curtain. Given painless vision loss low suspicion for normally painful syndromes such as corneal abrasion/ulcer, complex migraine, globe rupture, acute angle closure glaucoma, optic neuritis, temporal arteritis, uveitis, endophthalmitis, iritis.     Further ED updates and updates to plan as per ED Course below:    ED Course:  ED Course as of 02/10/23 0017   Thu Feb 09, 2023   1649 Trop  noted to be elevated I have added on repeat and repeat EKG as well- concern for epigastric pain as anginal equivalent for this pt   1715 Repeat EKG with normal sinus rhythm, rate of 64, leftward axis, appropriate intervals, with submillimeter ST depression in T wave inversions in the inferior leads T wave versions noted in leads V1 through V3   1754 Repeat Trope downtrending at 435.   1850 IOP 14 R, 13 on L   1923 No signs of retinal detachment or tear per ophtho who has seen the images. Would need to transfer to main if needed ophtho eval.   2104 In active conversations with the MAO with regard to having this patient admitted to the cardiology team at main such that ophthalmology can evaluate her given her vision concern in setting of NSTEMI.   2152 MAO advises ED to ED transfer, spoke with cardiology fellow and no beds for direct admit   2204 Patient accepted ED to ED       MDM Elements  I have reviewed recent and relevant previous record, including: Outpatient notes - (10/26/22) Nephrology office note to review PMH.  Independent Interpretation of Studies: EKG(s) - normal sinus rhythm, left axis deviation, rate of 72, appropriate intervals, with significant artifact noted however possible ST depression in the inferior leads with T wave version in the inferior and V4 through V6 and X-ray(s) - CXR without acute findings    Diagnostic test(s) considered but not performed: Considered CT head, CTA head/neck however no other deficits, low concern for CVA    Discussion of Management with other Physicians, QHP or Appropriate Source: Consultant - [facility], cardiology fellow, ophthalmology, [facility] Dr. Warren Danes  Escalation of Care including OBS/Admission/Transfer was considered: Pt to be admitted for further management  ____________________________________________    The case was discussed with the attending physician who is in agreement with the above assessment and plan.     Past History     PAST MEDICAL HISTORY/PAST SURGICAL HISTORY:   Past Medical History:   Diagnosis Date    Alport syndrome     Back pain     Chronic kidney disease     Dental abscess     Diabetes mellitus (CMS-HCC)     type 2, don't remember A1C, controlled with insulin injections; okay per physician at last visit    Hyperlipidemia     Hypertension     well controlled with meds    Kidney stone     no problems since 1995    Psoriasis        Past Surgical History:   Procedure Laterality Date    BREAST BIOPSY Right 05/30/2018    Intraductal papilloma w/ florid DH & apocrine metaplasia    CATARACT EXTRACTION Right 08/29/2017    COLONOSCOPY      cyst removal in breast      HYSTERECTOMY      NEPHROSTOMY      OOPHORECTOMY      ONLY ONE OVARY REMOVED    PR COLSC FLX W/RMVL OF TUMOR POLYP LESION SNARE TQ N/A 06/19/2018    Procedure: COLONOSCOPY FLEX; W/REMOV TUMOR/LES BY SNARE;  Surgeon: Jarvis Morgan, MD;  Location: [facility];  Service: Gastroenterology    PR XCAPSL CTRC RMVL INSJ IO LENS PROSTH W/O ECP Right 08/29/2017    Procedure: EXTRACAPSULAR CATARACT REMOVAL W/INSERTION OF INTRAOCULAR LENS PROSTHESIS, MANUAL OR MECHANICAL TECHNIQUE Lens Choice:OD ZCB00 +19.50 [id number] +19.00/+18.00;  Surgeon: O'Rese Bonney Roussel, MD;  Location:  [facility];  Service: Ophthalmology    PR XCAPSL CTRC RMVL INSJ IO LENS PROSTH W/O ECP Left 09/19/2017    Procedure: EXTRACAPSULAR CATARACT REMOVAL W/INSERTION OF INTRAOCULAR LENS PROSTHESIS, MANUAL OR MECHANICAL TECHNIQUE Lens Choice: ZCB00 +21.00; ZA9003 +20.00/+19.00;  Surgeon: O'Rese Bonney Roussel, MD;  Location: [facility] OR [facility];  Service: Ophthalmology       MEDICATIONS:   No current facility-administered medications for this encounter.    Current Outpatient Medications:     amoxicillin (AMOXIL) 500 MG capsule, , Disp: , Rfl:     betamethasone dipropionate (DIPROLENE) 0.05 % ointment, , Disp: , Rfl: 4    blood sugar diagnostic (TRUE METRIX GLUCOSE TEST STRIP) Strp, True Metrix Glucose Test Strip, Disp: , Rfl:     calcipotriene (DOVONEX) 0.005 % cream, Apply topically Two (2) times a day. (Patient not taking: Reported on 02/01/2023), Disp: 60 g, Rfl: 2    cholecalciferol, vitamin D3-25 mcg, 1,000 unit,, 25 mcg (1,000 unit) capsule, Take 1 capsule (25 mcg total) by mouth daily., Disp: , Rfl:     clobetasol (TEMOVATE) 0.05 % external solution, Apply topically two (2) times a day. To scalp, Disp: 50 mL, Rfl: 3    clobetasol (TEMOVATE) 0.05 % ointment, Apply to your thick psoriasis areas twice daily until clear, Disp: 60 g, Rfl: 5    DEXCOM G6 SENSOR Devi, , Disp: , Rfl:     empagliflozin (JARDIANCE) 10 mg tablet, Take 1 tablet (10 mg total) by mouth daily., Disp: 90 tablet, Rfl: 0    ferric citrate (AURYXIA) 210 mg iron Tab tablet, Take 1 tablet (210 mg total) by mouth Three (3) times a day with a meal. (Patient not taking: Reported on 02/01/2023), Disp: 90 tablet, Rfl: 11    furosemide (LASIX) 20 MG tablet, Take 1 tablet (20 mg total) by mouth daily as needed. (Patient not taking: Reported on 02/09/2022), Disp: 15 tablet, Rfl: 11    lidocaine 4 % patch, Place 1 patch on the skin daily. (Patient not taking: Reported on 02/01/2023), Disp: 10 patch, Rfl: 0    lisinopril (PRINIVIL,ZESTRIL) 10 MG tablet, Take 1 tablet (10 mg total) by mouth daily., Disp: , Rfl:     methylPREDNISolone (MEDROL DOSEPACK) 4 mg tablet, follow package directions, Disp: 1 each, Rfl: 0    NOVOLOG FLEXPEN U-100 INSULIN 100 unit/mL (3 mL) injection pen, , Disp: , Rfl:     OZEMPIC 0.25 mg or 0.5 mg (2 mg/3 mL) PnIj, , Disp: , Rfl:     pen needle, diabetic 32 gauge x 5/32 (4 mm) Ndle, Inject as directed Three (3) times a day before meals., Disp: , Rfl:     pregabalin (LYRICA) 25 MG capsule, , Disp: , Rfl:     rosuvastatin (CRESTOR) 10 MG tablet, Take by mouth., Disp: , Rfl:     SEMGLEE,INSULIN GLARG-YFGN,PEN 100 unit/mL (3 mL) InPn, , Disp: , Rfl:     SYMBICORT 160-4.5 mcg/actuation inhaler, , Disp: , Rfl:     terbinafine HCL (LAMISIL) 250 mg tablet, Take by mouth., Disp: , Rfl:     TRUEPLUS PEN NEEDLE 32 gauge x 5/32 Ndle, , Disp: , Rfl: 10    ustekinumab (STELARA) 45 mg/0.5 mL Syrg syringe, Inject the contents of 1 syringe (45 mg) under the skin once every 12 weeks., Disp: 0.5 mL, Rfl: 4    ALLERGIES:   Sulfur-8, Glucophage [metformin], Codeine, and Morphine    SOCIAL HISTORY:   Social History     Tobacco  Use    Smoking status: Former     Current packs/day: 0.00     Average packs/day: 1 pack/day for 43.0 years (43.0 ttl pk-yrs)     Types: Cigarettes     Start date: 02/12/1978     Quit date: 02/12/2021     Years since quitting: 1.9    Smokeless tobacco: Never   Substance Use Topics    Alcohol use: No       FAMILY HISTORY:  Family History   Problem Relation Age of Onset    Kidney disease Mother     Diabetes Mother     Cataracts Mother     Heart attack Father     Cancer Paternal Grandmother     Breast cancer Neg Hx     Colon cancer Neg Hx     Endometrial cancer Neg Hx     Ovarian cancer Neg Hx     Melanoma Neg Hx     Basal cell carcinoma Neg Hx     Squamous cell carcinoma Neg Hx           Review of Systems   A review of systems was performed and relevant portions were as noted above in HPI     Physical Exam     VITAL SIGNS:    BP 106/63  - Pulse 69  - Temp 36.4 ??C (97.5 ??F) (Oral)  - Resp 13  - SpO2 97%     Constitutional:   Alert and oriented. In no acute distress.  Head:   Normocephalic and atraumatic  Eyes:   Conjunctivae are normal, EOMI, PERRL. Vision in tact to finger counting in all quadrants. Visual Acuity: 20/40 bilaterally; 20/50 R; 20/70 L.  ENT:   No notable congestion, mucous membranes moist, external ears normal, no notable stridor  Cardiovascular:   Rate as vitals above. Appears warm and well perfused  Respiratory:   Normal respiratory effort. Lung sounds are remarkable for mild crackles on L without wheezing.  Gastrointestinal:   Soft, non-distended. Epigastric tenderness to palpation without guarding or rebound. No CVA tenderness.  Genitourinary:   Deferred  Musculoskeletal:    Normal range of motion in all extremities. No tenderness or edema noted in B/L lower extremities  Neurologic:   No gross focal neurologic deficits beyond baseline are appreciated. Alert and oriented to person, place, and time. Speech intact to conversation without evidence of expressive or receptive aphasia. CN II-XII tested and intact, with no more than end-gaze nystagmus, face symmetric at rest and with activation, midline tongue protrusion. Sensation to light touch and motor function of bilateral upper and lower extremities grossly intact. No pronator drift. Ambulates with steady gait without evidence of ataxia.   Skin:   Skin is warm, dry and intact    Radiology     XR Chest 2 views   Final Result      No acute cardiopulmonary abnormalities.      ED POCUS    (Results Pending)       Labs     Labs Reviewed   COMPREHENSIVE METABOLIC PANEL - Abnormal; Notable for the following components:       Result Value    Potassium 5.0 (*)     BUN 61 (*)     Creatinine 3.87 (*)     eGFR CKD-EPI (2021) Female 13 (*)     AST 122 (*)     ALT 354 (*)     Alkaline Phosphatase 234 (*)  All other components within normal limits   LIPASE - Abnormal; Notable for the following components:    Lipase 79 (*)     All other components within normal limits   HIGH SENSITIVITY TROPONIN I - SINGLE - Abnormal; Notable for the following components:    hsTroponin I 463 (*)     All other components within normal limits   HIGH SENSITIVITY TROPONIN I - 2H/6H SERIAL - Abnormal; Notable for the following components:    hsTroponin I 435 (*)     delta hsTroponin I 28 (*)     All other components within normal limits   HIGH SENSITIVITY TROPONIN I - SINGLE - Abnormal; Notable for the following components:    hsTroponin I 423 (*)     All other components within normal limits   CBC W/ AUTO DIFF - Abnormal; Notable for the following components:    Platelet 95 (*)     Absolute Monocytes 1.4 (*)     All other components within normal limits   URINALYSIS WITH MICROSCOPY WITH CULTURE REFLEX PERFORMABLE - Abnormal; Notable for the following components:    Protein, UA Trace (*)     Glucose, UA 500 mg/dL (*)     Blood, UA Small (*)     Bacteria, UA Rare (*)     All other components within normal limits   INFLUENZA/RSV/COVID PCR - Normal Narrative:     This test was performed using the Cepheid Xpert Xpress SARS-CoV-2/Flu/RSV plus assay, which has been validated by the CLIA-certified, CAP-inspected [facility]. FDA has granted Emergency Use Authorization for this test. Negative results do not preclude infection and should be interpreted along with clinical observations, patient history, and epidemiological information. Information for providers and patients can be found here: [email or web address]   POCT GLUCOSE, INTERFACED - Normal   CBC W/ DIFFERENTIAL    Narrative:     The following orders were created for panel order CBC w/ Differential.                  Procedure                               Abnormality         Status                                     ---------                               -----------         ------                                     CBC w/ [id number]]         Abnormal            Final result                                                 Please view results for these tests on the individual orders.   URINALYSIS WITH MICROSCOPY WITH CULTURE REFLEX  Narrative:     The following orders were created for panel order Urinalysis with Microscopy with Culture Reflex.                  Procedure                               Abnormality         Status                                     ---------                               -----------         ------                                     Urinalysis with Microsc.Marland KitchenMarland Kitchen[[id number]]  Abnormal            Final result                                                 Please view results for these tests on the individual orders.   HIGH SENSITIVITY TROPONIN I - 4 HOUR SERIAL       Pertinent labs & imaging results that were available during my care of the patient were reviewed by me and considered in my medical decision making. Labs and radiology studies included in my note may not constitute all ordered/reviewied labs during this encounter (see chart for details).      Please note- This chart has been created using [employer]. Chart creation errors have been sought, but may not always be located and such creation errors, especially pronoun confusion, do NOT reflect on the standard of medical care.    Documentation assistance was provided by Kela Millin, Scribe, on February 09, 2023 at 3:12 PM for Marlyn Corporal, MD    February 10, 2023 12:16 AM. Documentation assistance provided by the scribe. I was present during the time the encounter was recorded. The information recorded by the scribe was done at my direction and has been reviewed and validated by me.        Ripley Fraise, MD  Resident  02/10/23 [phone]

## 2023-02-10 LAB — CBC W/ AUTO DIFF
BASOPHILS ABSOLUTE COUNT: 0 10*9/L (ref 0.0–0.1)
BASOPHILS RELATIVE PERCENT: 0.4 %
EOSINOPHILS ABSOLUTE COUNT: 0 10*9/L (ref 0.0–0.5)
EOSINOPHILS RELATIVE PERCENT: 0.6 %
HEMATOCRIT: 39.2 % (ref 34.0–44.0)
HEMOGLOBIN: 13 g/dL (ref 11.3–14.9)
LYMPHOCYTES ABSOLUTE COUNT: 2.4 10*9/L (ref 1.1–3.6)
LYMPHOCYTES RELATIVE PERCENT: 27.9 %
MEAN CORPUSCULAR HEMOGLOBIN CONC: 33.2 g/dL (ref 32.0–36.0)
MEAN CORPUSCULAR HEMOGLOBIN: 29.3 pg (ref 25.9–32.4)
MEAN CORPUSCULAR VOLUME: 88.2 fL (ref 77.6–95.7)
MEAN PLATELET VOLUME: 8.9 fL (ref 6.8–10.7)
MONOCYTES ABSOLUTE COUNT: 1 10*9/L — ABNORMAL HIGH (ref 0.3–0.8)
MONOCYTES RELATIVE PERCENT: 11.1 %
NEUTROPHILS ABSOLUTE COUNT: 5.1 10*9/L (ref 1.8–7.8)
NEUTROPHILS RELATIVE PERCENT: 60 %
PLATELET COUNT: 95 10*9/L — ABNORMAL LOW (ref 150–450)
RED BLOOD CELL COUNT: 4.44 10*12/L (ref 3.95–5.13)
RED CELL DISTRIBUTION WIDTH: 14.6 % (ref 12.2–15.2)
WBC ADJUSTED: 8.6 10*9/L (ref 3.6–11.2)

## 2023-02-10 LAB — PROTIME-INR
INR: 0.95
PROTIME: 10.7 s (ref 9.9–12.6)

## 2023-02-10 LAB — COMPREHENSIVE METABOLIC PANEL
ALBUMIN: 3.6 g/dL (ref 3.4–5.0)
ALKALINE PHOSPHATASE: 271 U/L — ABNORMAL HIGH (ref 46–116)
ALT (SGPT): 263 U/L — ABNORMAL HIGH (ref 10–49)
ANION GAP: 6 mmol/L (ref 5–14)
AST (SGOT): 72 U/L — ABNORMAL HIGH (ref <=34)
BILIRUBIN TOTAL: 0.6 mg/dL (ref 0.3–1.2)
BLOOD UREA NITROGEN: 63 mg/dL — ABNORMAL HIGH (ref 9–23)
BUN / CREAT RATIO: 15
CALCIUM: 9.4 mg/dL (ref 8.7–10.4)
CHLORIDE: 110 mmol/L — ABNORMAL HIGH (ref 98–107)
CO2: 19 mmol/L — ABNORMAL LOW (ref 20.0–31.0)
CREATININE: 4.17 mg/dL — ABNORMAL HIGH
EGFR CKD-EPI (2021) FEMALE: 12 mL/min/{1.73_m2} — ABNORMAL LOW (ref >=60)
GLUCOSE RANDOM: 234 mg/dL — ABNORMAL HIGH (ref 70–179)
POTASSIUM: 5.9 mmol/L — ABNORMAL HIGH (ref 3.4–4.8)
PROTEIN TOTAL: 7.6 g/dL (ref 5.7–8.2)
SODIUM: 135 mmol/L (ref 135–145)

## 2023-02-10 LAB — MAGNESIUM: MAGNESIUM: 3.2 mg/dL — ABNORMAL HIGH (ref 1.6–2.6)

## 2023-02-10 LAB — PHOSPHORUS: PHOSPHORUS: 5.3 mg/dL — ABNORMAL HIGH (ref 2.4–5.1)

## 2023-02-10 LAB — HEMOGLOBIN A1C
ESTIMATED AVERAGE GLUCOSE: 163 mg/dL
HEMOGLOBIN A1C: 7.3 % — ABNORMAL HIGH (ref 4.8–5.6)

## 2023-02-10 MED ORDER — ASPIRIN 81 MG TABLET,DELAYED RELEASE
ORAL_TABLET | Freq: Every day | ORAL | 0 refills | 30 days | Status: CP
Start: 2023-02-10 — End: 2023-03-12

## 2023-02-10 MED ADMIN — nicotine (NICODERM CQ) 7 mg/24 hr patch 1 patch: 1 | TRANSDERMAL | @ 16:00:00

## 2023-02-10 MED ADMIN — gadopiclenol injection 7 mL: 7 mL | INTRAVENOUS | @ 22:00:00 | Stop: 2023-02-10

## 2023-02-10 NOTE — Unmapped (Addendum)
Ophthalmology Consult Note      Requesting Attending Physician:   Service Requesting Consult: Emergency Medicine   Consult Attending Physician: Dr. Margaree Mackintosh    Assessment:  Amy [name] is a 62 y.o. female with a past medical history of Alport syndrome, T2DM, HTN and HLD presenting with transient blurred vision left eye.      Ophthalmology was consulted for assistance with evaluation and management.    # Suspected TVOs, OS:  - had half a day of transient visual blurriness left eye   - no associated headache/weakness/numbness  - VA PH 20/25, iop wnl, and vision sx have resolved, suspect possible TVO  - given vascular risk factors and cf pancreatic neoplasm, suspect vascular/neuro pathology  - DDx is broad and includes GCA (age >52yrs), NAION (vascular risk factors), vetebral artery dissection (vascular risk factors), retinal emboli (carotid or cardiogenic), ocular migraine (typically younger patients), vertebrobasilar insufficiency (typically bilateral sx)  - Pt denies headache, jaw claudication, scalp tenderness, and fever  - Pt does not have temporal tenderness, absent temporal artery pulse, and enlarged/ropey temporal artery  - Fundus exam: wnl  - ONH: negative edema, negative pallor, negative disc hemorrhages  - Fellow eye w/ 0.4 C/D  - negative disc at risk  - Pt denies phosphodiesterase inhibitors  - CT Head negative for acute bleed    - CRP/ESR unavailable/ Platelets 95    #Pseudophakia both eyes   - stable, no obvious pco, good positioning  - may have mild refractive error, would follow up outpatient with local ophthalmologist      Plan:  - assessment of vascular risk w/ A1c and lipid panel  - recommend MRI head and MRA head and neck   - obtain csr/crp but low concern for GCA  - if workup is negative and sx are recurrent, can consider outpatient labs for cancer associated retinopathy     Follow up:   - We will arrange ophthalmology f/u at [facility] as needed, as patient wanted to follow up with local ophthalmologist in [place]. [facility], [address] ([address], intersection [address]) can be reached at [phone].  - If She is able to arrange f/u with a local ophthalmologist in a similar time frame, that is also acceptable.   - Thank you for this consult. Please page on-call or consult resident with any questions. We will sign off. Please do not hesitate to page ophthalmology for further concerns/questions. .    Discussed with Dr. Margaree Mackintosh  __________________________________________________________________    Reason for Consult:  Temporary blurred vision left eye.     History of Present Illness:  Amy [name] is a 62 y.o. female whom we are asked to see in consultation for transient blurred vision left eye.    Amy [name] is a 62 y.o. female with a past medical history of Alport syndrome, T2DM, HTN and HLD presenting with blurred vision. The patient reports waking at 0700 with monocular blurred vision from L eye which she describes as a film over her vision initially that is now fuzzy. No current film over her vision. No flashes of light or floaters. No areas of black spots or clear vision loss in any area. No eye pain. No foreign body sensation, no use of corrective lenses but does use reading glasses. 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 She notes epigastric pain since this morning upon awakening, worsening when she eats and moves around, no clear alleviating factors. Denies numbness, tingling or weakness. Further denies fevers or emesis.  The patient smokes cigarettes approximately 1 pack/day for many years. No cardiac history.     Denies headache/scalp pain/fever/chills/joint pain/jaw claudication sx.     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    Stage 4 chronic kidney disease (CMS-HCC)    Type 2 diabetes mellitus with stage 4 chronic kidney disease, with long-term current use of insulin (CMS-HCC)    Essential hypertension    Hyperlipidemia, unspecified hyperlipidemia type    Tobacco use       History:    Past Ocular History:  Negative    Past Medical History:  Past Medical History:   Diagnosis Date    Alport syndrome     Back pain     Chronic kidney disease     Dental abscess     Diabetes mellitus (CMS-HCC)     type 2, don't remember A1C, controlled with insulin injections; okay per physician at last visit    Hyperlipidemia     Hypertension     well controlled with meds    Kidney stone     no problems since 1995    Psoriasis        Past Surgical History:  Past Surgical History:   Procedure Laterality Date    BREAST BIOPSY Right 05/30/2018    Intraductal papilloma w/ florid DH & apocrine metaplasia    CATARACT EXTRACTION Right 08/29/2017    COLONOSCOPY      cyst removal in breast      HYSTERECTOMY      NEPHROSTOMY      OOPHORECTOMY      ONLY ONE OVARY REMOVED    PR COLSC FLX W/RMVL OF TUMOR POLYP LESION SNARE TQ N/A 06/19/2018    Procedure: COLONOSCOPY FLEX; W/REMOV TUMOR/LES BY SNARE;  Surgeon: Jarvis Morgan, MD;  Location: [facility];  Service: Gastroenterology    PR XCAPSL CTRC RMVL INSJ IO LENS PROSTH W/O ECP Right 08/29/2017    Procedure: EXTRACAPSULAR CATARACT REMOVAL W/INSERTION OF INTRAOCULAR LENS PROSTHESIS, MANUAL OR MECHANICAL TECHNIQUE Lens Choice:OD ZCB00 +19.50 [id number] +19.00/+18.00;  Surgeon: O'Rese Bonney Roussel, MD;  Location: [facility] OR [facility];  Service: Ophthalmology    PR XCAPSL CTRC RMVL INSJ IO LENS PROSTH W/O ECP Left 09/19/2017    Procedure: EXTRACAPSULAR CATARACT REMOVAL W/INSERTION OF INTRAOCULAR LENS PROSTHESIS, MANUAL OR MECHANICAL TECHNIQUE Lens Choice: ZCB00 +21.00; ZA9003 +20.00/+19.00;  Surgeon: O'Rese Bonney Roussel, MD;  Location: [facility] OR [facility];  Service: Ophthalmology       Family History:  Negative family ocular history    Social History:  Social History     Socioeconomic History    Marital status: Single   Tobacco Use    Smoking status: Former     Current packs/day: 0.00     Average packs/day: 1 pack/day for 43.0 years (43.0 ttl pk-yrs)     Types: Cigarettes     Start date: 02/12/1978     Quit date: 02/12/2021     Years since quitting: 2.0    Smokeless tobacco: Never   Vaping Use    Vaping status: Every Day   Substance and Sexual Activity  Alcohol use: No    Drug use: No    Sexual activity: Not Currently   Other Topics Concern    Do you use sunscreen? No    Tanning bed use? Yes     Comment: USED IN PAST    Are you easily burned? No    Excessive sun exposure? No    Blistering sunburns? Yes     Social Determinants of Health     Financial Resource Strain: Unknown (08/29/2017)    Overall Financial Resource Strain (CARDIA)     Difficulty of Paying Living Expenses: Patient declined   Food Insecurity: Unknown (08/29/2017)    Hunger Vital Sign     Worried About Running Out of Food in the Last Year: Patient declined     Ran Out of Food in the Last Year: Patient declined   Transportation Needs: Unknown (08/29/2017)    PRAPARE - Therapist, art (Medical): Patient declined     Lack of Transportation (Non-Medical): Patient declined   Physical Activity: Unknown (08/29/2017)    Exercise Vital Sign     Days of Exercise per Week: Patient declined     Minutes of Exercise per Session: Patient declined   Social Connections: Unknown (08/29/2017)    Social Connection and Isolation Panel [NHANES]     Frequency of Communication with Friends and Family: Patient declined     Frequency of Social Gatherings with Friends and Family: Patient declined     Attends Religious Services: Patient declined     Database administrator or Organizations: Patient declined     Attends Banker Meetings: Patient declined     Marital Status: Patient declined Unable to assess tobacco use    Medications:  Scheduled Meds:  Continuous Infusions:  PRN Meds:.    Allergies:  Allergies   Allergen Reactions    Sulfur-8 Nausea Only    Glucophage [Metformin]     Codeine Itching    Morphine Itching       Review of Systems:  12 systems reviewed and negative unless otherwise stated in HPI or recent HPI    Physical Exam:  Temp Readings from Last 2 Encounters:   02/10/23 36.5 ??C (97.7 ??F) (Oral)   02/01/23 36.1 ??C (97 ??F) (Skin)     BP Readings from Last 2 Encounters:   02/10/23 120/64   10/26/22 104/53     Pulse Readings from Last 2 Encounters:   02/10/23 66   12/18/22 74     Resp Readings from Last 2 Encounters:   02/10/23 18   12/18/22 20     SpO2 Readings from Last 1 Encounters:   02/10/23 98%       General:   No acute distress    Neuro/Psych:  Alert and oriented to person, place, and time    Ophthalmic Exam:  Base Eye Exam       Visual Acuity Kae Heller Near)         Right Left    Dist cc 20/30 20/30    Dist ph cc 20/25 20/25              Tonometry (Tonopen, 1:03 PM)         Right Left    Pressure 16 13              Pupils         Pupils Shape React APD    Right PERRL Round Brisk None    Left  PERRL Round Brisk None              Visual Fields         Left Right      Full    Restrictions Partial outer superior nasal, inferior nasal deficiencies    Initially had IT VF defect but full post dilation             Extraocular Movement         Right Left     Full, Ortho Full, Ortho              Neuro/Psych       Oriented x3: Yes    Mood/Affect: Normal              Dilation       Both eyes: 1% Tropicamide, 2.5% Phenylephrine @ 1:00 PM                  Additional Tests       Color         Right Left    Eye Handbook App 10/10 10/10                  Slit Lamp and Fundus Exam       External Exam         Right Left    External Normal Normal              Slit Lamp Exam         Right Left    Lids/Lashes dermatochalasis dermatochalasis    Conjunctiva/Sclera mild injection mild injection Cornea Clear, no staining Clear, no staining    Anterior Chamber Deep and quiet Deep and quiet    Iris Flat, temporal surgical TID flat, , temporal surgical TID    Lens PC/IOL. Centered. Stable. PC/IOL. Centered. Stable.    Anterior Vitreous Normal Normal              Fundus Exam         Right Left    Disc Healthy Neuroretinal Rim Healthy Neuroretinal Rim    C/D Ratio 0.4 0.4    Macula Flat Flat    Vessels Normal Normal    Periphery No H/T/D No H/T/D                    Diagnostic Testing:  All pertinent labs and imaging results reviewed    __________________________________________________________________

## 2023-02-10 NOTE — Unmapped (Signed)
Bed: 22-B  Expected date:   Expected time:   Means of arrival:   Comments:  [facility] NSTEMI

## 2023-02-10 NOTE — Unmapped (Signed)
Ophthalmology was paged about this patient having new onset blurry vision this morning that has been improving throughout the day. Primary team was asking if this patient needs to be seen by ophthalmology. She was diagnosed with elevated troponins in the [facility] ED. Primary team did bedside pocus of both eyes that does not show any signs of retinal detachment or tears. Explained to primary team that despite that cannot diagnose or determine need of evaluation over the phone. If the primary team feels this patient needs to be seen by ophthalmology they would need to be transferred to [facility]. Otherwise offered to follow up with patient in clinic after discharge. Regardless recommend that NSTEMI/cardiac problems be evaluated and treated and are of greater importance. Do not recommend ophthalmology evaluation to delay such care.    Kathryne Eriksson MD  PGY 2  [facility] Ophthalmology  [facility]

## 2023-02-10 NOTE — Unmapped (Signed)
[facility] Emergency Department Progress Note      Patient was accepted as a transfer from [facility] to be evaluated by ophthalmology.  In short the patient presented to the [facility] emergency department initially with blurred vision described as monocular blurred vision in the left eye.  She also had epigastric tenderness on physical exam.  Vital signs were within normal limits.  She had decreased visual acuity in the left eye compared to the right.  On her workup she had elevated troponins that slowly downtrended.  This was discussed with cardiology who dropped a note stating that there was concern for acute coronary syndrome is extremely low.  She has nonischemic EKGs.  She was discussed with ophthalmology at [facility] who stated that they would need to evaluate her in the emergency department here and this is why she was transferred.    Upon arrival I evaluated the patient.  Her vision is grossly intact.  She does have epigastric tenderness to palpation.  Upon further review the patient's liver enzymes were elevated and this is not a baseline finding for her.  Given elevated liver enzymes and epigastric tenderness we will obtain a right upper quadrant ultrasound.  I will page ophthalmology to evaluate the patient's vision changes.    ED Course as of 02/10/23 1454   Fri Feb 10, 2023   1036 Radiology has reviewed the patient's ultrasound and is concern for possible pancreatic malignancy.  They recommend MRI for further evaluation.  I have ordered the MRI as well as an MRI of the brain given her changes in vision and concern for possible metastases to the brain.   1454 Patient will be signed out to oncoming provider pending MRI studies.       Additional Medical Decision Making

## 2023-02-10 NOTE — Unmapped (Signed)
Cardiology Treatment Plan    Cardiology was consulted for elevated troponin.     Patient presented with acute onset blurred vision in L eye. She also reported epigastric pain since awakening that worsens when she eats and moves around. Troponin was checked given her epigastric pain.     Hstrop: 463 > 435 > 423. EKG without acute ischemic changes.    Of note, AST/ALT/Alk Phos/Lipase are all elevated.    There is no evidence of ACS. Chest pain is not typical of angina. No EKG changes. Troponin is flat. Her troponin elevation is consistent with acute myocardial injury from some other event. Possibly related to the cause of her acute vision changes. Could also be related to GI/biliary pathology. Her pain that worsens with eating coinciding with elevated LFTs and lipase is concerning for biliary/pancreatic pathology.    I spoke to primary team and MAO and confirmed this was not ACS, and this patient did not need admission to cardiology. Cardiology can consult on this patient for elevated troponin once she is admitted to a medicine team if primary team would like a consult. If she remains at [facility], cardiology can consult there as well.    This patient was at [facility] and I was unable to physically examine the patient. This is not a formal consult.    I discussed the plan with the primary team via phone call.      Suanne Marker, MD  Cardiovascular Fellow, PGY5  Division of Cardiology

## 2023-02-10 NOTE — Unmapped (Signed)
Emergency Medicine Access Physician [facility])  [facility] - Transfer Request Note    Requesting Provider: [facility]: [facility]    Requesting Service: Emergency Medicine    Reason for transfer: Ocular disturbance and NSTEMI    Overview of ED course at transferring hospital: Amy [name] is a 62 y.o. female with a past medical history of Alport syndrome, T2DM, HTN and HLD presenting with blurred vision and gastric pain.  Patient found to have an NSTEMI with decreasing troponins and stable EKGs.  There is concern for possible vitreous hemorrhage and after consultation with ophthalmology they would like to see the patient in the emergency department for evaluation.  They are unable to see her at [facility] if she is admitted there as an inpatient.  The cardiology fellow was also consulted who recommended admission to the med C service.    Was the patient accepted to the [facility] ED in transfer: Yes.  If no, why?: N/A    Accepting service/expected service information:  Has an inpatient or consulting service been contacted by the [facility], [facility] or the referring provider?: Yes.  If admitting/consult service contacted by [facility]:  Time of discussion: Contacted outside of [facility] by the referring provider  Name/service (e.g. Dr. Floyde Parkins, neprology) of inpatient team member/consultant: N/A  Role of admit/consult service member: N/A  Brief summary of call: N/A  Anticipated Inpatient Bed Type Needed: N/A    Outside Hospital Imaging:  Was Imaging Done at the Referring Hospital:  Yes.  If yes, what studies?:  CXR  [employer]:  [employer], images available in [employer]

## 2023-02-11 MED ADMIN — nicotine (NICODERM CQ) 21 mg/24 hr patch 1 patch: 1 | TRANSDERMAL

## 2023-02-11 MED ADMIN — sodium zirconium cyclosilicate (LOKELMA) packet 10 g: 10 g | ORAL | @ 02:00:00 | Stop: 2023-02-10

## 2023-02-11 NOTE — Unmapped (Signed)
[facility] ED Provider Progress Note      ED FINAL IMPRESSION:     Final diagnoses:   Vision loss of left eye   Epigastric pain   NSTEMI (non-ST elevated myocardial infarction) (CMS-HCC)   Malignant neoplasm of pancreas, unspecified location of malignancy (CMS-HCC) (Primary)       ENCOUNTER SUMMARY & PLAN:     TIME OF CARE ASSUMED: February 11, 2023 3:37 AM    PREVIOUS PROVIDER: Veda Canning MD    Please see below for encounter summary as well as pending items.      ED COURSE:     Vitals:    02/10/23 1846 02/10/23 2015 02/10/23 2117 02/10/23 2234   BP: 131/65 111/57 126/79 120/64   Pulse: 66 65  66   Resp: 14   18   Temp:  36.5 ??C (97.7 ??F)     TempSrc:  Oral  Oral   SpO2: 96% 97%  98%       ED Course as of 02/11/23 0337   Fri Feb 10, 2023   1606 In summary this is 62 year old female with limited past medical history who presented with abdominal pain, blurry vision.  Per prior provider workup is included concern for possible new # pancreatitis cancer-ophthalmology evaluated and reports that they have a low concern for any pain in the eyes, however has concerned that there may be vascular abnormality or brain abnormality causing this as her current MRI of the brain, as well as imaging of the neck.  Have discussed with them as we cannot get MRI of the abdomen in addition to MRI of the neck at the same time do dynamic imaging and they report carotid Dopplers are appropriate, will order this, MRI of the abdomen for likely pancreatic masses.  Disposition pending workup.   1639 Rebound the patient.  She is sitting up using her phone in no acute distress.  Unfortunately I did have to inform her of the ultrasound findings, concern for metastasis as to why she is going to MRI.  She verbalized understanding.  Is amenable to admission if she needs further workup, and understands we are evaluating for possible ischemic cause to her eyes, any pathology to her brain, in addition to possible new onset cancer in her abdomen.  Will continue to monitor.   1818 Per MRI- pancreatitic primary with liver mets and brain. Repeat in 2 weeks for better eval due to motion.    1919 PVL Carotid Duplex Bilateral   2114 Of note have had a long discussion at this moment in time concerning her diagnosis of pancreatic cancer with mets versus infarction or simple region.  She understand.  She did not have any questions.  She reports she is processing.  Her brother as well as her daughter are also present and understand.  I did relay that this is typically an aggressive cancer.  Metastasis is suboptimal for prognosis.  She verbalized understanding.  I did discuss she would be appropriate for admission given her new troponinemia as well as her new cancer however she has declined this.  She reports she wants to continue to work and take care of her family at this time.  Given declining of admission, creatinine being at baseline will give a dose of Lokelma as well as expedited referral to cancer follow-up clinic however will require neurology evaluation first.       Per Neurology: 2200: Workup, management completed the outpatient setting with aspirin.  They will arrange close interval follow-up  as unclear if metastasis versus stroke in the occipital region.  Discussed with the patient.  Of note I did not know that she is hypokalemic.  She reports that she will follow-up with the nephrology provider in the coming week-will give a dose of Lokelma-consider Lasix however creatinine is improved from prior thus instead Lokelma to reduce potassium, recommended having it checked in the next 3 days.  Of note I did offer the patient admission for expedited workup, however she declined this and prefers to go home as noted above.  Patient was discharged per her request.  Stable condition.    Final care and disposition discussed with Dr. Vonzella Nipple who is in agreement with the plan.    Dorena Cookey, MD  [facility] Emergency Medicine

## 2023-02-11 NOTE — Unmapped (Signed)
Routine follow up:     Please schedule patient for follow as follows:    Resident Clinic  Template: return neurology  Time: follow-up 30 minute slot  Provider: Dr. Atha Starks. If unavailable within time frame can schedule as 1 hour new patient slot with other available resident clinic.   Diagnosis: Occipital lesion  Time range:  between 2-4 months. Please do not schedule sooner. If none of these dates are available, please reply to message sender for further instructions.   Preferred day of week: Any  Please ensure scheduled PRIOR to appointment: N/A    Please reply to message sender with an epic inbox message to confirm the appointment has been scheduled.

## 2023-02-11 NOTE — Unmapped (Signed)
Initial Consult Note        Requesting Attending Physician:  Artist Pais, DO  Service Requesting Consult: Emergency Medicine     Assessment and Plan        Amy [name] is a 62 y.o. right handed female on whom I have been asked by Artist Pais, DO to consult for left visual field loss.    # Right occipital lesion  Left visual field cut with acute onset 02/03/23. MRI Brain with correlating area of right occipital restricted diffusion and other punctate foci of restricted diffusion concerning for embolic infarcts versus metastases in setting of newly diagnosed pancreatic cancer. Appearance of right occipital lesion with increased vascularity on contrasted sequences and circular appearance concerning for metastasis however no significant edema or mass effect and sudden onset with other lesions more consistent with stroke. Following risk/benefit discussion with patient, she elected to discharge home with plan for close outpatient oncology follow-up and additional studies and outpatient consideration for PT/OT for field cut instead of inpatient evaluation. Recommend initiation of aspirin for secondary stroke prevention with plan to re-evaluate in outpatient setting regarding indication for antiplatelet vs anticoagulation depending upon oncology plans. For further evaluation of right occipital lesion could consider vessel imaging with MRA vs CSF studies to evaluate cytology however defer to primary oncology team.     Plan:  - Antithrombotic therapy with: aspirin 81 mg orally every day  - Continue home rosuvastatin for secondary stroke prevention.  - ECHO to evaluate for thrombus  - Recommend outpatient PT/OT  - Informed patient of safety concerns with left hemianopia including but not limited to: no driving until formal OT evaluation and clearance, careful around hazards, care with steps and furniture    This patient was discussed with Dr. Darlina Sicilian, who agrees to the assessment and plan.     Allayne Stack, MD  PGY-3 Neurology       HPI      Chief Complaint: Left hemianopia    HPI: Amy [name] is a 62 y.o. female with a past medical history significant for DMII, HTN, HLD who presents with left sided vision loss.    Stroke risk factors: diabetes mellitus, hypertension, hyperlipidemia, and hypercoagulable state.      Patient notes that on 9/20 the middle of the day she was driving her car when she noticed difficulty seeing out of the left side.  She says the symptoms had improved a bit by the afternoon but then came back over the weekend and have persisted since.  She has noted bumping into walls and furniture with her left arm and leg.  Denies blurred vision but rather loss of visual field.  Symptoms are present with closing either eye.  Denies headache, sensory changes, weakness, vertigo, gait imbalance.    Had also endorsed new onset epigastric pain which was evaluated and was ultimately determined to have a mass lesion in her pancreas concerning for pancreatic cancer.    Past Medical History:  Past Medical History:   Diagnosis Date    Alport syndrome     Back pain     Chronic kidney disease     Dental abscess     Diabetes mellitus (CMS-HCC)     type 2, don't remember A1C, controlled with insulin injections; okay per physician at last visit    Hyperlipidemia     Hypertension     well controlled with meds    Kidney stone     no problems since 1995  Psoriasis      Past Surgical History:  Past Surgical History:   Procedure Laterality Date    BREAST BIOPSY Right 05/30/2018    Intraductal papilloma w/ florid DH & apocrine metaplasia    CATARACT EXTRACTION Right 08/29/2017    COLONOSCOPY      cyst removal in breast      HYSTERECTOMY      NEPHROSTOMY      OOPHORECTOMY      ONLY ONE OVARY REMOVED    PR COLSC FLX W/RMVL OF TUMOR POLYP LESION SNARE TQ N/A 06/19/2018    Procedure: COLONOSCOPY FLEX; W/REMOV TUMOR/LES BY SNARE;  Surgeon: Jarvis Morgan, MD;  Location: [facility];  Service: Gastroenterology    PR XCAPSL CTRC RMVL INSJ IO LENS PROSTH W/O ECP Right 08/29/2017    Procedure: EXTRACAPSULAR CATARACT REMOVAL W/INSERTION OF INTRAOCULAR LENS PROSTHESIS, MANUAL OR MECHANICAL TECHNIQUE Lens Choice:OD ZCB00 +19.50 [id number] +19.00/+18.00;  Surgeon: O'Rese Bonney Roussel, MD;  Location: [facility] OR [facility];  Service: Ophthalmology    PR XCAPSL CTRC RMVL INSJ IO LENS PROSTH W/O ECP Left 09/19/2017    Procedure: EXTRACAPSULAR CATARACT REMOVAL W/INSERTION OF INTRAOCULAR LENS PROSTHESIS, MANUAL OR MECHANICAL TECHNIQUE Lens Choice: ZCB00 +21.00; ZA9003 +20.00/+19.00;  Surgeon: O'Rese Bonney Roussel, MD;  Location: [facility] OR [facility];  Service: Ophthalmology     Social History:  Social History     Socioeconomic History    Marital status: Single     Spouse name: None    Number of children: None    Years of education: None    Highest education level: None   Tobacco Use    Smoking status: Former     Current packs/day: 0.00     Average packs/day: 1 pack/day for 43.0 years (43.0 ttl pk-yrs)     Types: Cigarettes     Start date: 02/12/1978     Quit date: 02/12/2021     Years since quitting: 1.9    Smokeless tobacco: Never   Vaping Use    Vaping status: Every Day   Substance and Sexual Activity    Alcohol use: No    Drug use: No    Sexual activity: Not Currently   Other Topics Concern    Do you use sunscreen? No    Tanning bed use? Yes     Comment: USED IN PAST    Are you easily burned? No    Excessive sun exposure? No    Blistering sunburns? Yes     Social Determinants of Health     Financial Resource Strain: Unknown (08/29/2017)    Overall Financial Resource Strain (CARDIA)     Difficulty of Paying Living Expenses: Patient declined   Food Insecurity: Unknown (08/29/2017)    Hunger Vital Sign     Worried About Running Out of Food in the Last Year: Patient declined     Ran Out of Food in the Last Year: Patient declined   Transportation Needs: Unknown (08/29/2017)    PRAPARE - Therapist, art (Medical): Patient declined     Lack of Transportation (Non-Medical): Patient declined   Physical Activity: Unknown (08/29/2017)    Exercise Vital Sign     Days of Exercise per Week: Patient declined     Minutes of Exercise per Session: Patient declined   Social Connections: Unknown (08/29/2017)    Social Connection and Isolation Panel [NHANES]     Frequency of Communication with Friends and Family: Patient declined  Frequency of Social Gatherings with Friends and Family: Patient declined     Attends Religious Services: Patient declined     Database administrator or Organizations: Patient declined     Attends Engineer, structural: Patient declined     Marital Status: Patient declined     Family History:  Family History   Problem Relation Age of Onset    Kidney disease Mother     Diabetes Mother     Cataracts Mother     Heart attack Father     Cancer Paternal Grandmother     Breast cancer Neg Hx     Colon cancer Neg Hx     Endometrial cancer Neg Hx     Ovarian cancer Neg Hx     Melanoma Neg Hx     Basal cell carcinoma Neg Hx     Squamous cell carcinoma Neg Hx      Medications:    Current Facility-Administered Medications:     nicotine (NICODERM CQ) 21 mg/24 hr patch 1 patch, 1 patch, Transdermal, Daily, Cleone Slim, MD    nicotine (NICODERM CQ) 7 mg/24 hr patch 1 patch, 1 patch, Transdermal, Daily PRN, Tawni Levy, MD, 1 patch at 02/10/23 1222    nicotine polacrilex (NICORETTE) gum 4 mg, 4 mg, Buccal, Q1H PRN **OR** nicotine polacrilex (NICORETTE) lozenge 4 mg, 4 mg, Buccal, Q1H PRN, Cleone Slim, MD    Current Outpatient Medications:     amoxicillin (AMOXIL) 500 MG capsule, , Disp: , Rfl:     betamethasone dipropionate (DIPROLENE) 0.05 % ointment, , Disp: , Rfl: 4    blood sugar diagnostic (TRUE METRIX GLUCOSE TEST STRIP) Strp, True Metrix Glucose Test Strip, Disp: , Rfl:     calcipotriene (DOVONEX) 0.005 % cream, Apply topically Two (2) times a day. (Patient not taking: Reported on 02/01/2023), Disp: 60 g, Rfl: 2    cholecalciferol, vitamin D3-25 mcg, 1,000 unit,, 25 mcg (1,000 unit) capsule, Take 1 capsule (25 mcg total) by mouth daily., Disp: , Rfl:     clobetasol (TEMOVATE) 0.05 % external solution, Apply topically two (2) times a day. To scalp, Disp: 50 mL, Rfl: 3    clobetasol (TEMOVATE) 0.05 % ointment, Apply to your thick psoriasis areas twice daily until clear, Disp: 60 g, Rfl: 5    DEXCOM G6 SENSOR Devi, , Disp: , Rfl:     empagliflozin (JARDIANCE) 10 mg tablet, Take 1 tablet (10 mg total) by mouth daily., Disp: 90 tablet, Rfl: 0    ferric citrate (AURYXIA) 210 mg iron Tab tablet, Take 1 tablet (210 mg total) by mouth Three (3) times a day with a meal. (Patient not taking: Reported on 02/01/2023), Disp: 90 tablet, Rfl: 11    furosemide (LASIX) 20 MG tablet, Take 1 tablet (20 mg total) by mouth daily as needed. (Patient not taking: Reported on 02/09/2022), Disp: 15 tablet, Rfl: 11    lidocaine 4 % patch, Place 1 patch on the skin daily. (Patient not taking: Reported on 02/01/2023), Disp: 10 patch, Rfl: 0    lisinopril (PRINIVIL,ZESTRIL) 10 MG tablet, Take 1 tablet (10 mg total) by mouth daily., Disp: , Rfl:     methylPREDNISolone (MEDROL DOSEPACK) 4 mg tablet, follow package directions, Disp: 1 each, Rfl: 0    NOVOLOG FLEXPEN U-100 INSULIN 100 unit/mL (3 mL) injection pen, , Disp: , Rfl:     OZEMPIC 0.25 mg or 0.5 mg (2 mg/3 mL) PnIj, , Disp: , Rfl:  pen needle, diabetic 32 gauge x 5/32 (4 mm) Ndle, Inject as directed Three (3) times a day before meals., Disp: , Rfl:     pregabalin (LYRICA) 25 MG capsule, , Disp: , Rfl:     rosuvastatin (CRESTOR) 10 MG tablet, Take by mouth., Disp: , Rfl:     SEMGLEE,INSULIN GLARG-YFGN,PEN 100 unit/mL (3 mL) InPn, , Disp: , Rfl:     SYMBICORT 160-4.5 mcg/actuation inhaler, , Disp: , Rfl:     terbinafine HCL (LAMISIL) 250 mg tablet, Take by mouth., Disp: , Rfl:     TRUEPLUS PEN NEEDLE 32 gauge x 5/32 Ndle, , Disp: , Rfl: 10    ustekinumab (STELARA) 45 mg/0.5 mL Syrg syringe, Inject the contents of 1 syringe (45 mg) under the skin once every 12 weeks., Disp: 0.5 mL, Rfl: 4     Allergies:  Allergies   Allergen Reactions    Sulfur-8 Nausea Only    Glucophage [Metformin]     Codeine Itching    Morphine Itching      Review of Systems   A 12 system review of systems was negative except as noted in HPI.   Physical Exam     General Appearance:In no acute distress.  HEENT: Head is atraumatic and normocephalic. Sclera anicteric without injection. Oropharyngeal membranes are moist with no erythema or exudate.  Cardiopulmonary: Regular rate and rhythm.  Normal work of breathing on room air.    Neurological Examination:   Mental Status: Alert, fully oriented, answers questions appropriately. Attention and concentration grossly intact to interview and physical. Spontaneous speech was fluent without word finding pauses, dysarthria, or paraphasic errors. Comprehension was intact to simple and multi-step commands. Memory for recent and remote events was intact. Able to name and repeat    Cranial Nerves: Visual field testing demonstrates left homonymous hemianopia to near midline. PERRL. Ocular movements intact in all directions. Facial sensation intact bilaterally to light touch in all three divisions of CNV. Face symmetric at rest. Normal facial movement bilaterally, including forehead, eye closure and grimace/smile. Hearing intact to conversation. Palate movement is symmetric. Tongue protrudes midline and tongue movements are normal.    Motor Exam: Normal bulk.  No tremors, myoclonus, or other adventitious movement.  Pronator drift is absent.  UE R/L: biceps 5/5, triceps 5/5, and hand grip strong/strong   LE R/L: hip flexion 5/5, dorsiflexion 5/5, and plantar flexion 5/5.    Reflexes: Deferred.    Sensory: Sensation normal to light touch in both hands and both feet    Cerebellar/Coordination/Gait: Finger-to-nose is normal without ataxia or dysmetria bilaterally. Heel-to-shin is normal without ataxia or dysmetria bilaterally. Gait exam demonstrates normal posture, base, stride length, arm swing and turns.        NIHSS on initial evaluation:  (1a.) Level of Consciousness:  0 = Alert; keenly responsive   (1b.) LOC Questions:  0 = Answers both questions correctly   (1c.) LOC Commands:  0 = Performs both tasks correctly   (2.)   Best Gaze:  0 = Normal   (3.)   Visual:  2 = Complete hemianopia   (4.)   Facial Palsy:  0 = Normal symmetric movement   (5a.) Motor Arm, Left:  0 = No drift, limb holds 90 (or 45) degrees for full 10 seconds   (5b.) Motor Arm, Right:  0 = No drift, limb holds 90 (or 45) degrees for full 10 seconds   (6a.) Motor Leg, Left:  0 = No drift, limb holds 90 (or  45) degrees for full 5 seconds   (6b.) Motor Leg, Right:  0 = No drift, limb holds 90 (or 45) degrees for full 5 seconds   (7.)   Limb Ataxia:  0 = Absent   (8.)   Sensory:  0 = Normal; no sensory loss   (9.)   Best Language:  0 = No aphasia, normal   (10.) Dysarthria:  0 = Normal   (11.) Extinction and Inattention:  0 = No abnormality   NIHSS Total Score:  2      Data Review     Temp:  [36.4 ??C (97.5 ??F)-36.9 ??C (98.4 ??F)] 36.9 ??C (98.4 ??F)  Heart Rate:  [62-70] 66  SpO2 Pulse:  [62-70] 66  Resp:  [11-16] 14  BP: (104-131)/(57-69) 131/65  MAP (mmHg):  [71-80] 80  SpO2:  [95 %-96 %] 96 %  No intake/output data recorded.    Imaging and relevant diagnostic studies reviewed, pertinent findings as in above assessment and plan.

## 2023-02-13 NOTE — Unmapped (Signed)
Hemoglobin A1c  Order: [id number]   Status: Final result      Hemoglobin A1C =7.3 High         Results reviewed and sent to Dr. Dorena Cookey to review as ordering provider.

## 2023-02-14 NOTE — Unmapped (Signed)
Multidisciplinary Oncology Program Intake Form    Referral Receive Date:  02/14/23    Rapid Access Appointment was offered and declined  N/A    Reason for Referral: suspect pancreas cancer with liver mets  Internal Referral  Disease Group: UCC  Specialty:  Medical Oncology    Referral Method:  Epic    Insurance  Primary Insurance: Private/ACA/Employer     Authorization Obtained: NA      Record Collection:     RECORDS -  INTERNAL REFERRAL    Screening Questions:     If <43, is the patient interested in learning about [facility] Preservation? NA    Close the Loop Communication:  Referring Provider Contacted: Yes  Patient Contacted: Yes    Welcome Packet   Date Sent: 02/14/23  Sent: Email

## 2023-02-14 NOTE — Unmapped (Unsigned)
[facility] UNDIAGNOSED CANCER CLINIC NOTE    Encounter Date: 02/15/2023    Referring Physician:   Artist Pais, Do  [address]  [place],  [place] [address]    PCP:   Amy Fetch Achirimofor, MD    Consulting Providers:    ____________________________________________________________________  Oncology History:   Diagnosis: 2.9 cm pancreatic mass w/ numerous liver mets   Current Goal of Therapy: pending pathology   Molecular: Tempus pending (plan to request at time of biopsy)  ____________________________________________________________________  Assessment and Plan:  Amy [name] is a 62 y.o. female who presented for evaluation and recommendations regarding:    2.9 cm pancreatic mass w/ numerous liver mets w/ largest being 3.2 cm w/ intracranial metastatic disease, most prominently in the right occipital lobe:     - IR liver biopsy  - Tempus post biopsy  - AFP, CEA, CA 19-9 pending  - Port pending pathology   - palliative care pending path  - consider nutritional referral   - Dex 2 mg daily ***   - Recommend repeat brain MRI d/t motion at time of original scans?     Supportive Care:  Hyperkalemia: K while in ED 5.9. Declined admission. Lokelma given. Repeat not collected given patient desire to be discharged.   - Collect CMP now   - Nephrology 03/01/2023    Occipital lesion:   - Neurology follow up post hospital 04/05/2023    Follow-Up:  Pending pathology.     This note was created with dictation software. Please excuse any transcription errors.   _____________________________________________________________________  History:  Amy [name] is a 62 y.o. female who presented as a new patient in consultation at the request of Amy Pais, DO regarding ***.    [name] presented on 9/26 to ED for left eye nontraumatic painless, unilateral vision loss  with new onset epigastric pain. After further evaluation by ophthalmology, she had no signs of retinal detachment or tear. Additionally, during encounter labs revealed elevated troponin w/ EKG evidence of ST depression.       ***    Past Medical History:   Alport syndrome      Back pain      Chronic kidney disease      Dental abscess      Diabetes mellitus (CMS-HCC)       type 2, don't remember A1C, controlled with insulin injections; okay per physician at last visit    Hyperlipidemia      Hypertension       well controlled with meds    Kidney stone       no problems since 1995    Psoriasis             Family History:  Mother: ***  Father: ***  Children: ***  Problem Relationship    Kidney disease Mother    Diabetes Mother    Cataracts Mother    Heart attack Father    Cancer Paternal Grandmother    Breast cancer Neg Hx    Colon cancer Neg Hx    Endometrial cancer Neg Hx    Ovarian cancer Neg Hx    Melanoma Neg Hx    Basal cell carcinoma Neg Hx    Squamous cell carcinoma Neg Hx       Social History:  Tobacco Use    Smoking status: Former       Current packs/day: 0.00       Average packs/day: 1 pack/day for 43.0 years (43.0 ttl  pk-yrs)       Types: Cigarettes       Start date: 02/12/1978       Quit date: 02/12/2021       Years since quitting: 1.9    Smokeless tobacco: Never   Substance Use Topics    Alcohol use: No        Review of Systems:  A ten-point review of systems was conducted and negative unless stated in HPI.    Physical Exam:  VITALS: There were no vitals taken for this visit.  CONSTITUTIONAL: in NAD, appears stated age, able to get on exam table unassisted  HEENT: MMM, no oral lesions or exudates, neck supple  CARDIO: normal rate, regular rhythm, no murmurs, port ***  RESPIRATORY: CTAB b/l, normal WOB on RA, no conversational dyspnea  GI: soft, NT, ND, no appreciable HSM  EXTREMITIES: no LE edema  SKIN: no rashes or ecchymoses  NEUROLOGIC: alert and oriented, no gross focal deficits noted   LYMPH: no supraclavicular, axillary, or cervical LAD  PSYCH: appropriate mood and affect, good insight and judgment     Labs:  Reviewed In Epic  ***    Imaging:  I personally reviewed the following imaging studies and my interpretation is:  ***

## 2023-02-15 ENCOUNTER — Ambulatory Visit: Admit: 2023-02-15 | Discharge: 2023-02-16 | Payer: PRIVATE HEALTH INSURANCE

## 2023-02-15 DIAGNOSIS — Z720 Tobacco use: Principal | ICD-10-CM

## 2023-02-15 DIAGNOSIS — Z716 Tobacco abuse counseling: Principal | ICD-10-CM

## 2023-02-15 DIAGNOSIS — C259 Malignant neoplasm of pancreas, unspecified: Principal | ICD-10-CM

## 2023-02-15 LAB — CBC W/ AUTO DIFF
BASOPHILS ABSOLUTE COUNT: 0.1 10*9/L (ref 0.0–0.1)
BASOPHILS RELATIVE PERCENT: 0.8 %
EOSINOPHILS ABSOLUTE COUNT: 0.1 10*9/L (ref 0.0–0.5)
EOSINOPHILS RELATIVE PERCENT: 0.7 %
HEMATOCRIT: 37.3 % (ref 34.0–44.0)
HEMOGLOBIN: 12.2 g/dL (ref 11.3–14.9)
LYMPHOCYTES ABSOLUTE COUNT: 1.7 10*9/L (ref 1.1–3.6)
LYMPHOCYTES RELATIVE PERCENT: 21.8 %
MEAN CORPUSCULAR HEMOGLOBIN CONC: 32.7 g/dL (ref 32.0–36.0)
MEAN CORPUSCULAR HEMOGLOBIN: 29.1 pg (ref 25.9–32.4)
MEAN CORPUSCULAR VOLUME: 89.1 fL (ref 77.6–95.7)
MEAN PLATELET VOLUME: 8.2 fL (ref 6.8–10.7)
MONOCYTES ABSOLUTE COUNT: 0.8 10*9/L (ref 0.3–0.8)
MONOCYTES RELATIVE PERCENT: 10.6 %
NEUTROPHILS ABSOLUTE COUNT: 5.3 10*9/L (ref 1.8–7.8)
NEUTROPHILS RELATIVE PERCENT: 66.1 %
NUCLEATED RED BLOOD CELLS: 0 /100{WBCs} (ref <=4)
PLATELET COUNT: 92 10*9/L — ABNORMAL LOW (ref 150–450)
RED BLOOD CELL COUNT: 4.19 10*12/L (ref 3.95–5.13)
RED CELL DISTRIBUTION WIDTH: 14.2 % (ref 12.2–15.2)
WBC ADJUSTED: 8 10*9/L (ref 3.6–11.2)

## 2023-02-15 LAB — COMPREHENSIVE METABOLIC PANEL
ALBUMIN: 3.3 g/dL — ABNORMAL LOW (ref 3.4–5.0)
ALKALINE PHOSPHATASE: 300 U/L — ABNORMAL HIGH (ref 46–116)
ALT (SGPT): 197 U/L — ABNORMAL HIGH (ref 10–49)
ANION GAP: 7 mmol/L (ref 5–14)
AST (SGOT): 97 U/L — ABNORMAL HIGH (ref <=34)
BILIRUBIN TOTAL: 0.4 mg/dL (ref 0.3–1.2)
BLOOD UREA NITROGEN: 64 mg/dL — ABNORMAL HIGH (ref 9–23)
BUN / CREAT RATIO: 14
CALCIUM: 9.5 mg/dL (ref 8.7–10.4)
CHLORIDE: 107 mmol/L (ref 98–107)
CO2: 24.8 mmol/L (ref 20.0–31.0)
CREATININE: 4.58 mg/dL — ABNORMAL HIGH
EGFR CKD-EPI (2021) FEMALE: 10 mL/min/{1.73_m2} — ABNORMAL LOW (ref >=60)
GLUCOSE RANDOM: 237 mg/dL — ABNORMAL HIGH (ref 70–179)
POTASSIUM: 5.2 mmol/L — ABNORMAL HIGH (ref 3.4–4.8)
PROTEIN TOTAL: 7.1 g/dL (ref 5.7–8.2)
SODIUM: 139 mmol/L (ref 135–145)

## 2023-02-15 LAB — LIPID PANEL
CHOLESTEROL/HDL RATIO SCREEN: 3.7 (ref 1.0–4.5)
CHOLESTEROL: 166 mg/dL (ref <=200)
HDL CHOLESTEROL: 45 mg/dL (ref 40–60)
LDL CHOLESTEROL CALCULATED: 69 mg/dL (ref 40–99)
NON-HDL CHOLESTEROL: 121 mg/dL (ref 70–130)
TRIGLYCERIDES: 258 mg/dL — ABNORMAL HIGH (ref 0–150)
VLDL CHOLESTEROL CAL: 51.6 mg/dL — ABNORMAL HIGH (ref 11–41)

## 2023-02-15 LAB — HEMOGLOBIN A1C
ESTIMATED AVERAGE GLUCOSE: 169 mg/dL
HEMOGLOBIN A1C: 7.5 % — ABNORMAL HIGH (ref 4.8–5.6)

## 2023-02-15 LAB — CANCER ANTIGEN 19-9: CA 19-9: 41.78 U/mL — ABNORMAL HIGH (ref 0–35)

## 2023-02-15 LAB — AFP TUMOR MARKER: AFP-TUMOR MARKER: <2 ng/mL (ref <=8)

## 2023-02-15 LAB — CEA: CARCINOEMBRYONIC ANTIGEN: 18.2 ng/mL — ABNORMAL HIGH (ref 0.0–5.0)

## 2023-02-15 MED ORDER — NICOTINE 14 MG/24 HR DAILY TRANSDERMAL PATCH
MEDICATED_PATCH | TRANSDERMAL | 0 refills | 14 days | Status: CP
Start: 2023-02-15 — End: 2023-03-01

## 2023-02-15 MED ORDER — OXYCODONE 5 MG TABLET
ORAL_TABLET | ORAL | 0 refills | 2 days | Status: CP | PRN
Start: 2023-02-15 — End: ?

## 2023-02-15 MED ORDER — NICOTINE 7 MG/24 HR DAILY TRANSDERMAL PATCH
MEDICATED_PATCH | TRANSDERMAL | 0 refills | 28.00000 days | Status: CP
Start: 2023-02-15 — End: 2023-03-15

## 2023-02-15 MED ORDER — NICOTINE 21 MG/24 HR DAILY TRANSDERMAL PATCH
MEDICATED_PATCH | TRANSDERMAL | 0 refills | 56 days | Status: CP
Start: 2023-02-15 — End: 2023-04-12

## 2023-02-15 NOTE — Unmapped (Signed)
Adam, oncology nurse navigator asking is patient's follow up appointment can be moved up. They are repeating an MRI on 10/16 due to concern for intercranial metastasis.

## 2023-02-15 NOTE — Unmapped (Signed)
Quitting Tobacco:   Congratulations on taking action to become tobacco free! I am here to support you, and encourage you to use other resources as well, such as the free Quitline[place] (800 570-805-8825); online programs (PumpkinSearch.com.ee ,  www.becomeanex.org , or MechanicalArm.dk). Counseling plus medication increases your chances of becoming and staying tobacco free. Please call if you have any concerns about medications I recommended or prescribed.  Sometimes we need to try different medications or combinations if these do not work.

## 2023-02-15 NOTE — Unmapped (Signed)
Per Ophelia Charter, referral to [facility] Tobacco Treatment Program entered.

## 2023-02-15 NOTE — Unmapped (Signed)
Addended by: Quinn Plowman on: 02/15/2023 02:38 PM     Modules accepted: Orders

## 2023-02-15 NOTE — Unmapped (Signed)
Addended by: Quinn Plowman on: 02/15/2023 02:13 PM     Modules accepted: Orders

## 2023-02-16 ENCOUNTER — Ambulatory Visit: Admit: 2023-02-16 | Discharge: 2023-02-17 | Payer: PRIVATE HEALTH INSURANCE

## 2023-02-16 DIAGNOSIS — R928 Other abnormal and inconclusive findings on diagnostic imaging of breast: Principal | ICD-10-CM

## 2023-02-16 NOTE — Unmapped (Signed)
Counseled Amy [name], 62 y.o. for treatment of tobacco use/dependence. Pt was referred by Ophelia Charter, FNP in Oncology Hematology [facility] clinic.     SUMMARY: Counselor assessed pt's current tobacco use and history of use, and used MI to elicit motivation. Counselor provided evidence-based treatment for tobacco use, including discussing triggers, behavioral strategies, and tobacco cessation medications. Pt gave verbal consent for MSW student Marlowe Kays to participate in the session. Pt endorsed current daily tobacco use of just under 1ppd and expressed interest in quitting.  Pt cited kidney transplant, cancer tx, and supporting her daughter in quitting smoking as motivations for smoking cessation, and pt stated I know it'll be a tough battle to beat.  Pt identified feelings of stress from work and caring for grandchildren, and ritual cues with waking as common triggers for smoking behaviors. Pt lives with daughter, who also smokes, and two granddaughters ages 6 and 3. Pt endorses smoking in the home, and mentions sometimes leaving the home to smoke as well. Pt shared past history of quitting other substances, but states more challenges with reducing tobacco use.  Pt mentioned past quit periods from tobacco without any medications. Pt already has prescription for 21mg  nicotine patch and mentioned going to pick them up soon. Counselor and pt reviewed health education regarding proper application, side-effects, and efficacies of nicotine patches. Counselor offers continued assistance with building behavioral skills for tobacco cessation and recognizing triggers. Pt expressed interest in receiving ongoing support and stated plan to take it one day at a time. Counselor made plans to follow up with pt. Counselor and pt discussed behavioral strategies pt could adopt to help mitigate her urges and reduce her tobacco use (listed below).     Tobacco Use Treatment  Program: [facility]  Type of Visit: Initial  Session Number: 1  Tobacco Use Treatment Visit: Talked with patient  Goals Of Session: Assessment, Behavior management, improve    Cancer Center Patients  Primary Service: Hem Onc [facility])  Primary Cancer Type: Gastrointestinal (GI)    Tobacco Use During Past 30 Days  Time Since Last Tobacco Use: smoked a cigarette today (at least one puff)  Tobacco Withdrawal (Past 24 Hours): Desire or craving to smoke  Type of Tobacco Products Used: Cigarettes  Quantity Used: 15  Quantity Per: day  Time to First Use After Waking: Immediately  Wake During Sleep to Smoke: Some nights  Other Household Members Use Tobacco: Yes (daughter smokes)  Smoking Allowed in Home: Yes  Smoke During Work Day: No    Tobacco Use History  Age Began Use (Years Old): 16  Longest Time Without Tobacco: Days  Most Recent Attempt: Currently working on  Medications Used in Past Attempts: None    Behavioral Assessment  Why Uses: 1. habit 2. stress management  Reasons to Become Tobacco Free: 1. health 2. family (daughter) 3. recovery 4. qualify for kidney transplant  Barriers/Challenges: 1. long standing habit 2. stress 3. daughter smokes 4. carring for grandchildren 5. work-related stress 6. ritual cues with waking and sleeping  Strategies: 1. 21mg  nicotine patch 2. working on quitting with daughter 3. psychoeducation on distress tolerance    Treatment Plan  Cessation Meds Currently Using: None  Outpatient/Discharge Medications Recommended: Patch 21mg   Plan to Obtain Outpatient Meds: Other (Pt to pick up from pharmacy)  Patient's Plan Post Discharge/Visit: Plan to quit in next 6 months  Follow-up Plan: Phone follow-up scheduled, Permission given  Family Members Included in Intervention/Plan: Yes (daughter is a support)  TTS Information  Diagnosis: Tobacco use disorder, unspecified, uncomplicated (F17.200)  Interventions: Assessed, Tx plan development, Informed, Psycho-education, Encouraged, Supportive therapy  TTS Visit Length: Psychotherapy >16 min    Seward Carol  MSW Candidate  [facility] Tobacco Treatment Program  [phone]

## 2023-02-16 NOTE — Unmapped (Signed)
02/16/23 TC-spoke with pt, attempted to schedule VIR Live Bx, she was in a restaurant and asked me to call back at a later time.(SG)

## 2023-02-16 NOTE — Unmapped (Signed)
02/16/23 APPROVED [place] [facility]gent Liver Bx by Chestine Spore ( US guided focal liver biopsy at [place])/ HOLD ASA 81 mg 5 DAYS PRIOR / 02/15/23(SG) // on 02/16/23 scheduled Bx 1st AVAILABLE and early at pt's request due to DM. Pt verbalized understanding to HOLD ASA 5 DAYS PRIOR, discussed pre procedure instructions, confirmed daughter as driver, confirmed [employer] Ins.(SG)

## 2023-02-16 NOTE — Unmapped (Signed)
[facility] VIR Biopsy Request Information Sheet     Referring Provider:  Rod Mae, FNP   Date of Review:  02/16/2023    Reviewing Provider:  Kevan Rosebush, MD     Requested Biopsy Site:  liver lesion    Reason for Request:  62 year old female with recent imaging findings of a pancreatic head mass and multiple liver lesions concerning for pancreatic primary with hepatic metastases.     - on aspirin    Past Medical History:    Past Medical History:   Diagnosis Date    Alport syndrome     Back pain     Chronic kidney disease     Dental abscess     Diabetes mellitus (CMS-HCC)     type 2, don't remember A1C, controlled with insulin injections; okay per physician at last visit    Hyperlipidemia     Hypertension     well controlled with meds    Kidney stone     no problems since 1995    Psoriasis        Imaging reviewed:  I reviewed all pertinent diagnostic studies, including:  MRI and [place]  MRI - Numerous hypo enhancing lesions throughout the liver.        Recommended Imaging Modality to Perform Biopsy:  Ultrasound    Comments for Biopsy:    -US guided biopsy of one of the hypoechoic liver lesions  - 5 day aspirin hold    Comments from ordering provider:  NA    Amy [name] R. Chestine Spore, MD  Assistant Professor, Interventional Radiology

## 2023-02-16 NOTE — Unmapped (Signed)
Spoke with pt this morning to let her know per PreArrivalOncAuth pt will need to have scans done [facility].  Scans are now scheduled.

## 2023-02-16 NOTE — Unmapped (Signed)
Addended by: Quinn Plowman on: 02/16/2023 09:15 AM     Modules accepted: Orders

## 2023-02-21 NOTE — Unmapped (Signed)
VIR pre procedure prep call completed. Reviewed to register at 0700 through main entrance at [facility] then proceed to VIR on 2nd floor.  Informed of no show/late cancellation policy. NPO guidelines reviewed. Pt OK to take sips of clear liquids with all AM meds. 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 Pt verbalized understanding. All questions answered.      Last dose of ASA several days ago.    Diabetic medication guidelines reviewed.

## 2023-02-22 ENCOUNTER — Ambulatory Visit: Admit: 2023-02-22 | Discharge: 2023-02-23 | Payer: PRIVATE HEALTH INSURANCE

## 2023-02-22 DIAGNOSIS — Z716 Tobacco abuse counseling: Principal | ICD-10-CM

## 2023-02-22 DIAGNOSIS — C259 Malignant neoplasm of pancreas, unspecified: Principal | ICD-10-CM

## 2023-02-22 MED FILL — STELARA 45 MG/0.5 ML SUBCUTANEOUS SYRINGE: 84 days supply | Qty: 0.5 | Fill #3

## 2023-02-27 ENCOUNTER — Ambulatory Visit: Admit: 2023-02-27 | Discharge: 2023-02-27 | Payer: PRIVATE HEALTH INSURANCE

## 2023-02-27 DIAGNOSIS — C259 Malignant neoplasm of pancreas, unspecified: Principal | ICD-10-CM

## 2023-02-27 MED ADMIN — midazolam (VERSED) injection 2 mg: 2 mg | INTRAVENOUS | @ 12:00:00 | Stop: 2023-02-27

## 2023-02-27 MED ADMIN — lidocaine (XYLOCAINE) 10 mg/mL (1 %) injection: SUBCUTANEOUS | @ 12:00:00 | Stop: 2023-02-27

## 2023-02-27 MED ADMIN — acetaminophen (TYLENOL) tablet 1,000 mg: 1000 mg | ORAL | @ 15:00:00 | Stop: 2023-02-27

## 2023-02-27 MED ADMIN — fentaNYL (PF) (SUBLIMAZE) injection: INTRAVENOUS | @ 12:00:00 | Stop: 2023-02-27

## 2023-02-27 NOTE — Unmapped (Signed)
[facility] INTERVENTIONAL RADIOLOGY - Pre Procedure H/P      Assessment/Plan:    Amy [name] is a 62 y.o. female who will undergo targeted liver mass biopsy with moderate sedation in Interventional Radiology.    Informed Consent:  This procedure and sedation has been fully reviewed with the patient/patient???s authorized representative. The risks, benefits and alternatives including but not limited to infection, bleeding, damage to adjacent structures have been explained, and the patient/patient???s authorized representative has consented to the procedure. Consent obtained by Anice Paganini, PA, witnessed, and scanned into the medical record.  --The patient will accept blood products in an emergent situation.  --The patient does not have a Do Not Resuscitate order in effect.      HPI: Amy [name] is a 62 y.o. female with PMH of a pancreatic head mass and multiple liver lesions concerning for pancreatic primary with hepatic metastases who presents for targeted liver mass biopsy.    Past Medical History:   Diagnosis Date    Alport syndrome     Back pain     Chronic kidney disease     Dental abscess     Diabetes mellitus (CMS-HCC)     type 2, don't remember A1C, controlled with insulin injections; okay per physician at last visit    Hyperlipidemia     Hypertension     well controlled with meds    Kidney stone     no problems since 1995    Psoriasis        Past Surgical History:   Procedure Laterality Date    BREAST BIOPSY Right 05/30/2018    Intraductal papilloma w/ florid DH & apocrine metaplasia    CATARACT EXTRACTION Right 08/29/2017    COLONOSCOPY      cyst removal in breast      HYSTERECTOMY      NEPHROSTOMY      OOPHORECTOMY      ONLY ONE OVARY REMOVED    PR COLSC FLX W/RMVL OF TUMOR POLYP LESION SNARE TQ N/A 06/19/2018    Procedure: COLONOSCOPY FLEX; W/REMOV TUMOR/LES BY SNARE;  Surgeon: Jarvis Morgan, MD;  Location: [facility];  Service: Gastroenterology    PR XCAPSL CTRC RMVL INSJ IO LENS PROSTH W/O ECP Right 08/29/2017    Procedure: EXTRACAPSULAR CATARACT REMOVAL W/INSERTION OF INTRAOCULAR LENS PROSTHESIS, MANUAL OR MECHANICAL TECHNIQUE Lens Choice:OD ZCB00 +19.50 [id number] +19.00/+18.00;  Surgeon: O'Rese Bonney Roussel, MD;  Location: [facility] OR [facility];  Service: Ophthalmology    PR XCAPSL CTRC RMVL INSJ IO LENS PROSTH W/O ECP Left 09/19/2017    Procedure: EXTRACAPSULAR CATARACT REMOVAL W/INSERTION OF INTRAOCULAR LENS PROSTHESIS, MANUAL OR MECHANICAL TECHNIQUE Lens Choice: ZCB00 +21.00; ZA9003 +20.00/+19.00;  Surgeon: O'Rese Bonney Roussel, MD;  Location: [facility] OR [facility];  Service: Ophthalmology       Allergies:   Allergies   Allergen Reactions    Sulfur-8 Nausea Only    Glucophage [Metformin]     Codeine Itching    Morphine Itching       Medications:  ASA 81mg  daily - last dose 5 days ago    ASA Grade: ASA 2 - Patient with mild systemic disease with no functional limitations    PE:    Vitals:    02/27/23 0810   BP: 108/54   Pulse: 70   Resp: 12   Temp:    SpO2: 98%     General: female in NAD.  Airway assessment: Class 2 - Can visualize soft palate  and fauces, tip of uvula is obscured  Lungs: Respirations nonlabored

## 2023-02-27 NOTE — Unmapped (Signed)
VIR POST BIOPSY NOTE    Date/Time:Marland Kitchen.February 27, 2023 8:32 AM      Attending: Carmie End, MD    Assistant(s): None    Diagnosis: Pancreatic mass with liver lesions    Procedure:  Biopsy of liver mass    Time out: Prior to the procedure, a time out was performed with all team members present.  During the time out, the patient, procedure and procedure site when applicable were verbally verified by the team members and Carmie End, MD.    Anesthesia:  Conscious Sedation    Complications: NONE    Estimated Blood Loss: Minimal    Specimens: Sent to surgical pathology.    Major Findings:  Successful Ultrasound -guided biopsy of right hepatic lobe mass.   A total of 3 samples were obtained.  Hemostasis of the tract was achieved using Gelfoam Pledget Embolization.     Plan:  Bed rest for 2 hours.    Attending Signature:    See detailed procedure note with images in PACS.    The patient tolerated the procedure well without incident or complication and was returned to PRU in stable condition.

## 2023-02-28 DIAGNOSIS — C259 Malignant neoplasm of pancreas, unspecified: Principal | ICD-10-CM

## 2023-02-28 NOTE — Unmapped (Signed)
Called pt to go over biopsy pathology of metastatic pancreatic adenocarcinoma.  IB message sent to GI intake to have pt set up to see Dr Rozell Searing.  IB also sent to Donnelly Stager to send tempus on biopsy [id number] from 02/27/23.

## 2023-02-28 NOTE — Unmapped (Signed)
Post procedure phone call complete. Pt states they are doing well. Advised to call back with any questions or concerns.

## 2023-03-01 DIAGNOSIS — C259 Malignant neoplasm of pancreas, unspecified: Principal | ICD-10-CM

## 2023-03-02 NOTE — Unmapped (Signed)
Chart has been reviewed and patient is found to be eligible for the [facility]. An oncology patient navigator (OPN) will provide proactive outreach.

## 2023-03-02 NOTE — Unmapped (Signed)
Returned call to pt. States she was scheduled for a MRI today but was cancelled due the machine being done.She was rescheduled for 03-16-23.She has an appt to see Dr. Rozell Searing on 03-15-23.    She was calling to see if she could get the scan done in [place] instead of [facility]? States that is closer to her home.Let her know I will relay the message and let her know.She is agreeable to this plan and will call back with any further questions/concerns.

## 2023-03-02 NOTE — Unmapped (Signed)
Hi,     Amy [name] contacted the Communication Center requesting to speak with the care team of Amy [name] to discuss:    Amy [name] is calling requesting a call back from [facility].     Please contact Amy [name] back  at [phone].    Thank you,   Noland Fordyce  [facility] Communication Center   [phone]

## 2023-03-02 NOTE — Unmapped (Signed)
Called and spoke with Amy [name]. Let her know per Dr. Rozell Searing:  Let's just leave it as is and not get locally. If she is ok with that?     Amy [name] will relay the message to her mother.Asked her to call back with any issues with this.She is agreeable to this plan.

## 2023-03-03 NOTE — Unmapped (Signed)
[facility] Central Navigation Program: Pre-Consult Assessment (PCA)    Patient Story:Ms. Spegal is a very pleasant 62 year old female who lives in [place] who is preparing for her consultation at [facility]. Patient lives with her daughter and two granddaughters (Ages 6 and 2) . 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 Patient is still currently working.Patient's appetite is fair and feels when she eats she gets full easily. Patient said when she had added vinegar to cabbage and tried to eat it, it had caused some stomach pain.She was able to enjoy vinegar prior to diagnosis. Patient enjoys spending time with her grandchildren. Patient has a strong social support system which includes her daughter, grandchildren, a brother and an Aunt that lives close by. Patient asked if her MRI could be done on same day as her neurology appointment since the drive to [facility] is an hour from her home. OPN to check on that and will contact her when she has some information. Patient was appreciative of call and open to future navigation.     Completed with: Patient  Recruitment consultant Access and Use : is an active Museum/gallery conservator - How often the internet is accessed : Every Day   Preferred Communication: MyChart  Social Support System: Yes  The following are who provide support :Child/Children and Producer, television/film/video for consult visit: personal Art gallery manager Resources Strain: not present  Current nutrition or appetite concerns: No change in appetite and No change in weight    Education/Referrals/Interventions: Coping/Emotional Support, External Support Resources, Nutritional therapist, Visit Prep    Oncology Patient Navigator (OPN) completed a proactive outreach call. OPN provided a warm welcome to [facility] and reviewed the role of OPN. OPN conducted initial evaluation of potential barriers and reinforced expectations for upcoming appointment including general education regarding: appt flow, clinic environment, care team, and appropriate points of contact. OPN encouraged patient to return to PCP or referring physician if any medical needs arise prior to consult.    Our team is available to assist patient with any possible non-medical barriers that may impact the patient for the consult appointment.    Navigation Care Plan:  Patient verbalized understanding of information provided and expressed appreciation for proactive support. If the patient pursues treatment delivered at participating [facility] locations, the OPN will provide further/ongoing patient, family, and care partner resource education, support and referral for non-medical needs.

## 2023-03-03 NOTE — Unmapped (Signed)
Error

## 2023-03-03 NOTE — Unmapped (Signed)
[facility] Central Navigation: Outreach Attempt    Oncology Patient Navigator attempted to reach patient to conduct pre-consult assessment.  (Attempt 1 of 3)    Patient was unavailable. A HIPAA compliant voicemail was left for patient to return call. Health Central Navigation: Outreach Attempt    Oncology Patient Navigator attempted to reach patient to conduct pre-consult assessment.  (Attempt 1 of 3)    Patient was unavailable. A HIPAA compliant voicemail was left for patient to return call.

## 2023-03-06 NOTE — Unmapped (Signed)
Provided follow-up counseling to [facility], 62 y.o. for treatment of tobacco use/dependence.     SUMMARY: Counselor re-assessed pt's tobacco use status and followed up about treatment plan. Pt endorsed continued daily tobacco use and expressed ongoing interest in quitting. Pt identified work-related stressors and others smoking in the home as recurring triggers for smoking behaviors. Pt stated that she has been experiencing recent stomach pains that have prevented her from smoking frequently, and pt described pain as it's like I'm breathing in hot air [when I smoke] that goes to my stomach, and it makes it worse. Pt also shared that she had spoken with her daughter regarding joint efforts towards smoking cessation and that her daughter was not ready to quit her own tobacco use at this time. Pt identified her siblings as other supports for encouraging smoking cessation and working towards her goal. Pt denied incorporating 21mg  nicotine patches at this time, and counselor and pt reviewed health education regarding benefits of applying a nicotine patch to manage long-acting cravings to smoke and gradually decrease daily use. Pt was receptive to health education. Counselor made plans to follow up with pt.    Tobacco Use Treatment  Program: [facility]  Type of Visit: Follow-up  Session Number: 2  Tobacco Use Treatment Visit: Talked with patient  Permission To Engage In Conversation Re: PHI w/ Visitors Present: n/a  Goals Of Session: Assessment, Communication of feelings, Problem-solving skill development, Behavior management, improve    Cancer Center Patients  Primary Service: Hem Onc  Primary Cancer Type: Gastrointestinal (GI)    Tobacco Use During Past 30 Days  Time Since Last Tobacco Use: smoked a cigarette today (at least one puff)  Tobacco Withdrawal (Past 24 Hours): Desire or craving to smoke  Type of Tobacco Products Used: Cigarettes  Quantity Used: 10 (I couldn't tell you)  Quantity Per: day  Time to First Use After Waking: Immediately  Wake During Sleep to Smoke: Some nights  Other Household Members Use Tobacco: Yes (Daughter smokes)  Smoking Allowed in Home: Yes  Smoke During Work Day: No    Tobacco Use History  Age Began Use (Years Old): 16  Longest Time Without Tobacco: Days  Most Recent Attempt: Currently working on  Medications Used in Past Attempts: None    Behavioral Assessment  Why Uses: 1. Habit 2. Stress management  Reasons to Become Tobacco Free: 1. Health 2. Family 3. Recovery 4. Qualify for kidney transplant  Barriers/Challenges: 1. Long-standing habit 2. Stress coping 3. Daughter smokes indoors 4. Work-related stress 5. Ritual cues with waking and sleeping  Strategies: 1. 21mg  nicotine patch 2. Psychoeducation on distress tolerance    Treatment Plan  Cessation Meds Currently Using: None  Outpatient/Discharge Medications Recommended: Patch 21mg   Plan to Obtain Outpatient Meds: Pt already has medication  Patient's Plan Post Discharge/Visit: Plan to quit in next 6 months  Follow-up Plan: Permission given, Phone follow-up scheduled  Family Members Included in Intervention/Plan: No (Daughter not willing to quit smoking at this time)    TTS Information  Diagnosis: Tobacco use disorder, unspecified, uncomplicated (F17.200)  Interventions: Assessed, Discussed, Recovery skills training, Suggested, Tx plan development, Supportive therapy, Encouraged  TTS Visit Length: >10 minutes    Damichael Hofman Riki Rusk) [place], [place], LCAS-A  Tobacco Treatment Counselor  [facility] Tobacco Treatment Program  [phone]

## 2023-03-07 ENCOUNTER — Ambulatory Visit
Admit: 2023-03-07 | Discharge: 2023-03-12 | Disposition: A | Discharge status: Home / Self Care | Payer: PRIVATE HEALTH INSURANCE | Admitting: Neurology

## 2023-03-07 ENCOUNTER — Encounter
Admit: 2023-03-07 | Discharge: 2023-03-12 | Disposition: A | Discharge status: Home / Self Care | Payer: PRIVATE HEALTH INSURANCE | Admitting: Neurology

## 2023-03-07 DIAGNOSIS — Z79899 Other long term (current) drug therapy: Principal | ICD-10-CM

## 2023-03-07 DIAGNOSIS — C259 Malignant neoplasm of pancreas, unspecified: Principal | ICD-10-CM

## 2023-03-07 LAB — CBC W/ AUTO DIFF
BASOPHILS ABSOLUTE COUNT: 0 10*9/L (ref 0.0–0.1)
BASOPHILS ABSOLUTE COUNT: 0.1 10*9/L (ref 0.0–0.1)
BASOPHILS ABSOLUTE COUNT: 0 10*9/L (ref 0.0–0.1)
BASOPHILS RELATIVE PERCENT: 0.2 %
BASOPHILS RELATIVE PERCENT: 0.3 %
BASOPHILS RELATIVE PERCENT: 0.1 %
EOSINOPHILS ABSOLUTE COUNT: 0 10*9/L (ref 0.0–0.5)
EOSINOPHILS ABSOLUTE COUNT: 0 10*9/L (ref 0.0–0.5)
EOSINOPHILS ABSOLUTE COUNT: 0 10*9/L (ref 0.0–0.5)
EOSINOPHILS RELATIVE PERCENT: 0.1 %
EOSINOPHILS RELATIVE PERCENT: 0.2 %
EOSINOPHILS RELATIVE PERCENT: 0.1 %
HEMATOCRIT: 37.3 % (ref 34.0–44.0)
HEMATOCRIT: 38.8 % (ref 34.0–44.0)
HEMATOCRIT: 33.2 % — ABNORMAL LOW (ref 34.0–44.0)
HEMOGLOBIN: 12.5 g/dL (ref 11.3–14.9)
HEMOGLOBIN: 12.8 g/dL (ref 11.3–14.9)
HEMOGLOBIN: 11.1 g/dL — ABNORMAL LOW (ref 11.3–14.9)
LYMPHOCYTES ABSOLUTE COUNT: 1.4 10*9/L (ref 1.1–3.6)
LYMPHOCYTES ABSOLUTE COUNT: 2.5 10*9/L (ref 1.1–3.6)
LYMPHOCYTES ABSOLUTE COUNT: 0.5 10*9/L — ABNORMAL LOW (ref 1.1–3.6)
LYMPHOCYTES RELATIVE PERCENT: 17.2 %
LYMPHOCYTES RELATIVE PERCENT: 12.4 %
LYMPHOCYTES RELATIVE PERCENT: 5.8 %
MEAN CORPUSCULAR HEMOGLOBIN CONC: 33.4 g/dL (ref 32.0–36.0)
MEAN CORPUSCULAR HEMOGLOBIN CONC: 33 g/dL (ref 32.0–36.0)
MEAN CORPUSCULAR HEMOGLOBIN CONC: 33.3 g/dL (ref 32.0–36.0)
MEAN CORPUSCULAR HEMOGLOBIN: 29.6 pg (ref 25.9–32.4)
MEAN CORPUSCULAR HEMOGLOBIN: 28.5 pg (ref 25.9–32.4)
MEAN CORPUSCULAR HEMOGLOBIN: 29 pg (ref 25.9–32.4)
MEAN CORPUSCULAR VOLUME: 88.4 fL (ref 77.6–95.7)
MEAN CORPUSCULAR VOLUME: 86.4 fL (ref 77.6–95.7)
MEAN CORPUSCULAR VOLUME: 87.1 fL (ref 77.6–95.7)
MEAN PLATELET VOLUME: 8.3 fL (ref 6.8–10.7)
MEAN PLATELET VOLUME: 8.2 fL (ref 6.8–10.7)
MEAN PLATELET VOLUME: 8.4 fL (ref 6.8–10.7)
MONOCYTES ABSOLUTE COUNT: 0.8 10*9/L (ref 0.3–0.8)
MONOCYTES ABSOLUTE COUNT: 3.3 10*9/L — ABNORMAL HIGH (ref 0.3–0.8)
MONOCYTES ABSOLUTE COUNT: 0.6 10*9/L (ref 0.3–0.8)
MONOCYTES RELATIVE PERCENT: 9.5 %
MONOCYTES RELATIVE PERCENT: 16.3 %
MONOCYTES RELATIVE PERCENT: 7.2 %
NEUTROPHILS ABSOLUTE COUNT: 6 10*9/L (ref 1.8–7.8)
NEUTROPHILS ABSOLUTE COUNT: 14.2 10*9/L — ABNORMAL HIGH (ref 1.8–7.8)
NEUTROPHILS ABSOLUTE COUNT: 7.3 10*9/L (ref 1.8–7.8)
NEUTROPHILS RELATIVE PERCENT: 73 %
NEUTROPHILS RELATIVE PERCENT: 70.8 %
NEUTROPHILS RELATIVE PERCENT: 86.8 %
PLATELET COUNT: 75 10*9/L — ABNORMAL LOW (ref 150–450)
PLATELET COUNT: 119 10*9/L — ABNORMAL LOW (ref 150–450)
PLATELET COUNT: 71 10*9/L — ABNORMAL LOW (ref 150–450)
RED BLOOD CELL COUNT: 4.22 10*12/L (ref 3.95–5.13)
RED BLOOD CELL COUNT: 4.49 10*12/L (ref 3.95–5.13)
RED BLOOD CELL COUNT: 3.82 10*12/L — ABNORMAL LOW (ref 3.95–5.13)
RED CELL DISTRIBUTION WIDTH: 14.7 % (ref 12.2–15.2)
RED CELL DISTRIBUTION WIDTH: 14.6 % (ref 12.2–15.2)
RED CELL DISTRIBUTION WIDTH: 14.8 % (ref 12.2–15.2)
WBC ADJUSTED: 8.2 10*9/L (ref 3.6–11.2)
WBC ADJUSTED: 20 10*9/L — ABNORMAL HIGH (ref 3.6–11.2)
WBC ADJUSTED: 8.4 10*9/L (ref 3.6–11.2)

## 2023-03-07 LAB — TOXICOLOGY SCREEN, URINE
AMPHETAMINE SCREEN URINE: NEGATIVE
BARBITURATE SCREEN URINE: NEGATIVE
BENZODIAZEPINE SCREEN, URINE: POSITIVE — AB
BUPRENORPHINE, URINE SCREEN: NEGATIVE
CANNABINOID SCREEN URINE: NEGATIVE
COCAINE(METAB.)SCREEN, URINE: NEGATIVE
FENTANYL SCREEN, URINE: NEGATIVE
METHADONE SCREEN, URINE: NEGATIVE
OPIATE SCREEN URINE: NEGATIVE
OXYCODONE SCREEN URINE: NEGATIVE

## 2023-03-07 LAB — COMPREHENSIVE METABOLIC PANEL
ALBUMIN: 3.5 g/dL (ref 3.4–5.0)
ALBUMIN: 3.8 g/dL (ref 3.4–5.0)
ALKALINE PHOSPHATASE: 501 U/L — ABNORMAL HIGH (ref 46–116)
ALKALINE PHOSPHATASE: 510 U/L — ABNORMAL HIGH (ref 46–116)
ALT (SGPT): 237 U/L — ABNORMAL HIGH (ref 10–49)
ALT (SGPT): 229 U/L — ABNORMAL HIGH (ref 10–49)
ANION GAP: 10 mmol/L (ref 5–14)
ANION GAP: 12 mmol/L (ref 5–14)
AST (SGOT): 162 U/L — ABNORMAL HIGH (ref <=34)
AST (SGOT): 127 U/L — ABNORMAL HIGH (ref <=34)
BILIRUBIN TOTAL: 0.7 mg/dL (ref 0.3–1.2)
BILIRUBIN TOTAL: 0.6 mg/dL (ref 0.3–1.2)
BLOOD UREA NITROGEN: 69 mg/dL — ABNORMAL HIGH (ref 9–23)
BLOOD UREA NITROGEN: 61 mg/dL — ABNORMAL HIGH (ref 9–23)
BUN / CREAT RATIO: 15
BUN / CREAT RATIO: 14
CALCIUM: 9 mg/dL (ref 8.7–10.4)
CALCIUM: 9.1 mg/dL (ref 8.7–10.4)
CHLORIDE: 105 mmol/L (ref 98–107)
CHLORIDE: 111 mmol/L — ABNORMAL HIGH (ref 98–107)
CO2: 21 mmol/L (ref 20.0–31.0)
CO2: 19 mmol/L — ABNORMAL LOW (ref 20.0–31.0)
CREATININE: 4.6 mg/dL — ABNORMAL HIGH
CREATININE: 4.48 mg/dL — ABNORMAL HIGH
EGFR CKD-EPI (2021) FEMALE: 10 mL/min/{1.73_m2} — ABNORMAL LOW (ref >=60)
EGFR CKD-EPI (2021) FEMALE: 11 mL/min/{1.73_m2} — ABNORMAL LOW (ref >=60)
GLUCOSE RANDOM: 243 mg/dL — ABNORMAL HIGH (ref 70–179)
GLUCOSE RANDOM: 20 mg/dL — CL (ref 70–179)
POTASSIUM: 4.9 mmol/L — ABNORMAL HIGH (ref 3.4–4.8)
POTASSIUM: 4.6 mmol/L (ref 3.4–4.8)
PROTEIN TOTAL: 7.3 g/dL (ref 5.7–8.2)
PROTEIN TOTAL: 7.7 g/dL (ref 5.7–8.2)
SODIUM: 136 mmol/L (ref 135–145)
SODIUM: 142 mmol/L (ref 135–145)

## 2023-03-07 LAB — BLOOD GAS CRITICAL CARE PANEL, VENOUS
BASE EXCESS VENOUS: -5.9 — ABNORMAL LOW (ref -2.0–2.0)
BASE EXCESS VENOUS: -8.5 — ABNORMAL LOW (ref -2.0–2.0)
CALCIUM IONIZED VENOUS (MG/DL): 4.67 mg/dL (ref 4.40–5.40)
CALCIUM IONIZED VENOUS (MG/DL): 4.84 mg/dL (ref 4.40–5.40)
FIO2 VENOUS: 100
GLUCOSE WHOLE BLOOD: 18 mg/dL — CL (ref 70–179)
GLUCOSE WHOLE BLOOD: 107 mg/dL (ref 70–179)
HCO3 VENOUS: 19 mmol/L — ABNORMAL LOW (ref 22–27)
HCO3 VENOUS: 17 mmol/L — ABNORMAL LOW (ref 22–27)
HEMOGLOBIN BLOOD GAS: 12.4 g/dL
HEMOGLOBIN BLOOD GAS: 10.9 g/dL — ABNORMAL LOW
LACTATE BLOOD VENOUS: 0.9 mmol/L (ref 0.5–1.8)
LACTATE BLOOD VENOUS: 0.7 mmol/L (ref 0.5–1.8)
O2 SATURATION VENOUS: 99.6 % — ABNORMAL HIGH (ref 40.0–85.0)
O2 SATURATION VENOUS: 99.8 % — ABNORMAL HIGH (ref 40.0–85.0)
PCO2 VENOUS: 41 mmHg (ref 40–60)
PCO2 VENOUS: 35 mmHg — ABNORMAL LOW (ref 40–60)
PH VENOUS: 7.3 — ABNORMAL LOW (ref 7.32–7.43)
PH VENOUS: 7.3 — ABNORMAL LOW (ref 7.32–7.43)
PO2 VENOUS: 223 mmHg — ABNORMAL HIGH (ref 30–55)
PO2 VENOUS: 262 mmHg — ABNORMAL HIGH (ref 30–55)
POTASSIUM WHOLE BLOOD: 6.2 mmol/L — ABNORMAL HIGH (ref 3.4–4.6)
POTASSIUM WHOLE BLOOD: 4.2 mmol/L (ref 3.4–4.6)
SODIUM WHOLE BLOOD: 139 mmol/L (ref 135–145)
SODIUM WHOLE BLOOD: 135 mmol/L (ref 135–145)

## 2023-03-07 LAB — BASIC METABOLIC PANEL
ANION GAP: 10 mmol/L (ref 5–14)
BLOOD UREA NITROGEN: 66 mg/dL — ABNORMAL HIGH (ref 9–23)
BUN / CREAT RATIO: 16
CALCIUM: 8.4 mg/dL — ABNORMAL LOW (ref 8.7–10.4)
CHLORIDE: 113 mmol/L — ABNORMAL HIGH (ref 98–107)
CO2: 17 mmol/L — ABNORMAL LOW (ref 20.0–31.0)
CREATININE: 4.1 mg/dL — ABNORMAL HIGH
EGFR CKD-EPI (2021) FEMALE: 12 mL/min/{1.73_m2} — ABNORMAL LOW (ref >=60)
GLUCOSE RANDOM: 116 mg/dL — ABNORMAL HIGH (ref 70–99)
POTASSIUM: 4.5 mmol/L (ref 3.4–4.8)
SODIUM: 140 mmol/L (ref 135–145)

## 2023-03-07 LAB — HIGH SENSITIVITY TROPONIN I - 2H/6H SERIAL
HIGH SENSITIVITY TROPONIN - DELTA (0-2H): 4 ng/L (ref <=7)
HIGH-SENSITIVITY TROPONIN I - 2 HOUR: 118 ng/L (ref <=34)

## 2023-03-07 LAB — URINALYSIS WITH MICROSCOPY
BACTERIA: NONE SEEN /HPF
BILIRUBIN UA: NEGATIVE
GLUCOSE UA: 100 — AB
KETONES UA: NEGATIVE
LEUKOCYTE ESTERASE UA: NEGATIVE
NITRITE UA: NEGATIVE
PH UA: 7 (ref 5.0–9.0)
PROTEIN UA: 70 — AB
RBC UA: 2 /HPF (ref <=4)
SPECIFIC GRAVITY UA: 1.013 (ref 1.003–1.030)
SQUAMOUS EPITHELIAL: <1 /HPF (ref 0–5)
UROBILINOGEN UA: <2
WBC UA: <1 /HPF (ref 0–5)

## 2023-03-07 LAB — BLOOD GAS, ARTERIAL
BASE EXCESS ARTERIAL: -8.3 — ABNORMAL LOW (ref -2.0–2.0)
HCO3 ARTERIAL: 17 mmol/L — ABNORMAL LOW (ref 22–27)
O2 SATURATION ARTERIAL: 100 % (ref 94.0–100.0)
PCO2 ARTERIAL: 35 mmHg (ref 35.0–45.0)
PH ARTERIAL: 7.3 — ABNORMAL LOW (ref 7.35–7.45)
PO2 ARTERIAL: 395 mmHg — ABNORMAL HIGH (ref 80.0–110.0)

## 2023-03-07 LAB — HIGH SENSITIVITY TROPONIN I - SINGLE
HIGH SENSITIVITY TROPONIN I: 122 ng/L (ref <=34)
HIGH SENSITIVITY TROPONIN I: 143 ng/L (ref <=34)
HIGH SENSITIVITY TROPONIN I: 132 ng/L (ref <=34)

## 2023-03-07 LAB — BETA HYDROXYBUTYRATE: BETA-HYDROXYBUTYRATE: 0.12 mmol/L (ref 0.02–0.27)

## 2023-03-07 LAB — TSH: THYROID STIMULATING HORMONE: 0.569 u[IU]/mL (ref 0.550–4.780)

## 2023-03-07 LAB — LIPID PANEL
CHOLESTEROL/HDL RATIO SCREEN: 2.4 (ref 1.0–4.5)
CHOLESTEROL: 138 mg/dL (ref <=200)
HDL CHOLESTEROL: 58 mg/dL (ref 40–60)
LDL CHOLESTEROL CALCULATED: 38 mg/dL — ABNORMAL LOW (ref 40–99)
NON-HDL CHOLESTEROL: 80 mg/dL (ref 70–130)
TRIGLYCERIDES: 208 mg/dL — ABNORMAL HIGH (ref 0–150)
VLDL CHOLESTEROL CAL: 41.6 mg/dL — ABNORMAL HIGH (ref 11–41)

## 2023-03-07 LAB — C-REACTIVE PROTEIN: C-REACTIVE PROTEIN: 37 mg/L — ABNORMAL HIGH (ref <=10.0)

## 2023-03-07 LAB — CORTISOL: CORTISOL TOTAL: 37.7 ug/dL

## 2023-03-07 LAB — TRIGLYCERIDES: TRIGLYCERIDES: 208 mg/dL — ABNORMAL HIGH (ref 0–150)

## 2023-03-07 MED ADMIN — gadopiclenol injection 6.8 mL: 6.8 mL | INTRAVENOUS | @ 21:00:00 | Stop: 2023-03-07

## 2023-03-07 MED ADMIN — naloxone (NARCAN) injection 0.2 mg: .2 mg | INTRAVENOUS | @ 23:00:00 | Stop: 2023-03-07

## 2023-03-07 MED ADMIN — aspirin tablet 325 mg: 325 mg | ORAL | @ 19:00:00 | Stop: 2023-03-07

## 2023-03-07 MED ADMIN — levETIRAcetam (KEPPRA) injection 4,000 mg: 4000 mg | INTRAVENOUS | Stop: 2023-03-07

## 2023-03-07 MED ADMIN — oxyCODONE (ROXICODONE) immediate release tablet 5 mg: 5 mg | ORAL | @ 20:00:00 | Stop: 2023-03-07

## 2023-03-07 MED ADMIN — LORazepam (ATIVAN) 2 mg/mL injection: Stop: 2023-03-07

## 2023-03-07 MED ADMIN — levETIRAcetam (KEPPRA) 500 mg/5 mL injection: INTRAVENOUS | Stop: 2023-03-07

## 2023-03-07 NOTE — Unmapped (Signed)
Pt had stroke in late September.

## 2023-03-07 NOTE — Unmapped (Signed)
Initial Consult Note        Requesting Attending Physician:  [name], MD  Service Requesting Consult: Emergency Medicine     Assessment and Plan          Amy [name] is a 62 y.o. right handed female with history of ESRD, recent R occipital diffusion restriction lesion on MRI, and newly diagnosed pancreatic adenocarcinoma on whom I have been asked by [name], MD to consult regarding imaging recommendations.    Patient presents to ED with vague neurologic symptoms including resolved blurry vision and feeling out of focus. She reports that she woke up this morning with blurry vision in both eyes that resolved after putting her glasses back on and feeling off. She was recently diagnosed with metastatic pancreatic adenocarcinoma and was found to have R occipital diffusion restricting lesion concerning for stroke vs. Metastasis in September 2024. She reports feeling similar to that presentation which prompted this visit to the ED. On exam, patient has no new focal neurologic deficits. Her mental status is intact and she has known L visual field cut from R occipital lesion. Differential for her subjective confusion is broad including underlying toxic/metabolic etiologies versus new onset ischemia versus metastases. Prior MRI Brain wwo contrast in September 2024 shows R occipital diffusion restricting lesion shows some enhancement but is a poor study due to motion artifact. Patient would benefit from repeat MRI Brain to better visualize this area.     Recommendations:   - MRI Brain with and without contrast to further evaluate R occipital lesion  - Patient has follow up with neurology 03/10/23    This patient was seen and discussed with Dr.  Fabian November , who agrees with the above assessment and plan.      [name], MD   PGY2 Neurology       ATTESTATION NOTE:  I saw and evaluated the patient, participating in the key portions of the service.  I reviewed the resident???s note and agree with the resident???s findings and plan as documented in their note.    [name], DO  Assistant Professor  Department of Neurology  [place] [facility]         HPI        Reason for Consult: Imaging needed to evaluate CVA vs R occipital met    Amy [name] is a 62 y.o. right handed female on whom I have been asked by [name], MD to consult for imaging recommendations in patient with recent CVA/R occipital met with new vision issues and confusion.    Patient reports that this morning she woke up and felt off. She reports that she had blurry vision that felt like a film over both eyes. The blurry vision resolved after putting her glasses back on. However, she continued to feel off and out of focus. She did not feel that it was safe for her to drive to work so she called her daughter who brought her to the ED. On further questioning, patient reports that she may have been feeling off starting yesterday but she is unclear what the exact feeling is. She denies any new visual changes outside of the description above, focal weakness, numbness, loss of consciousness, or seizure like activity. She endorses severe abdominal pain that has been present for many weeks. She takes pregabalin for abdominal pain, and has been taking it twice a day.     She was recently seen by Neurology in the ED 02/10/23  for L visual field loss and found to have R occipital lesion with diffusion restriction. There were a few other areas concerning for metastasis as well due to her history of recently diagnosed pancreatic cancer. She was started on aspirin, continued on statin. Echocardiogram was normal with no evidence of thrombus.     She recently saw medical oncology. They recommended repeat MRI Brain as the one in ED was motion degraded and it was hard to characterize R occipital lesion. Pathology from abdominal metastasis was + for pancreatic adenocarcinoma.     Allergies   Allergen Reactions Sulfur-8 Nausea Only    Glucophage [Metformin]     Codeine Itching    Morphine Itching      Current Facility-Administered Medications   Medication Dose Route Frequency Provider Last Rate Last Admin    gadopiclenol injection 6.8 mL  6.8 mL Intravenous Once in imaging Migliaccio, Ulla Gallo, MD         Current Outpatient Medications   Medication Sig Dispense Refill    aspirin (ECOTRIN) 81 MG tablet Take 1 tablet (81 mg total) by mouth daily. 30 tablet 0    blood sugar diagnostic (TRUE METRIX GLUCOSE TEST STRIP) Strp True Metrix Glucose Test Strip      cholecalciferol, vitamin D3-25 mcg, 1,000 unit,, 25 mcg (1,000 unit) capsule Take 1 capsule (25 mcg total) by mouth daily.      clobetasol (TEMOVATE) 0.05 % external solution Apply topically two (2) times a day. To scalp 50 mL 3    clobetasol (TEMOVATE) 0.05 % ointment Apply to your thick psoriasis areas twice daily until clear 60 g 5    cyclobenzaprine (FLEXERIL) 10 MG tablet Take 1 tablet (10 mg total) by mouth nightly.      ferric citrate (AURYXIA) 210 mg iron Tab tablet Take 1 tablet (210 mg total) by mouth Three (3) times a day with a meal. (Patient not taking: Reported on 02/01/2023) 90 tablet 11    lisinopril (PRINIVIL,ZESTRIL) 10 MG tablet Take 1 tablet (10 mg total) by mouth daily.      nicotine (NICODERM CQ) 14 mg/24 hr patch Place 1 patch on the skin daily. Remove old patch before applying new patch on skin. After 8 weeks, switch to 7 mg patches. 56 patch 0    nicotine (NICODERM CQ) 21 mg/24 hr patch Place 1 patch on the skin daily. Remove old patch before applying new patch on skin. After 8 weeks, switch to 14 mg patches. 56 patch 0    nicotine (NICODERM CQ) 7 mg/24 hr patch Place 1 patch on the skin daily for 28 days. 28 patch 0    NOVOLOG FLEXPEN U-100 INSULIN 100 unit/mL (3 mL) injection pen       oxyCODONE (ROXICODONE) 5 MG immediate release tablet Take 1 tablet (5 mg total) by mouth every four (4) hours as needed for pain. 10 tablet 0    pen needle, diabetic 32 gauge x 5/32 (4 mm) Ndle Inject as directed Three (3) times a day before meals.      pregabalin (LYRICA) 25 MG capsule       rosuvastatin (CRESTOR) 10 MG tablet Take by mouth.      SEMGLEE,INSULIN GLARG-YFGN,PEN 100 unit/mL (3 mL) InPn       SYMBICORT 160-4.5 mcg/actuation inhaler       TRUEPLUS PEN NEEDLE 32 gauge x 5/32 Ndle   10    ustekinumab (STELARA) 45 mg/0.5 mL Syrg syringe Inject the contents of 1 syringe (45 mg) under  the skin once every 12 weeks. 0.5 mL 4    (Not in a hospital admission)      Past Medical History:   Diagnosis Date    Alport syndrome     Back pain     Chronic kidney disease     Dental abscess     Diabetes mellitus (CMS-HCC)     type 2, don't remember A1C, controlled with insulin injections; okay per physician at last visit    Hyperlipidemia     Hypertension     well controlled with meds    Kidney stone     no problems since 1995    Psoriasis        Past Surgical History:   Procedure Laterality Date    BREAST BIOPSY Right 05/30/2018    Intraductal papilloma w/ florid DH & apocrine metaplasia    CATARACT EXTRACTION Right 08/29/2017    COLONOSCOPY      cyst removal in breast      HYSTERECTOMY      NEPHROSTOMY      OOPHORECTOMY      ONLY ONE OVARY REMOVED    PR COLSC FLX W/RMVL OF TUMOR POLYP LESION SNARE TQ N/A 06/19/2018    Procedure: COLONOSCOPY FLEX; W/REMOV TUMOR/LES BY SNARE;  Surgeon: Jarvis Morgan, MD;  Location: [facility];  Service: Gastroenterology    PR XCAPSL CTRC RMVL INSJ IO LENS PROSTH W/O ECP Right 08/29/2017    Procedure: EXTRACAPSULAR CATARACT REMOVAL W/INSERTION OF INTRAOCULAR LENS PROSTHESIS, MANUAL OR MECHANICAL TECHNIQUE Lens Choice:OD ZCB00 +19.50 [id number] +19.00/+18.00;  Surgeon: O'Rese Bonney Roussel, MD;  Location: [facility];  Service: Ophthalmology    PR XCAPSL CTRC RMVL INSJ IO LENS PROSTH W/O ECP Left 09/19/2017    Procedure: EXTRACAPSULAR CATARACT REMOVAL W/INSERTION OF INTRAOCULAR LENS PROSTHESIS, MANUAL OR MECHANICAL TECHNIQUE Lens Choice: ZCB00 +21.00; ZA9003 +20.00/+19.00;  Surgeon: O'Rese Bonney Roussel, MD;  Location: [facility] OR [facility];  Service: Ophthalmology       Social History     Socioeconomic History    Marital status: Single   Tobacco Use    Smoking status: Former     Current packs/day: 0.00     Average packs/day: 1 pack/day for 43.0 years (43.0 ttl pk-yrs)     Types: Cigarettes     Start date: 02/12/1978     Quit date: 02/12/2021     Years since quitting: 2.0    Smokeless tobacco: Never   Vaping Use    Vaping status: Every Day   Substance and Sexual Activity    Alcohol use: No    Drug use: No    Sexual activity: Not Currently   Other Topics Concern    Do you use sunscreen? No    Tanning bed use? Yes     Comment: USED IN PAST    Are you easily burned? No    Excessive sun exposure? No    Blistering sunburns? Yes     Social Determinants of Health     Financial Resource Strain: Low Risk  (03/03/2023)    Overall Financial Resource Strain (CARDIA)     Difficulty of Paying Living Expenses: Not hard at all   Food Insecurity: No Food Insecurity (03/03/2023)    Hunger Vital Sign     Worried About Running Out of Food in the Last Year: Never true     Ran Out of Food in the Last Year: Never true   Transportation Needs: No Transportation Needs (03/03/2023)  PRAPARE - Therapist, art (Medical): No     Lack of Transportation (Non-Medical): No   Physical Activity: Unknown (08/29/2017)    Exercise Vital Sign     Days of Exercise per Week: Patient declined     Minutes of Exercise per Session: Patient declined   Social Connections: Unknown (08/29/2017)    Social Connection and Isolation Panel [NHANES]     Frequency of Communication with Friends and Family: Patient declined     Frequency of Social Gatherings with Friends and Family: Patient declined     Attends Religious Services: Patient declined     Database administrator or Organizations: Patient declined     Attends Engineer, structural: Patient declined     Marital Status: Patient declined     Family History   Problem Relation Age of Onset    Kidney disease Mother     Diabetes Mother     Cataracts Mother     Heart attack Father     No Known Problems Daughter     Cancer Paternal Grandmother     No Known Problems Other     Breast cancer Neg Hx     Colon cancer Neg Hx     Endometrial cancer Neg Hx     Ovarian cancer Neg Hx     Melanoma Neg Hx     Basal cell carcinoma Neg Hx     Squamous cell carcinoma Neg Hx     BRCA 1/2 Neg Hx        Code Status: No Order     Review of Systems     A 10-system review of systems was conducted and was negative except as documented above in the HPI.       Objective        Temp:  [36.8 ??C (98.3 ??F)] 36.8 ??C (98.3 ??F)  Heart Rate:  [62-87] 62  SpO2 Pulse:  [63-67] 63  Resp:  [13-17] 13  BP: (114-129)/(62-68) 114/68  MAP (mmHg):  [81-82] 81  SpO2:  [97 %-100 %] 100 %  No intake/output data recorded.    Physical Exam:    General Exam:  General Appearance:Chronically ill appearing. In no acute distress.  HEENT: Head is atraumatic and normocephalic. Sclera anicteric without injection. Oropharyngeal membranes are moist with no erythema or exudate.  Neck: Grossly normal range of motion.  Lungs: Normal work of breathing. On room air  Heart: warm, well perfused  Abdomen: Nondistended.  Extremities: No clubbing or cyanosis.    Neurological Exam:  Mental Status: Alert, conversant, able to follow conversation and interview. Oriented to self, place, date. Spontaneous speech was fluent without word finding pauses, dysarthria, or paraphasic errors. Comprehension was intact. Memory for recent and remote events was intact. Follows simple and complex commands, able to spell world backwords.  Cranial Nerves: PERRL. Pursuit eye movements were uninterrupted with full range and without more than end-gaze nystagmus. L homonomymous hemianopia. Facial sensation intact bilaterally to light touch in all three divisions of CNV. Face symmetric at rest. Normal facial movement bilaterally, including forehead, eye closure and grimace/smile. Hearing intact to conversation. Shoulder shrug full strength bilaterally. Palate movement is symmetric. Tongue protrudes midline and tongue movements are normal.   Motor Exam: Normal bulk.  No tremors, myoclonus, or other adventitious movement.  Pronator drift is absent.  UE R/L: deltoid 5/5, biceps 5/5, triceps 5/5, and hand grip strong/strong.  LE R/L: hip flexion 5/5, quadriceps 5/5, hamstrings 5/5,  dorsiflexion 5/5, and plantar flexion 5/5.  Reflexes: DTRs are 2+ and symmetric throughout. Toes are downgoing bilaterally.  Sensory: Grossly intact to light touch in all extremities.     Cerebellar/Coordination/Gait: Finger-to-nose is normal without ataxia or dysmetria bilaterally. Heel-to-shin is normal without ataxia or dysmetria bilaterally. Romberg +.     Diagnostic Studies      All Labs Last 24hrs:   Recent Results (from the past 24 hour(s))   ECG 12 Lead    Collection Time: 03/07/23  1:22 PM   Result Value Ref Range    EKG Systolic BP  mmHg    EKG Diastolic BP  mmHg    EKG Ventricular Rate 60 BPM    EKG Atrial Rate 60 BPM    EKG P-R Interval 164 ms    EKG QRS Duration 82 ms    EKG Q-T Interval 404 ms    EKG QTC Calculation 404 ms    EKG Calculated P Axis 65 degrees    EKG Calculated R Axis -17 degrees    EKG Calculated T Axis -25 degrees    QTC Fredericia 404 ms   Comprehensive metabolic panel    Collection Time: 03/07/23  1:24 PM   Result Value Ref Range    Sodium 136 135 - 145 mmol/L    Potassium 4.9 (H) 3.4 - 4.8 mmol/L    Chloride 105 98 - 107 mmol/L    CO2 21.0 20.0 - 31.0 mmol/L    Anion Gap 10 5 - 14 mmol/L    BUN 69 (H) 9 - 23 mg/dL    Creatinine 4.54 (H) 0.55 - 1.02 mg/dL    BUN/Creatinine Ratio 15     eGFR CKD-EPI (2021) Female 10 (L) >=60 mL/min/1.31m2    Glucose 243 (H) 70 - 179 mg/dL    Calcium 9.0 8.7 - 09.8 mg/dL    Albumin 3.5 3.4 - 5.0 g/dL    Total Protein 7.3 5.7 - 8.2 g/dL    Total Bilirubin 0.7 0.3 - 1.2 mg/dL    AST 119 (H) <=14 U/L    ALT 237 (H) 10 - 49 U/L    Alkaline Phosphatase 501 (H) 46 - 116 U/L   hsTroponin I (single, no delta)    Collection Time: 03/07/23  1:24 PM   Result Value Ref Range    hsTroponin I 122 (HH) <=34 ng/L   CBC w/ Differential    Collection Time: 03/07/23  1:24 PM   Result Value Ref Range    WBC 8.2 3.6 - 11.2 10*9/L    RBC 4.22 3.95 - 5.13 10*12/L    HGB 12.5 11.3 - 14.9 g/dL    HCT 78.2 95.6 - 21.3 %    MCV 88.4 77.6 - 95.7 fL    MCH 29.6 25.9 - 32.4 pg    MCHC 33.4 32.0 - 36.0 g/dL    RDW 08.6 57.8 - 46.9 %    MPV 8.3 6.8 - 10.7 fL    Platelet 75 (L) 150 - 450 10*9/L    Neutrophils % 73.0 %    Lymphocytes % 17.2 %    Monocytes % 9.5 %    Eosinophils % 0.1 %    Basophils % 0.2 %    Absolute Neutrophils 6.0 1.8 - 7.8 10*9/L    Absolute Lymphocytes 1.4 1.1 - 3.6 10*9/L    Absolute Monocytes 0.8 0.3 - 0.8 10*9/L    Absolute Eosinophils 0.0 0.0 - 0.5 10*9/L    Absolute Basophils 0.0 0.0 -  0.1 10*9/L   ECG 12 Lead    Collection Time: 03/07/23  2:30 PM   Result Value Ref Range    EKG Systolic BP  mmHg    EKG Diastolic BP  mmHg    EKG Ventricular Rate 60 BPM    EKG Atrial Rate 60 BPM    EKG P-R Interval 168 ms    EKG QRS Duration 80 ms    EKG Q-T Interval 414 ms    EKG QTC Calculation 414 ms    EKG Calculated P Axis 62 degrees    EKG Calculated R Axis 0 degrees    EKG Calculated T Axis -7 degrees    QTC Fredericia 414 ms   hsTroponin I (serial 2-6H CONTINUATION w/ delta)    Collection Time: 03/07/23  2:48 PM   Result Value Ref Range    hsTroponin I 118 (HH) <=34 ng/L    delta hsTroponin I 4 <=7 ng/L     MRI Brain wwo contrast, 02/10/23, personally reviewed:  Radiology read: Evaluation limited by motion artifact. Within this limitation,  Findings concerning for intracranial metastatic disease, most prominently in the right occipital lobe.  On review, the following additional findings were noted:  Findings more worrisome for subacute infarct with a likely embolic source given distribution.

## 2023-03-07 NOTE — Unmapped (Signed)
[facility] Triage Note      Last seen in clinic on 10/2 Ophelia Charter FNP  Last Infusion Visit:  PCP: 10/21   Type 2 DM , 9/27 2 mini strokes    Current Symptoms:  Returned call to Ms. Thurmond Butts who states that her mother Ms. Bertoni, had symptoms of severe confusion (daughter spoke to mom during this time and she told her that she was confused and didn't know what was going on) She had blurry vision in both eyes with glasses on when she woke up around 8-9 am unsure how long this lasted for.  Pt stated that when she woke up she felt confused but was able to ready for the day. She felt like she just didn't have it altogether and felt off.     During triage call: her vision is fine, she is not confused and is alert and oriented x 3.  Blood sugar is 133. And answers questions      Denies these symptoms: No pain in the eyes, no headache, no vomiting, no difficulty speaking, no arm or leg weakness.       Triage Recommendations:   I reached out to her team for input due to the complexity of the patient situation and Ophelia Charter FNP advised that she go to the ER.   Relayed this to the daughter and she will take her in.     Caller's Response: Appreciative of the call and is agreeable to this plan      Outstanding tasks: Care team notified

## 2023-03-07 NOTE — Unmapped (Signed)
Emergency Department Provider Note        ED Clinical Impression     Final diagnoses:   Altered mental status, unspecified altered mental status type (Primary)       ED Assessment/Plan     Pt presents with episodic blurry vision (began around 0800 this morning and has since self-resolved), as well as new onset confusion concerning for CVA/TIA vs brain mets given known history of pancreatic adenocarcinoma with unspecified R occipital lesion on brain MRI 9/27. Assessment was performed, neuro consulted.   Stroke risk factors: diabetes mellitus, hypertension, hyperlipidemia, and hypercoagulable state.    Patient has been taking daily rosuvastatin & baby aspirin since ED visit on 02/10/23. 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 No ST changes, no chest pain c/f ACS.    BP 114/68  - Pulse 62  - Temp 36.8 ??C (98.3 ??F) (Oral)  - Resp 13  - Wt 68 kg (150 lb)  - SpO2 100%  - BMI 23.82 kg/m??       History     Chief Complaint   Patient presents with    Altered Mental Status     HPI    Pt presents to ED for concerns of confusion and blurry vision since 0800 this morning. She is accompanied by her daughter. She states that her blurry vision has improved since this morning. She has a PMH of Alport syndrome and nearing ESRD, T2DM, HTN, HLD, pancreatic adenocarcinoma with liver mets, possible R occipital mets vs embolic stroke. She was seen in the ED on 02/10/23 due to blurry vision and vision loss in L eye, and was diagnosed with R occipital lesion, pancreatic adenocarcinoma with liver mets at that time. She has been following with Med Onc, neuro, and optho since her last ED visit. IR liver biopsy on 02/27/23 confirmed invasive adenocarcinoma.     She is unable to characterize the confusion that she felt this morning, but states that something didn't feel right and she felt uncertain about going to work this morning. She reports that she had blurry vision and confusion during prior TIA episodes. She is due to see neuro later this week and have repeat brain MRI to further evaluate R occipital lesion (unclear whether embolic disease vs metastasis on MRI brain 02/10/23). Denies known episodes of seizures or syncope. She endorses mild R arm pain, but denies focal weakness, gait instability, speech changes, loss of hearing.     She recently increased Lyrica to 2 tablets BID given increasing abdominal pain, but denies other recent med changes. Has been taking daily baby ASA since most recent ED visit on 9/27.        Past Medical History:   Diagnosis Date    Alport syndrome     Back pain     Chronic kidney disease     Dental abscess     Diabetes mellitus (CMS-HCC)     type 2, don't remember A1C, controlled with insulin injections; okay per physician at last visit    Hyperlipidemia     Hypertension     well controlled with meds    Kidney stone     no problems since 1995    Psoriasis        Past Surgical History:   Procedure Laterality Date    BREAST BIOPSY Right 05/30/2018    Intraductal papilloma w/ florid DH & apocrine metaplasia    CATARACT EXTRACTION Right 08/29/2017  COLONOSCOPY      cyst removal in breast      HYSTERECTOMY      NEPHROSTOMY      OOPHORECTOMY      ONLY ONE OVARY REMOVED    PR COLSC FLX W/RMVL OF TUMOR POLYP LESION SNARE TQ N/A 06/19/2018    Procedure: COLONOSCOPY FLEX; W/REMOV TUMOR/LES BY SNARE;  Surgeon: Jarvis Morgan, MD;  Location: [facility];  Service: Gastroenterology    PR XCAPSL CTRC RMVL INSJ IO LENS PROSTH W/O ECP Right 08/29/2017    Procedure: EXTRACAPSULAR CATARACT REMOVAL W/INSERTION OF INTRAOCULAR LENS PROSTHESIS, MANUAL OR MECHANICAL TECHNIQUE Lens Choice:OD ZCB00 +19.50 [id number] +19.00/+18.00;  Surgeon: O'Rese Bonney Roussel, MD;  Location: [facility];  Service: Ophthalmology    PR XCAPSL CTRC RMVL INSJ IO LENS PROSTH W/O ECP Left 09/19/2017    Procedure: EXTRACAPSULAR CATARACT REMOVAL W/INSERTION OF INTRAOCULAR LENS PROSTHESIS, MANUAL OR MECHANICAL TECHNIQUE Lens Choice: ZCB00 +21.00; ZA9003 +20.00/+19.00;  Surgeon: O'Rese Bonney Roussel, MD;  Location: [facility] OR [facility];  Service: Ophthalmology       Family History   Problem Relation Age of Onset    Kidney disease Mother     Diabetes Mother     Cataracts Mother     Heart attack Father     No Known Problems Daughter     Cancer Paternal Grandmother     No Known Problems Other     Breast cancer Neg Hx     Colon cancer Neg Hx     Endometrial cancer Neg Hx     Ovarian cancer Neg Hx     Melanoma Neg Hx     Basal cell carcinoma Neg Hx     Squamous cell carcinoma Neg Hx     BRCA 1/2 Neg Hx        Social History     Socioeconomic History    Marital status: Single   Tobacco Use    Smoking status: Former     Current packs/day: 0.00     Average packs/day: 1 pack/day for 43.0 years (43.0 ttl pk-yrs)     Types: Cigarettes     Start date: 02/12/1978     Quit date: 02/12/2021     Years since quitting: 2.0    Smokeless tobacco: Never   Vaping Use    Vaping status: Every Day   Substance and Sexual Activity    Alcohol use: No    Drug use: No    Sexual activity: Not Currently   Other Topics Concern    Do you use sunscreen? No    Tanning bed use? Yes     Comment: USED IN PAST    Are you easily burned? No    Excessive sun exposure? No    Blistering sunburns? Yes     Social Determinants of Health     Financial Resource Strain: Low Risk  (03/03/2023)    Overall Financial Resource Strain (CARDIA)     Difficulty of Paying Living Expenses: Not hard at all   Food Insecurity: No Food Insecurity (03/03/2023)    Hunger Vital Sign     Worried About Running Out of Food in the Last Year: Never true     Ran Out of Food in the Last Year: Never true   Transportation Needs: No Transportation Needs (03/03/2023)    PRAPARE - Therapist, art (Medical): No     Lack of Transportation (Non-Medical): No   Physical  Activity: Unknown (08/29/2017)    Exercise Vital Sign     Days of Exercise per Week: Patient declined     Minutes of Exercise per Session: Patient declined   Social Connections: Unknown (08/29/2017)    Social Connection and Isolation Panel [NHANES]     Frequency of Communication with Friends and Family: Patient declined     Frequency of Social Gatherings with Friends and Family: Patient declined     Attends Religious Services: Patient declined     Database administrator or Organizations: Patient declined     Attends Banker Meetings: Patient declined     Marital Status: Patient declined       Review of Systems   Constitutional:  Negative for activity change, fatigue and fever.   HENT:  Negative for hearing loss.    Eyes:  Positive for visual disturbance. Negative for photophobia.   Respiratory:  Negative for chest tightness and shortness of breath.    Cardiovascular:  Negative for chest pain.   Musculoskeletal:  Negative for neck pain.   Neurological:  Negative for seizures, syncope, facial asymmetry, speech difficulty, weakness, light-headedness, numbness and headaches.   Psychiatric/Behavioral:  Positive for confusion.        Physical Exam     BP 114/68  - Pulse 62  - Temp 36.8 ??C (98.3 ??F) (Oral)  - Resp 13  - Wt 68 kg (150 lb)  - SpO2 100%  - BMI 23.82 kg/m??     Physical Exam  Constitutional:       General: She is not in acute distress.     Appearance: She is well-developed.   HENT:      Head: Normocephalic and atraumatic.      Mouth/Throat:      Mouth: Mucous membranes are moist.   Eyes:      General: No visual field deficit or scleral icterus.     Extraocular Movements: Extraocular movements intact.      Pupils: Pupils are equal, round, and reactive to light. Pupils are equal.   Cardiovascular:      Rate and Rhythm: Normal rate and regular rhythm.      Heart sounds: Normal heart sounds.   Pulmonary:      Effort: Pulmonary effort is normal. No respiratory distress.      Breath sounds: Wheezing present.   Abdominal:      General: There is no distension.      Palpations: Abdomen is soft.      Tenderness: There is abdominal tenderness. There is no guarding.   Musculoskeletal:      Cervical back: No rigidity.   Skin:     General: Skin is warm and dry.   Neurological:      Mental Status: She is alert and oriented to person, place, and time.      Cranial Nerves: No cranial nerve deficit, dysarthria or facial asymmetry.   Psychiatric:         Mood and Affect: Mood normal.         Speech: Speech normal.         ED Course     ED Course as of 03/07/23 1450   Tue Mar 07, 2023   1326 Consulted neuro, who agreed with plan for brain MRI w/w/o. No acute indication for further imaging at this time since no focal deficits on exam. Will also check CBC, CMP, trop, ECG.   1340 Thrombocytopenia to 75, otherwise unremarkable CBC   1419 Trop  122, ECG w/o acute abnormalities   1447 Repeat ECG stable, no ST elevations, NSR, low voltage QRS   1449 Neuro evaluated, nothing to add, will proceed with brain MRI. Recommended neuro follow up outpt as scheduled.         Medical Decision Making          Cecile Sheerer, MD  Resident  03/07/23 [phone]

## 2023-03-07 NOTE — Unmapped (Signed)
Hi,     Amy [name] has contacted the [employer] on behalf of Amy [name] in regards to the following symptom:     Blurry vision and confused this morning, daughter is worried she may have had another mini stroke    Caller reports that patient's treatment status is not currently in active treatment.    Please contact Amy [name] at [phone].    Symptoms deemed to be Urgent are marked High Priority    Thank you,  Rosary Lively   [facility]   [phone]

## 2023-03-07 NOTE — Unmapped (Signed)
March 07, 2023 10:42 AM [facility] removed.  Letter to be sent.  Medical condition deteriorated

## 2023-03-07 NOTE — Unmapped (Signed)
Patient send in by hem/ onc MD. Per daughter patient has had confusion, blurry vision since 0800 this morning. Patients daughter states she is not having symptoms as severe as before.

## 2023-03-07 NOTE — Unmapped (Signed)
Received sign out from previous provider.    Patient Summary: Amy [name] is a 62 y.o. female  PMH of Alport syndrome and nearing ESRD, T2DM, HTN, HLD, pancreatic adenocarcinoma with liver mets, possible R occipital mets vs embolic stroke. Pt presents with episodic blurry vision (began around 0800 this morning and has since self-resolved), as well as new onset confusion concerning for CVA/TIA vs brain mets given known history of pancreatic adenocarcinoma with unspecified R occipital lesion on brain MRI 9/27. Assessment was performed, neuro consulted. Stroke risk factors: diabetes mellitus, hypertension, hyperlipidemia, and hypercoagulable state.  Patient has been taking daily rosuvastatin & baby aspirin since ED visit on 02/10/23. 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 No ST changes, no chest pain c/f ACS.  Action List:   Pending MRI    Updates  ED Course as of 03/07/23 2350   Tue Mar 07, 2023   1520 Sign out received from prior provider.    1700 XR Chest 1 view Portable  IMPRESSION:     Negative chest.     1730 Discussed MRI results with neuro, pending further discussion with general neuro team and stroke team. Will get back with further recs.   1731 hsTroponin I (serial 2-6H CONTINUATION w/ delta)(!!):    hsTroponin I 118(!!)   delta hsTroponin I 4  Now downtrended, peaked at 122   1731 CBC w/ Differential(!):    WBC 8.2   RBC 4.22   HGB 12.5   HCT 37.3   MCV 88.4   MCH 29.6   MCHC 33.4   RDW 14.7   MPV 8.3   Platelet 75(!)   Neutrophils % 73.0   Lymphocytes % 17.2   Monocytes % 9.5   Eosinophils % 0.1   Basophils % 0.2   Absolute Neutrophils 6.0   Absolute Lymphocytes 1.4   Absolute Monocytes  0.8   Absolute Eosinophils 0.0   Absolute Basophils  0.0  Thrombocytopenia to 75, prior hx baseline 90s   1732 Comprehensive metabolic panel(!):    Sodium 136   Potassium 4.9(!)   Chloride 105   CO2 21.0   Anion Gap 10   Bun 69(!)   Creatinine 4.60(!)   BUN/Creatinine Ratio 15   eGFR CKD-EPI (2021) Female 10(!)   Glucose 243(!)   Calcium 9.0   Albumin 3.5   Total Protein 7.3   Total Bilirubin 0.7   SGOT (AST) 162(!)   ALT 237(!)   Alkaline Phosphatase 501(!)  Elevated liver enzymes, stable from prior  Cr near baseline  Mild hyperkalemia   1751 MRI Brain W Wo Contrast  IMPRESSION:  -Numerous punctate areas of restricted diffusion involving multiple vascular territories, suspicious for acute embolic type infarcts.     -Evolving right occipital infarct. Many of the previously seen punctate infarcts are less conspicuous.     -Foci of enhancement involving the left postcentral gyrus without definitive associated restricted diffusion. Etiology is indeterminate, but recommend follow-up MR in 6 to 8 weeks to exclude underlying metastatic lesion.     1755 Per neuro, pt ok for discharge. Plan to reassess and ambulate prior to discharge.   1918 On reassessment, patient was diaphoretic and somnolent.  Will repeat blood work including critical care VBG.  Will administer 0.2 mg of Narcan though pupils not pinpoint.   42 Spoke with neurology for decompensation and seizure like activity, recommended 4 g keppra and ativan 4 mg. Aware planning to intubate for airway  protection with GCS 6. Neuro to admit to ICU.     2012 Blood Gas Critical Care Panel, Venous(!!):    Specimen Source Venous   FIO2 Venous Room Air   pH, Venous 7.30(!)   pCO2, Ven 41   pO2, Ven 223(!)   HCO3, Ven 19(!)   Base Excess, Ven -5.9(!)   O2 Saturation, Venous 99.6(!)   Sodium Whole Blood 139   Potassium, Bld 6.2(!)   Ionized Calcium 4.67   Glucose Whole Blood 18(!!)   Lactate, Venous 0.9   Hemoglobin 12.40  Met acidosis with severe hypoglycemia, d50 ordered   2014 CBC w/ Differential(!):    WBC 20.0(!)   RBC 4.49   HGB 12.8   HCT 38.8   MCV 86.4   MCH 28.5   MCHC 33.0   RDW 14.6   MPV 8.2   Platelet 119(!)   Neutrophils % 70.8   Lymphocytes % 12.4   Monocytes % 16.3   Eosinophils % 0.2   Basophils % 0.3   Absolute Neutrophils 14.2(!)   Absolute Lymphocytes 2.5   Absolute Monocytes  3.3(!)   Absolute Eosinophils 0.0   Absolute Basophils  0.1  Leukocytosis with thrombocytopenia   2020 Patient became hypotensive with low maps for which Levophed was started and 1 L of LR administered via pressure bag.  Bedside POCUS with mild pericardial effusion. Repeat BG check was hyperglycemia, so held d50.    2230 Unclear if erratic BG readings are 2/2 pancreatic cancer. No obvious insulin pump noted. Continued to closely monitor glucose while waiting for ICU bed. May need glucose infusion if continues with recurrent hypoglycemia. Notified Neuro ICU resident of recurrent hypoglycemia and now hypothermic c/f sepsis. Bcx obtained in the ED prior to transfer to ICU.

## 2023-03-07 NOTE — Unmapped (Signed)
Called and spoke with pt to let her know we have scheduled her for her first infusion in [facility] on 10/31 at 7:30 am. Pt verbally confirmed appt date and time

## 2023-03-08 LAB — RENAL FUNCTION PANEL
ALBUMIN: 2.6 g/dL — ABNORMAL LOW (ref 3.4–5.0)
ALBUMIN: 3 g/dL — ABNORMAL LOW (ref 3.4–5.0)
ANION GAP: 3 mmol/L — ABNORMAL LOW (ref 5–14)
ANION GAP: 7 mmol/L (ref 5–14)
BLOOD UREA NITROGEN: 64 mg/dL — ABNORMAL HIGH (ref 9–23)
BLOOD UREA NITROGEN: 60 mg/dL — ABNORMAL HIGH (ref 9–23)
BUN / CREAT RATIO: 16
BUN / CREAT RATIO: 15
CALCIUM: 8.4 mg/dL — ABNORMAL LOW (ref 8.7–10.4)
CALCIUM: 7.7 mg/dL — ABNORMAL LOW (ref 8.7–10.4)
CHLORIDE: 114 mmol/L — ABNORMAL HIGH (ref 98–107)
CHLORIDE: 115 mmol/L — ABNORMAL HIGH (ref 98–107)
CO2: 20 mmol/L (ref 20.0–31.0)
CO2: 18 mmol/L — ABNORMAL LOW (ref 20.0–31.0)
CREATININE: 4 mg/dL — ABNORMAL HIGH
CREATININE: 3.98 mg/dL — ABNORMAL HIGH
EGFR CKD-EPI (2021) FEMALE: 12 mL/min/{1.73_m2} — ABNORMAL LOW (ref >=60)
EGFR CKD-EPI (2021) FEMALE: 12 mL/min/{1.73_m2} — ABNORMAL LOW (ref >=60)
GLUCOSE RANDOM: 284 mg/dL — ABNORMAL HIGH (ref 70–179)
GLUCOSE RANDOM: 118 mg/dL (ref 70–179)
PHOSPHORUS: 6.2 mg/dL — ABNORMAL HIGH (ref 2.4–5.1)
PHOSPHORUS: 5.7 mg/dL — ABNORMAL HIGH (ref 2.4–5.1)
POTASSIUM: 5 mmol/L — ABNORMAL HIGH (ref 3.4–4.8)
POTASSIUM: 6.2 mmol/L (ref 3.4–4.8)
SODIUM: 137 mmol/L (ref 135–145)
SODIUM: 140 mmol/L (ref 135–145)

## 2023-03-08 LAB — APTT
APTT: 25.2 s (ref 24.8–38.4)
HEPARIN CORRELATION: <0.2

## 2023-03-08 LAB — MAGNESIUM: MAGNESIUM: 2.2 mg/dL (ref 1.6–2.6)

## 2023-03-08 LAB — HEPATIC FUNCTION PANEL
ALBUMIN: 2.6 g/dL — ABNORMAL LOW (ref 3.4–5.0)
ALKALINE PHOSPHATASE: 396 U/L — ABNORMAL HIGH (ref 46–116)
ALT (SGPT): 172 U/L — ABNORMAL HIGH (ref 10–49)
AST (SGOT): 87 U/L — ABNORMAL HIGH (ref <=34)
BILIRUBIN DIRECT: 0.3 mg/dL (ref 0.00–0.30)
BILIRUBIN TOTAL: 0.6 mg/dL (ref 0.3–1.2)
PROTEIN TOTAL: 5.9 g/dL (ref 5.7–8.2)

## 2023-03-08 LAB — PROTIME-INR
INR: 0.98
PROTIME: 11.2 s (ref 9.9–12.6)

## 2023-03-08 LAB — POTASSIUM: POTASSIUM: 4.9 mmol/L — ABNORMAL HIGH (ref 3.4–4.8)

## 2023-03-08 LAB — HIGH SENSITIVITY TROPONIN I - SINGLE: HIGH SENSITIVITY TROPONIN I: 114 ng/L (ref <=34)

## 2023-03-08 LAB — D-DIMER, QUANTITATIVE: D-DIMER QUANTITATIVE (CW,ML,HL,HS,CH,JS,JC,RX,RH): 12850 ng{FEU}/mL — ABNORMAL HIGH (ref <=500)

## 2023-03-08 LAB — FIBRINOGEN: FIBRINOGEN LEVEL: 321 mg/dL (ref 175–500)

## 2023-03-08 MED ADMIN — atorvastatin (LIPITOR) tablet 40 mg: 40 mg | ORAL | @ 20:00:00

## 2023-03-08 MED ADMIN — calcium gluconate in sodium chloride (NS) 0.9% 2 gram/100 mL IVPB 2 g: 2 g | INTRAVENOUS | @ 01:00:00 | Stop: 2023-03-07

## 2023-03-08 MED ADMIN — iohexol (OMNIPAQUE) 350 mg iodine/mL solution 75 mL: 75 mL | INTRAVENOUS | @ 01:00:00 | Stop: 2023-03-07

## 2023-03-08 MED ADMIN — senna (SENOKOT) tablet 2 tablet: 2 | GASTROENTERAL | @ 06:00:00 | Stop: 2023-03-21

## 2023-03-08 MED ADMIN — polyethylene glycol (MIRALAX) packet 17 g: 17 g | GASTROENTERAL | @ 13:00:00

## 2023-03-08 MED ADMIN — dextrose (D10W) 10% bolus 125 mL: 12.5 g | INTRAVENOUS | @ 16:00:00 | Stop: 2024-03-07

## 2023-03-08 MED ADMIN — cefepime (MAXIPIME) 1 g in sodium chloride 0.9 % (NS) 100 mL IVPB-MBP: 1 g | INTRAVENOUS | @ 19:00:00 | Stop: 2023-03-08

## 2023-03-08 MED ADMIN — fentaNYL PF (SUBLIMAZE) (50 mcg/mL) infusion (bag): 0-200 ug/h | INTRAVENOUS | @ 05:00:00 | Stop: 2023-03-08

## 2023-03-08 MED ADMIN — NORepinephrine 8 mg in dextrose 5 % 250 mL (32 mcg/mL) infusion PMB: 0-30 ug/min | INTRAVENOUS

## 2023-03-08 MED ADMIN — dextrose 50 % in water (D50W) 50 % solution 50 mL: 50 mL | INTRAVENOUS | @ 03:00:00 | Stop: 2023-03-07

## 2023-03-08 MED ADMIN — heparin (porcine) 5,000 unit/mL injection 5,000 Units: 5000 [IU] | SUBCUTANEOUS | @ 19:00:00

## 2023-03-08 MED ADMIN — dextrose 50 % in water (D50W) 50 % solution 50 mL: 50 mL | INTRAVENOUS | Stop: 2023-03-07

## 2023-03-08 MED ADMIN — propofol (DIPRIVAN) infusion 10 mg/mL: 0-50 ug/kg/min | INTRAVENOUS | Stop: 2023-03-07

## 2023-03-08 MED ADMIN — levETIRAcetam (KEPPRA) injection 500 mg: 500 mg | INTRAVENOUS | @ 15:00:00 | Stop: 2023-03-08

## 2023-03-08 MED ADMIN — sodium chloride 0.9% (NS) bolus 1,000 mL: 1000 mL | INTRAVENOUS | Stop: 2023-03-07

## 2023-03-08 MED ADMIN — cefepime (MAXIPIME) 1 g in sodium chloride 0.9 % (NS) 100 mL IVPB-MBP: 1 g | INTRAVENOUS | @ 08:00:00 | Stop: 2023-03-08

## 2023-03-08 MED ADMIN — fentaNYL (PF) (SUBLIMAZE) injection 100 mcg: 100 ug | INTRAVENOUS | @ 05:00:00 | Stop: 2023-03-08

## 2023-03-08 MED ADMIN — sodium zirconium cyclosilicate (LOKELMA) packet 10 g: 10 g | ORAL | @ 12:00:00 | Stop: 2023-03-08

## 2023-03-08 MED ADMIN — fentaNYL PF (SUBLIMAZE) (50 mcg/mL) infusion (bag): 0-200 ug/h | INTRAVENOUS | @ 07:00:00 | Stop: 2023-03-08

## 2023-03-08 MED ADMIN — fentaNYL (PF) (SUBLIMAZE) injection 100 mcg: 100 ug | INTRAVENOUS | @ 07:00:00 | Stop: 2023-03-08

## 2023-03-08 MED ADMIN — albuterol 2.5 mg /3 mL (0.083 %) nebulizer solution 2.5 mg: 2.5 mg | RESPIRATORY_TRACT | @ 12:00:00 | Stop: 2023-03-08

## 2023-03-08 MED ADMIN — famotidine (PF) (PEPCID) injection 20 mg: 20 mg | INTRAVENOUS | @ 13:00:00 | Stop: 2023-03-08

## 2023-03-08 MED ADMIN — midazolam (PF) (VERSED) 1 mg/mL injection 10 mg: 10 mg | INTRAVENOUS | Stop: 2023-03-07

## 2023-03-08 MED ADMIN — succinylcholine (ANECTINE) injection 100 mg: 100 mg | INTRAVENOUS | Stop: 2023-03-07

## 2023-03-08 MED ADMIN — nicotine (NICODERM CQ) 14 mg/24 hr patch 1 patch: 1 | TRANSDERMAL | @ 13:00:00 | Stop: 2023-03-15

## 2023-03-08 MED ADMIN — dexAMETHasone (DECADRON) 4 mg/mL injection 10 mg: 10 mg | INTRAVENOUS | Stop: 2023-03-07

## 2023-03-08 MED ADMIN — fentaNYL (PF) (SUBLIMAZE) 50 mcg/mL injection: INTRAVENOUS | @ 07:00:00 | Stop: 2023-03-08

## 2023-03-08 NOTE — Unmapped (Signed)
NMA (Neurology Team A)  Admission   History and Physical     Patient: Amy [name]  Code Status: Full Code.  Level of Care: ICU status.   LOS: 0 days      Assessment and Plan   Contact Information:  Family contact: daughter  PCP: [name], MD    Assessment: Amy [name] is a 63 y.o. female with a past medical history of CKD5, metastatic pancreatic adenocarcinoma (liver mets),  who was admitted to [facility] on 03/07/2023 for recurrent embolic strokes and altered mental status.       # 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  Shortly thereafter had worsening altered mental status and therefore repeat CTA head was performed with no LVO.  Acute worsening likely due to seizures as described below.  Etiology for her recurrent multifocal strokes is most likely due to hypercoagulable state from her metastatic pancreatic cancer.  She also has tobacco use, diabetes, and hypertension which could be playing a role as well.      - Non-contrasted CT scan of the head showed no acute abnormality  - A1c 7.5, LDL 69  - echo 10/2 normal. Repeat POCUS here showed trace pericardial effusion  - carotid doppler 02/10/23 showed left IC stenosis <50%  - tele shows sinus bradycardia    Plan:  - Hospitalize to 6nsh for neuro/vitals checks  - Resume home aspirin 81mg  daily  - resume home rosuvastatin 10mg   - Provide stroke education for patient and family.  - Tobacco abuse: yes; will need smoking cessation counseling   - Appropriate for clinical trial screening? No.    #AMS:   #Concern for provoked seizures  Patient was waiting in the emergency department prior to discharge, when she began to have altered mental status.  Last known normal 4 PM, GCS 15 prior to this, but on reevaluation at 8 PM GCS was 7, patient was nonverbal, not opening eyes, moving right side briskly to noxious and left side weakly to noxious.  NIHSS 16 at this time.  Repeat CTA head negative for LVO.  She was given 4 mg Ativan and 4.5 mg Keppra load.  She had multiple erratic glucoses while here, initially normal, then low at 18, immediate recheck 357.  Etiology of altered mental status most likely having new seizures, possibly provoked in setting of erratic blood glucose changes and hypoglycemia, however also could be in relation to strokes and or possible metastatic disease to the brain that is currently undetermined.    - ventilator wean per icu  - glucose checks per below   - s/p keppra 4.5g and ativan 4mg   - video EEG  - propofol for sedation    # diabetes:  # Hypoglycemia  Patient had erratic glucoses and at one time received 1 amp of dextrose while in ED and hypoglycemia possibly responsible for AMS/seizures. Unclear etiology as she did not receive any antihyperglycemics in ED, possibly related to pancreatic cancer.   -  holding home jardiance and insulin SSI.  - D5 gtt  - serial glucose checks    #Metastatic pancreatic cancer with liver mets  First discovered 01/2023 with presentation for GI symptoms. Biopsy confirmed last week. It is unclear if there is brain involvement at this time. 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 Not on dialysis at this time. on home  bicarb daily  - CTM    #Transaminitis: likely iso metastatic liver disease  - ctm      # HTN:  - home lisinopril     # Pain:  - holding home lyrica    # HLD:  - home rosuvastatin    # Psoriasis: stelara q3 mo      Discharge Planning:   - Case management: pending.  - Social work: pending.  - PT: consulted, TBD  - OT: consulted, TBD  - SLP: consulted, TBD  - Expected Discharge Disposition: TBD.     Checklist:  - Diet: Strict NPO until patient passes bedside dysphagia screen or evaluation by speech-language pathology  - IV fluids: no  - Bowel Regimen: Miralax 17 g daily  - GI PPX: H2 blocker  - DVT PPX: Heparin 5000 units [address] q8h  - Lines/Access: PIV x2.    - Foley: Yes. Insertion date 10/22. Reason for insertion critical illness.    This patient was discussed with the NSICU attending, Dr. Cranston Neighbor. Dr. Regino Schultze was available.     This patient is co-managed with the NSICU Service. Under this co-management agreement, the NSICU serves as the first-call team. Please page the NSICU team at [phone] for any questions/concerns.  Neurology Team A resident can be reached at [phone].    Wayland Salinas, MD  PGY-4  Department of Neurology        Stroke Specific HPI     Primary neurologist: n/a    Chief Complaint: blurred vision    HPI:  Arrival Time: 1224 03/07/23   Date/Time of last known normal (provider): initially last week. Then was normal but worsened with lkn 4pm  Time of initial assessment: upon patient arrival and again at 8pm  Assessment performed in person at bedside.  Code stroke/BAT code/code IA called?: yes  Time code stroke cancelled (if applicable): n/a  Time non-contrast CT head was personally reviewed: at time of acquisition  Initial NIHSS total score: 16  ABCD2 score (if NIHSS < 4): na   Modified Rankin Scale (mRS) pre-stroke: 0 = No symptoms at all; no limitations and no symptoms  Eligible for alteplase?: NO: Contraindications - Other (see note for details) recent stroke  Time of treatment decision made at: 3pm  Alteplase bolus initiated at: N/A   Reason for treatment delay? N/A  Was workup for large vessel occlusion indicated? Yes:  Time of CTA personally reviewed at time of acquisition  Was patient referred for endovascular treatment? NO - No proximal occlusion  Eligible for stroke trial?: no  Stroke risk factors: diabetes mellitus, hypertension, hyperlipidemia, and hypercoagulable state.          Patient presents to ED with vague neurologic symptoms including resolved blurry vision and feeling out of focus. She reports that she woke up this morning with blurry vision in both eyes that resolved after putting her glasses back on and feeling off. She was recently diagnosed with metastatic pancreatic adenocarcinoma and was found to have R occipital diffusion restricting lesion concerning for stroke vs. Metastasis in September 2024. She reports feeling similar to that presentation which prompted this visit to the ED. In ED found to have puncate multifocal strokes. Was waiting to be discharged when she became increasingly altered after her MRI and 5mg  oxycodone. LKN 4pm. Was then nonresponsive, no response to narcan. Required intubation shortly after 4mg  ativan. Also received 4.5g keppra. Glucose low at 18, recheck was 347 at time of D5. Repeat stat CTA ordered, negative for LVO.  Per daughter, she was awake and talking/walking at at least 4pm prior to MRI. No hx seizures.     Past Medical History:  Past Medical History:   Diagnosis Date    Alport syndrome     Back pain     Chronic kidney disease     Dental abscess     Diabetes mellitus (CMS-HCC)     type 2, don't remember A1C, controlled with insulin injections; okay per physician at last visit    Hyperlipidemia     Hypertension     well controlled with meds    Kidney stone     no problems since 1995    Psoriasis        Past Surgical History:  Past Surgical History:   Procedure Laterality Date    BREAST BIOPSY Right 05/30/2018    Intraductal papilloma w/ florid DH & apocrine metaplasia    CATARACT EXTRACTION Right 08/29/2017    COLONOSCOPY      cyst removal in breast      HYSTERECTOMY      NEPHROSTOMY      OOPHORECTOMY      ONLY ONE OVARY REMOVED    PR COLSC FLX W/RMVL OF TUMOR POLYP LESION SNARE TQ N/A 06/19/2018    Procedure: COLONOSCOPY FLEX; W/REMOV TUMOR/LES BY SNARE;  Surgeon: Jarvis Morgan, MD;  Location: [facility];  Service: Gastroenterology    PR XCAPSL CTRC RMVL INSJ IO LENS PROSTH W/O ECP Right 08/29/2017    Procedure: EXTRACAPSULAR CATARACT REMOVAL W/INSERTION OF INTRAOCULAR LENS PROSTHESIS, MANUAL OR MECHANICAL TECHNIQUE Lens Choice:OD ZCB00 +19.50 [id number] +19.00/+18.00;  Surgeon: O'Rese Bonney Roussel, MD;  Location: [facility];  Service: Ophthalmology    PR XCAPSL CTRC RMVL INSJ IO LENS PROSTH W/O ECP Left 09/19/2017    Procedure: EXTRACAPSULAR CATARACT REMOVAL W/INSERTION OF INTRAOCULAR LENS PROSTHESIS, MANUAL OR MECHANICAL TECHNIQUE Lens Choice: ZCB00 +21.00; ZA9003 +20.00/+19.00;  Surgeon: O'Rese Bonney Roussel, MD;  Location: [facility] OR [facility];  Service: Ophthalmology         Social History:  Social History     Socioeconomic History    Marital status: Single   Tobacco Use    Smoking status: Former     Current packs/day: 0.00     Average packs/day: 1 pack/day for 43.0 years (43.0 ttl pk-yrs)     Types: Cigarettes     Start date: 02/12/1978     Quit date: 02/12/2021     Years since quitting: 2.0    Smokeless tobacco: Never   Vaping Use    Vaping status: Every Day   Substance and Sexual Activity    Alcohol use: No    Drug use: No    Sexual activity: Not Currently   Other Topics Concern    Do you use sunscreen? No    Tanning bed use? Yes     Comment: USED IN PAST    Are you easily burned? No    Excessive sun exposure? No    Blistering sunburns? Yes     Social Determinants of Health     Financial Resource Strain: Low Risk  (03/03/2023)    Overall Financial Resource Strain (CARDIA)     Difficulty of Paying Living Expenses: Not hard at all   Food Insecurity: No Food Insecurity (03/03/2023)    Hunger Vital Sign     Worried About Running Out of Food in the Last Year: Never true     Ran Out of Food in the  Last Year: Never true   Transportation Needs: No Transportation Needs (03/03/2023)    PRAPARE - Therapist, art (Medical): No     Lack of Transportation (Non-Medical): No   Physical Activity: Unknown (08/29/2017)    Exercise Vital Sign     Days of Exercise per Week: Patient declined     Minutes of Exercise per Session: Patient declined   Social Connections: Unknown (08/29/2017)    Social Connection and Isolation Panel [NHANES]     Frequency of Communication with Friends and Family: Patient declined     Frequency of Social Gatherings with Friends and Family: Patient declined     Attends Religious Services: Patient declined     Database administrator or Organizations: Patient declined     Attends Engineer, structural: Patient declined     Marital Status: Patient declined       Family History:  Family History   Problem Relation Age of Onset    Kidney disease Mother     Diabetes Mother     Cataracts Mother     Heart attack Father     No Known Problems Daughter     Cancer Paternal Grandmother     No Known Problems Other     Breast cancer Neg Hx     Colon cancer Neg Hx     Endometrial cancer Neg Hx     Ovarian cancer Neg Hx     Melanoma Neg Hx     Basal cell carcinoma Neg Hx     Squamous cell carcinoma Neg Hx     BRCA 1/2 Neg Hx        Home Medications:   (Not in a hospital admission)      Medications Administered at Hospital:  Current Facility-Administered Medications   Medication Dose Route Frequency Provider Last Rate Last Admin    calcium gluconate in sodium chloride (NS) 0.9% 2 gram/100 mL IVPB 2 g  2 g Intravenous Once Fredrich Romans, Burley Saver, MD 100 mL/hr at 03/07/23 2033 2 g at 03/07/23 2033    dextrose 50 % in water (D50W) 50 % solution 50 mL  50 mL Intravenous Q10 Min PRN Fredrich Romans, Burley Saver, MD   50 mL at 03/07/23 2027    EPINEPHrine 100 mcg/10 mL (10 mcg/mL) injection             lactated ringers bolus 1,000 mL  1,000 mL Intravenous Once Fredrich Romans, Burley Saver, MD        midazolam (PF) (VERSED) 5 mg/mL injection             naloxone (NARCAN) injection 0.2 mg  0.2 mg Intravenous Once Fredrich Romans, Burley Saver, MD        NORepinephrine 8 mg in dextrose 5 % 250 mL (32 mcg/mL) infusion PMB  0-30 mcg/min Intravenous Continuous Fredrich Romans, Burley Saver, MD 14.1 mL/hr at 03/07/23 2032 7.5 mcg/min at 03/07/23 2032    propofol (DIPRIVAN) infusion 10 mg/mL  0-50 mcg/kg/min Intravenous Continuous [name], MD 12.2 mL/hr at 03/07/23 2025 30 mcg/kg/min at 03/07/23 2025     Current Outpatient Medications   Medication Sig Dispense Refill    aspirin (ECOTRIN) 81 MG tablet Take 1 tablet (81 mg total) by mouth daily. 30 tablet 0    blood sugar diagnostic (TRUE METRIX GLUCOSE TEST STRIP) Strp True Metrix Glucose Test Strip      cholecalciferol, vitamin D3-25 mcg, 1,000 unit,, 25 mcg (1,000 unit) capsule Take 1 capsule (  25 mcg total) by mouth daily.      clobetasol (TEMOVATE) 0.05 % external solution Apply topically two (2) times a day. To scalp 50 mL 3    clobetasol (TEMOVATE) 0.05 % ointment Apply to your thick psoriasis areas twice daily until clear 60 g 5    cyclobenzaprine (FLEXERIL) 10 MG tablet Take 1 tablet (10 mg total) by mouth nightly.      ferric citrate (AURYXIA) 210 mg iron Tab tablet Take 1 tablet (210 mg total) by mouth Three (3) times a day with a meal. (Patient not taking: Reported on 02/01/2023) 90 tablet 11    lisinopril (PRINIVIL,ZESTRIL) 10 MG tablet Take 1 tablet (10 mg total) by mouth daily.      nicotine (NICODERM CQ) 14 mg/24 hr patch Place 1 patch on the skin daily. Remove old patch before applying new patch on skin. After 8 weeks, switch to 7 mg patches. 56 patch 0    nicotine (NICODERM CQ) 21 mg/24 hr patch Place 1 patch on the skin daily. Remove old patch before applying new patch on skin. After 8 weeks, switch to 14 mg patches. 56 patch 0    nicotine (NICODERM CQ) 7 mg/24 hr patch Place 1 patch on the skin daily for 28 days. 28 patch 0    NOVOLOG FLEXPEN U-100 INSULIN 100 unit/mL (3 mL) injection pen       oxyCODONE (ROXICODONE) 5 MG immediate release tablet Take 1 tablet (5 mg total) by mouth every four (4) hours as needed for pain. 10 tablet 0    pen needle, diabetic 32 gauge x 5/32 (4 mm) Ndle Inject as directed Three (3) times a day before meals.      pregabalin (LYRICA) 25 MG capsule       rosuvastatin (CRESTOR) 10 MG tablet Take by mouth.      SEMGLEE,INSULIN GLARG-YFGN,PEN 100 unit/mL (3 mL) InPn SYMBICORT 160-4.5 mcg/actuation inhaler       TRUEPLUS PEN NEEDLE 32 gauge x 5/32 Ndle   10    ustekinumab (STELARA) 45 mg/0.5 mL Syrg syringe Inject the contents of 1 syringe (45 mg) under the skin once every 12 weeks. 0.5 mL 4       Allergies:  Allergies   Allergen Reactions    Sulfur-8 Nausea Only    Glucophage [Metformin]     Codeine Itching    Morphine Itching          Review of Systems   Review of systems was unobtainable due to patient factors.     Physical Exam   General Exam:  General Appearance: Acutely ill  HEENT: n/a  Lungs: ventilated  Heart: NSR per monitor  Abdomen: Nondistended  Extremities: No edema    Neurological Exam:   Mental Status:   Nonverbal. Grunts to noxious    Cranial Nerves:   Pupils 4mm, round, reactive bilaterally., Extraocular movements are intact , Corneal present, Cough present, Gag present, and Facial droop at rest on Left    Motor Exam:   Tone is normal in all extremities  RUE: Strong withdrawal to noxious  LUE: Weak withdrawal to noxious  RLE: Strong withdrawal to noxious  LLE: Weak withdrawal to noxious    Sensory exam: Not assessed        NIHSS on arrival:  (1a.) Level of Consciousness:  3 = Responds only with reflex motor or automatic effects or totally unresponsive, flaccid, areflexic   (1b.) LOC Questions:  2 = Answers neither question correctly   (1c.)  LOC Commands:  2 = Performs neither task correctly   (2.)   Best Gaze:  0 = Normal   (3.)   Visual:  1 = Partial hemianopia   (4.)   Facial Palsy:  1 = Minor paralysis (flattened nasolabial fold, asymmetric on smiling)   (5a.) Motor Arm, Left:  2 = Some effort against gravity, limb cannot get to or maintain (if cured) 90 (or 45) degrees, drifts down to bed, but has some effort against gravity   (5b.) Motor Arm, Right:  0 = No drift, limb holds 90 (or 45) degrees for full 10 seconds   (6a.) Motor Leg, Left:  2 = Some effort against gravity, limb cannot get to or maintain (if cured) 90 (or 45) degrees, drifts down to bed, but has some effort against gravity   (6b.) Motor Leg, Right:  0 = No drift, limb holds 90 (or 45) degrees for full 5 seconds   (7.)   Limb Ataxia:  0 = Absent   (8.)   Sensory:  0 = Normal; no sensory loss   (9.)   Best Language:  3 = Mute, global aphasia; no usable speech or auditory comprehension   (10.) Dysarthria:  0 = Normal   (11.) Extinction and Inattention:  0 = No abnormality   NIHSS Total Score:  16          Data Review     Temp:  [36.8 ??C (98.3 ??F)] 36.8 ??C (98.3 ??F)  Heart Rate:  [53-87] 61  SpO2 Pulse:  [48-92] 61  Resp:  [11-23] 16  BP: (70-162)/(34-90) 104/52  MAP (mmHg):  [47-101] 67  FiO2 (%):  [100 %] 100 %  SpO2:  [92 %-100 %] 99 %  I/O this shift:  In: 2001.4 [I.V.:1.4; IV Piggyback:2000]  Out: -     All labs and imaging from last 24 hours reviewed.    Images personally reviewed and discussed in the Plan, above.

## 2023-03-08 NOTE — Unmapped (Signed)
Called and spoke with pt daughter Amy [name] to let her know we are following pt while she is in the hospital.  Also let her know we have made some appts for  her mother to begin chemo after her discharge.  We will keep all outpt appts as they are for now and if they need to be changed then we can move them around.  Pt daughter is on board with this plan.

## 2023-03-08 NOTE — Unmapped (Signed)
Speech Language Pathology Cognitive Linguistic Evaluation  Evaluation (03/07/60 1450)      Patient Name:  Amy [name]       Medical Record Number: [id number]   Date of Birth: [birth year]  Sex: Female            SLP Treatment Diagnosis: Cognitive linguistic deficits  r/o dysphagia  Activity Tolerance: Patient tolerated treatment well, Patient limited by fatigue    Assessment   Patient seen for clinical swallow evaluation and cognitive-linguistic evaluation. Patient's daughter present and supportive. The patient was awake and alert when SLP arrived, but lethargic and required moderate verbal stimulation throughout the session to remain awake and attend to evaluation tasks.     SWALLOWING: The patient with intact vocal quality; mildly dysarthric speech, not impacting intelligibility. Oral mech exam notable for edentulism. The patient denies hx of dysphagia. Patient reports that she wears dentures at home but does not have difficulty eating graham cracker without dentures. The patient was observed with consecutive straw sips thin liquids (water) and regular consistency solid without overt clinical s/sx of aspiration and no change in vocal quality. Patient with good oral acceptance and good oral clearance. Given clinical presentation, recommend thin liquid-0 and regular consistency solid diet with aspiration precautions, including only feed when awake and alert. Patient to self-select soft foods from menu. Medication taken as tolerated/preferred.       COGNITIVE LINGUISTIC: The patient presents with at least a moderate cognitive-linguistic impairment in the domains of orientation, attention, and memory. Further assessment needed in the domains of problem solving, judgment, and visual processing. The patient's language was informally assessed. Expressive language was fluent conversational speech; 3-4-word utterances; slow rate of speech. The patient's receptive language was notable for accurately followed simple 3 step directions in 1/3 trials, errors consisted of executing commands in the wrong order (likely attention impairment) and touching her shoulder when directed touch your elbow, with repeated error with repetition of prompt x3 (attention v. language impairment). Although the patient presented with formal attention scores within normal limits, the patient presents with at least a mild impairment in attention, as she required repetition of prompts and required moderate verbal stimulation to attend to evaluation tasks. Patient with moderate impairment in orientation, as she was not oriented to month, date, year, time, or day of the week. The patient with severe impairment in short-term memory, as she required maximal semantic cueing to recall 2/4 novel words 10 minutes after presentation. Patient will likely require assistance with IADLs and ST services on discharge. Will continue to follow to complete formal cognitive-linguistic evaluation.     Recommended Compensatory Techniques: Smart phone alarms and reminders, Stressing words, Tactile/visual cueing, Visual/Gestural cues, Repetition, Provide extra time    Prognosis: Good  Positive Indicators: social support        Plan of Care  SLP Daily Frequency: 1x per day, 2-3 days per week  Planned Treatment Duration : 04/12/23    Treatment Goals:  Short Term Goal 1: The patient will participate in ongoing cognitive-linguistic evaluation to inform Dispo planning    Date Established : 03/08/23 Time Frame : 4 weeks          Planned Interventions  Planned Interventions: Cognitive-Communication Treatment, Compensatory Strategy Training   Patient and Family Goal: none stated        Post Acute Discharge Recommendations  Post Acute SLP Discharge Recommendations: Skilled SLP services indicated, 5x weekly    Subjective         Medical Updates Since Last  Visit/Relevant PMH Affecting Clinical Decision Making: Amy [name] is a 62 y.o. RIGHT handed adult with ESRD, Alport syndrome (diagnosed via biopsy in 2009), T2DM, hypertension, hyperlipidemia newly diagnosed pancreatic adenocarcinoma with liver mets who presented to the emergency room on 03/07/2023 with concerns of confusion, episodic blurry vision since 8 AM.    Patient's daughter states patient woke up sleepy on 10/22 but this was not a concern as at times she does not sleeping well.  Patient went to work and 1 hour later patient called daughter crying stating she was confused and was having blurry vision.  By the time the daughter was able to get to the patient all symptoms had resolved.  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  There was also an evolving right occipital infarct.  In addition, left postcentral gyrus foci enhancement noted. MRI from 02/10/2023 showed a right occipital lesion concerning for metastasis.  Neurology was consulted and plan made for patient to be discharged and follow-up outpatient.        While waiting for discharge, at about 7 PM patient was noted to be somnolent and having incoherent speech.  Initial thought was due to opioid administration yet patient further declined to sonorous respirations, had repetitive right upper extremity movements and be came obtunded. Neurology was consulted to evaluate for acute stroke and concerns for seizure.  CTA head and neck showed prior strokes but no new processes.  No significant vascular stenosis or LVO.  Decision made to admit patient to the neuroscience ICU for cvEEG to rule out subclinical seizure.  Communication Preference: Verbal  Patient/Caregiver Reports: RN reports that the patient tolerated a thin liquid/regular consistency solid lunch without coughing  Pain: no s/s of pain       Allergies: Sulfur-8, Glucophage [metformin], Aspirin, Codeine, and Morphine  Current Facility-Administered Medications   Medication Dose Route Frequency Provider Last Rate Last Admin    acetaminophen (TYLENOL) tablet 650 mg  650 mg Enteral tube: gastric Q8H PRN Drema Dallas, Pieter Partridge, ACNP        atorvastatin (LIPITOR) tablet 40 mg  40 mg Oral Daily Darene Lamer [place], [place]        [START ON 03/09/2023] cosyntropin (CORTROSYN) injection 0.25 mg  0.25 mg Intravenous Once Darene Lamer [place], AGNP        dextrose (D10W) 10% bolus 125 mL  12.5 g Intravenous Q10 Min PRN Drema Dallas, [place], ACNP   125 mL at 03/08/23 1136    EPINEPHrine 100 mcg/10 mL (10 mcg/mL) injection             famotidine (PF) (PEPCID) injection 20 mg  20 mg Intravenous Daily Drema Dallas, [place], ACNP   20 mg at 03/08/23 0839    heparin (porcine) 5,000 unit/mL injection 5,000 Units  5,000 Units Subcutaneous [facility] Drema Dallas, [place], ACNP   5,000 Units at 03/08/23 1442    insulin regular (HumuLIN,NovoLIN) injection 0-20 Units  0-20 Units Subcutaneous Q6H [facility] Drema Dallas, [place], ACNP        levETIRAcetam (KEPPRA) injection 500 mg  500 mg Intravenous Q12H [facility] Bradly Chris, ACNP   500 mg at 03/08/23 1117    midazolam (PF) (VERSED) 5 mg/mL injection             nicotine (NICODERM CQ) 14 mg/24 hr patch 1 patch  1 patch Transdermal Daily Alphonsa Gin, ACNP   1 patch at 03/08/23 1610    Followed by    [  START ON 03/15/2023] nicotine (NICODERM CQ) 7 mg/24 hr patch 1 patch  1 patch Transdermal Daily Drema Dallas, [place], ACNP        ondansetron [facility]) injection 4 mg  4 mg Intravenous Q8H PRN Drema Dallas, [place], ACNP        polyethylene glycol (MIRALAX) packet 17 g  17 g Enteral tube: gastric Daily Drema Dallas, Pieter Partridge, ACNP   17 g at 03/08/23 1610    senna (SENOKOT) tablet 2 tablet  2 tablet Enteral tube: gastric Nightly Alphonsa Gin, ACNP   2 tablet at 03/08/23 0159    sodium zirconium cyclosilicate (LOKELMA) packet 10 g  10 g Oral BID Bradly Chris, ACNP          Patient Active Problem List    Diagnosis Date Noted    Adenocarcinoma of pancreas (CMS-HCC) 03/07/2023    Stage 4 chronic kidney disease (CMS-HCC) 08/21/2017    Type 2 diabetes mellitus with stage 4 chronic kidney disease, with long-term current use of insulin (CMS-HCC) 08/21/2017    Essential hypertension 08/21/2017    Hyperlipidemia, unspecified hyperlipidemia type 08/21/2017    Tobacco use 08/21/2017    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 08/03/2017    Chronic kidney disease, stage 3 (CMS-HCC) 04/13/2016     Past Medical History:   Diagnosis Date    Alport syndrome     Back pain     Chronic kidney disease     Dental abscess     Diabetes mellitus (CMS-HCC)     type 2, don't remember A1C, controlled with insulin injections; okay per physician at last visit    Hyperlipidemia     Hypertension     well controlled with meds    Kidney stone     no problems since 1995    Psoriasis        Past Surgical History:   Procedure Laterality Date    BREAST BIOPSY Right 05/30/2018    Intraductal papilloma w/ florid DH & apocrine metaplasia    CATARACT EXTRACTION Right 08/29/2017    COLONOSCOPY      cyst removal in breast      HYSTERECTOMY      INSERT ARTERIAL LINE  03/08/2023    NEPHROSTOMY      OOPHORECTOMY      ONLY ONE OVARY REMOVED    PR COLSC FLX W/RMVL OF TUMOR POLYP LESION SNARE TQ N/A 06/19/2018    Procedure: COLONOSCOPY FLEX; W/REMOV TUMOR/LES BY SNARE;  Surgeon: Jarvis Morgan, MD;  Location: [facility];  Service: Gastroenterology    PR XCAPSL CTRC RMVL INSJ IO LENS PROSTH W/O ECP Right 08/29/2017    Procedure: EXTRACAPSULAR CATARACT REMOVAL W/INSERTION OF INTRAOCULAR LENS PROSTHESIS, MANUAL OR MECHANICAL TECHNIQUE Lens Choice:OD ZCB00 +19.50 [id number] +19.00/+18.00;  Surgeon: O'Rese Bonney Roussel, MD;  Location: [facility];  Service: Ophthalmology    PR XCAPSL CTRC RMVL INSJ IO LENS PROSTH W/O ECP Left 09/19/2017    Procedure: EXTRACAPSULAR CATARACT REMOVAL W/INSERTION OF INTRAOCULAR LENS PROSTHESIS, MANUAL OR MECHANICAL TECHNIQUE Lens Choice: ZCB00 +21.00; ZA9003 +20.00/+19.00;  Surgeon: O'Rese Bonney Roussel, MD;  Location: [facility];  Service: Ophthalmology     Social History     Tobacco Use    Smoking status: Former     Current packs/day: 0.00     Average packs/day: 1 pack/day for 43.0 years (43.0 ttl pk-yrs)     Types: Cigarettes     Start date: 02/12/1978  Quit date: 02/12/2021     Years since quitting: 2.0    Smokeless tobacco: Never   Substance Use Topics    Alcohol use: No     Family History   Problem Relation Age of Onset    Kidney disease Mother     Diabetes Mother     Cataracts Mother     Heart attack Father     No Known Problems Daughter     Cancer Paternal Grandmother     No Known Problems Other     Breast cancer Neg Hx     Colon cancer Neg Hx     Endometrial cancer Neg Hx     Ovarian cancer Neg Hx     Melanoma Neg Hx     Basal cell carcinoma Neg Hx     Squamous cell carcinoma Neg Hx     BRCA 1/2 Neg Hx        General:   Medical Tests / Procedures Comments: Brain MRI (03/07/23): -Numerous punctate areas of restricted diffusion involving multiple vascular territories, suspicious for acute embolic type infarcts.    -Evolving right occipital infarct. Many of the previously seen punctate infarcts are less conspicuous.    -Foci of enhancement involving the left postcentral gyrus without definitive associated restricted diffusion. Etiology is indeterminate, but recommend follow-up MR in 6 to 8 weeks to exclude underlying metastatic lesion. CXR (03/07/23): Negative chest.  Equipment/Environment: Supplemental oxygen       Precautions / Restrictions  Precautions: Aspiration precautions    Objective  Swallow Status: Patient not on a diet at the time of evaluation, but RN reports that patient tolerated lunch with thin-liquids and regular consistency solids without coughing.  Motor Speech: Mild dysarthria   Oral Mechanism : Notable for edentulism   Orientation: COGNISTAT: moderate impairment (6/12)  Attention Comments: COGNISTAT: intact (6/8); Informal: patient required repetition of prompts and required moderate verbal stimulation to attend to evaluation tasks; Patient accurately followed simple 3 step directions in 1/3 trials  Memory Comments: COGNISTAT: severe impairment (2/12)  Problem Solving: Will be assessed further in therapy  Safety/Judgement: Will be assessed further in therapy  Verbal Expression Comments: Patient with fluent conversational speech; 3-4 word utterances; slow rate of speech  Comprehension Comments: Patient accurately followed simple 3 step directions in 1/3 trials     Written Expression: Will be assessed further in therapy  Reading Comprehension: Will be assessed further in therapy   Visual Processing: will be assessed further    Activity Tolerance: Patient tolerated treatment well, Patient limited by fatigue       Patient at end of session: All needs in reach, Nurse notified    Speech Therapy Session Duration   SLP Individual [mins]: 20      I attest that I have reviewed the above information.  Signed: Jacquenette Shone, SLP  Filed 03/08/2023

## 2023-03-08 NOTE — Unmapped (Signed)
Pt received in unit intubated and sedated. Localizes strongly in uppers withdraws in lowers. Pupils 3 and brisk. Strong cough, gag. Corneals. Vented on a rate. On EEG. Sbp <160 map >65. Pt on/levo through night. Runs 30s-40s HR. OG tube with vital hp ordered. Foley. Bair hugger applied for hypothermia, taken off later in shift. Art line, 3 PIV. Alarms active and audible. See flowsheets for full assessment.        Problem: Adult Inpatient Plan of Care  Goal: Plan of Care Review  Outcome: Ongoing - Unchanged  Goal: Patient-Specific Goal (Individualized)  Outcome: Ongoing - Unchanged  Goal: Absence of Hospital-Acquired Illness or Injury  Outcome: Ongoing - Unchanged  Intervention: Identify and Manage Fall Risk  Recent Flowsheet Documentation  Taken 03/08/2023 0400 by Tiney Rouge, RN  Safety Interventions: bed alarm  Taken 03/08/2023 0200 by Tiney Rouge, RN  Safety Interventions: bed alarm  Taken 03/08/2023 0000 by Tiney Rouge, RN  Safety Interventions: bed alarm  Taken 03/07/2023 2300 by Tiney Rouge, RN  Safety Interventions: bed alarm  Intervention: Prevent Skin Injury  Recent Flowsheet Documentation  Taken 03/08/2023 0400 by Tiney Rouge, RN  Positioning for Skin: Left  Device Skin Pressure Protection: absorbent pad utilized/changed  Skin Protection: adhesive use limited  Taken 03/08/2023 0200 by Tiney Rouge, RN  Positioning for Skin: Right  Device Skin Pressure Protection: absorbent pad utilized/changed  Skin Protection: adhesive use limited  Taken 03/08/2023 0000 by Tiney Rouge, RN  Positioning for Skin: Left  Device Skin Pressure Protection: absorbent pad utilized/changed  Skin Protection: adhesive use limited  Taken 03/07/2023 2300 by Tiney Rouge, RN  Positioning for Skin: Right  Device Skin Pressure Protection: absorbent pad utilized/changed  Skin Protection: adhesive use limited  Intervention: Prevent and Manage VTE (Venous Thromboembolism) Risk  Recent Flowsheet Documentation  Taken 03/08/2023 0400 by Tiney Rouge, RN  Anti-Embolism Device Type: SCD, Knee  Anti-Embolism Intervention: On  Anti-Embolism Device Location: LLE  Taken 03/08/2023 0200 by Tiney Rouge, RN  Anti-Embolism Device Type: SCD, Knee  Anti-Embolism Intervention: On  Anti-Embolism Device Location: LLE  Taken 03/08/2023 0000 by Tiney Rouge, RN  Anti-Embolism Device Type: SCD, Knee  Anti-Embolism Intervention: On  Anti-Embolism Device Location: LLE  Taken 03/07/2023 2300 by Tiney Rouge, RN  VTE Prevention/Management: bleeding precautions maintained  Anti-Embolism Device Type: SCD, Knee  Anti-Embolism Intervention: On  Anti-Embolism Device Location: BLE  Goal: Optimal Comfort and Wellbeing  Outcome: Ongoing - Unchanged  Goal: Readiness for Transition of Care  Outcome: Ongoing - Unchanged  Goal: Rounds/Family Conference  Outcome: Ongoing - Unchanged     Problem: Skin Injury Risk Increased  Goal: Skin Health and Integrity  Outcome: Ongoing - Unchanged  Intervention: Optimize Skin Protection  Recent Flowsheet Documentation  Taken 03/08/2023 0400 by Tiney Rouge, RN  Activity Management: bedrest  Pressure Reduction Techniques:   frequent weight shift encouraged   heels elevated off bed  Head of Bed (HOB) Positioning: HOB at 30-45 degrees  Pressure Reduction Devices: pressure-redistributing mattress utilized  Skin Protection: adhesive use limited  Taken 03/08/2023 0200 by Tiney Rouge, RN  Activity Management: bedrest  Pressure Reduction Techniques:   frequent weight shift encouraged   heels elevated off bed  Head of Bed (HOB) Positioning: HOB at 30-45 degrees  Pressure Reduction Devices: pressure-redistributing mattress utilized  Skin Protection: adhesive use limited  Taken 03/08/2023 0000 by Tiney Rouge, RN  Activity Management: bedrest  Pressure Reduction Techniques:   frequent weight shift encouraged   heels elevated off bed  Head  of Bed (HOB) Positioning: HOB at 30-45 degrees  Pressure Reduction Devices: pressure-redistributing mattress utilized  Skin Protection: adhesive use limited  Taken 03/07/2023 2300 by Tiney Rouge, RN  Activity Management: bedrest  Pressure Reduction Techniques:   frequent weight shift encouraged   heels elevated off bed  Head of Bed (HOB) Positioning: HOB at 30-45 degrees  Pressure Reduction Devices: pressure-redistributing mattress utilized  Skin Protection: adhesive use limited     Problem: Self-Care Deficit  Goal: Improved Ability to Complete Activities of Daily Living  Outcome: Ongoing - Unchanged     Problem: Fall Injury Risk  Goal: Absence of Fall and Fall-Related Injury  Outcome: Ongoing - Unchanged  Intervention: Promote Scientist, clinical (histocompatibility and immunogenetics) Documentation  Taken 03/08/2023 0400 by Tiney Rouge, RN  Safety Interventions: bed alarm  Taken 03/08/2023 0200 by Tiney Rouge, RN  Safety Interventions: bed alarm  Taken 03/08/2023 0000 by Tiney Rouge, RN  Safety Interventions: bed alarm  Taken 03/07/2023 2300 by Tiney Rouge, RN  Safety Interventions: bed alarm     Problem: Wound  Goal: Optimal Coping  Outcome: Ongoing - Unchanged  Goal: Optimal Functional Ability  Outcome: Ongoing - Unchanged  Intervention: Optimize Functional Ability  Recent Flowsheet Documentation  Taken 03/08/2023 0400 by Tiney Rouge, RN  Activity Management: bedrest  Taken 03/08/2023 0200 by Tiney Rouge, RN  Activity Management: bedrest  Taken 03/08/2023 0000 by Tiney Rouge, RN  Activity Management: bedrest  Taken 03/07/2023 2300 by Tiney Rouge, RN  Activity Management: bedrest  Goal: Absence of Infection Signs and Symptoms  Outcome: Ongoing - Unchanged  Goal: Improved Oral Intake  Outcome: Ongoing - Unchanged  Goal: Optimal Pain Control and Function  Outcome: Ongoing - Unchanged  Goal: Skin Health and Integrity  Outcome: Ongoing - Unchanged  Intervention: Optimize Skin Protection  Recent Flowsheet Documentation  Taken 03/08/2023 0400 by Tiney Rouge, RN  Activity Management: bedrest  Pressure Reduction Techniques:   frequent weight shift encouraged   heels elevated off bed  Head of Bed (HOB) Positioning: HOB at 30-45 degrees  Pressure Reduction Devices: pressure-redistributing mattress utilized  Skin Protection: adhesive use limited  Taken 03/08/2023 0200 by Tiney Rouge, RN  Activity Management: bedrest  Pressure Reduction Techniques:   frequent weight shift encouraged   heels elevated off bed  Head of Bed (HOB) Positioning: HOB at 30-45 degrees  Pressure Reduction Devices: pressure-redistributing mattress utilized  Skin Protection: adhesive use limited  Taken 03/08/2023 0000 by Tiney Rouge, RN  Activity Management: bedrest  Pressure Reduction Techniques:   frequent weight shift encouraged   heels elevated off bed  Head of Bed (HOB) Positioning: HOB at 30-45 degrees  Pressure Reduction Devices: pressure-redistributing mattress utilized  Skin Protection: adhesive use limited  Taken 03/07/2023 2300 by Tiney Rouge, RN  Activity Management: bedrest  Pressure Reduction Techniques:   frequent weight shift encouraged   heels elevated off bed  Head of Bed (HOB) Positioning: HOB at 30-45 degrees  Pressure Reduction Devices: pressure-redistributing mattress utilized  Skin Protection: adhesive use limited  Goal: Optimal Wound Healing  Outcome: Ongoing - Unchanged

## 2023-03-08 NOTE — Unmapped (Addendum)
DAILY INTERIM LONGTERM VIDEO EEG MONITORING NOTE     REPORT NOT YET FINAL - Please see final report under PROCEDURE tab in CHART REVIEW when study completed     Patient: Amy [name]  Date of Birth: 01/11/1961  Attending: Reece Packer,  M.D.  Ordering Provider: Joycelyn Das  [facility] No: [id number]      DATE OF STUDY: 03/08/2023     TIME OF INTERIM REPORT: 03/08/2023, 05:10     HISTORY:  62 y.o. right handed female with history of ESRD who presented to the emergency room on 03/07/2023 with concerns of confusion, episodic blurry vision.   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 There was also an evolving right occipital infarct.  While waiting for discharge patient was noted to be somnolent and having incoherent speech.  Patient further declined to sonorous respirations, had repetitive right upper extremity movements and became obtunded. BMP showed a glucose of 20, and a blood gas showed a glucose of 18.    cEEG to eval sz     PROCEDURE:  Continuous EEG with simultaneous video recording was performed utilizing 21 active electrodes placed according to the international 10-20 system.  The study was recorded digitally with a bandpass of 1-70Hz  and a sampling rate of 200Hz  and was reviewed with the possibility of multiple reformatting.  The study was digitally processed with potential spike and seizure events identified for physician analysis and review.  Patient recognized events were identified by a push button marker and reviewed by the physician.   Simultaneous video was reviewed for all patient events.     TECHNICAL DESCRIPTION:  Background activities are continuous and are comprised of mixed alpha, theta, and delta range frequencies during maximal alertness. Possible posterior dominant rhythm of 6 cps is seen. State changes/reactivity are present during the study recording. No interictal abnormalities are seen. No electrographic event suggestive of a seizure is seen.     IMPRESSION:  This EEG is abnormal secondary to moderate diffuse slowing.     CLINICAL INTERPRETATION:  Diffuse slowing is indicative of bihemispheric dysfunction which could be on the basis of a toxic, metabolic, or primary neuronal disorder.    2HELPS2B score is 0   [Note: Add 1 additional point to the score if prior seizure]       This real time interim long term video EEG monitoring note reflects my daily review of the EEG study with physician access to the EEG data throughout the recording.  The time period of this review is 03/08/2023 from 00:06 to 04:30 with the report updated daily to reflect the findings from the start of the study recording.

## 2023-03-08 NOTE — Unmapped (Signed)
Adult Nutrition Assessment Note    Visit Type: MD Consult  Reason for Visit: Enteral Nutrition    NUTRITION INTERVENTIONS and RECOMMENDATION     Advance diet as able per SLP recs.  If unable to advance diet, consider enteral nutrition support:  Recommend Nepro at goal rate 40 mL/hr.   This provides 1512 kcals, 69 g protein, 136 g carbohydrate, 81 g fat, 21 g fiber, 611 mL free water, and meets 89% USRDI.  Initiate at 20 mL/hr, advance by 20 mL/hr q4h.  FWF per MD team.   Weigh patient 2-3x weekly.       NUTRITION ASSESSMENT     Patient is currently unable to meet nutrient needs in setting of NPO order without enteral access.   Patient likely to benefit from SLP evaluation and diet advancement as appropriate.  If unable to advance diet, patient likely to benefit from enteral nutrition support to meet nutrient needs.    NUTRITIONALLY RELEVANT DATA     HPI & PMH:   Amy [name] is a 62 y.o. female with PMH ESRD, T2DM admitted to NSICU with a diagnosis of altered mental status.     Nutrition History:   Visited patient at bedside. Patient very drowsy. Daughter reports patient last ate yesterday. Patient has been eating well recently with good appetite. No weight loss per daughter. Per chart, weight stable for at least past 4 months. Patient avoids bananas in setting of ESRD.   MD reports plan for SLP evaluation as able in setting of drowsiness.    Medications:  Nutritionally pertinent medications reviewed and evaluated for potential food and/or medication interactions.  Norepinephrine paused;     Labs:   Nutritionally pertinent labs reviewed and include K+: 4.9 mmol/L and Phosphorus: 5.7 mg/dL    Nutritional Needs:   Daily Estimated Nutrient Needs:  Energy: 1300-1690 kcals Per [place]-Jeor Equation using admission body weight, 70 kg (03/08/23 1345)]  Protein: 84-105 gm [1.2-1.5 gm/kg using admission body weight, 70 kg (03/08/23 1345)]  Carbohydrate:   [no restriction]  Fluid:   mL [per MD team]     Malnutrition Assessment:  Malnutrition Assessment using AND/ASPEN or GLIM Clinical Characteristics:    Patient does not meet AND/ASPEN criteria for malnutrition at this time (03/08/23 1350)               Nutrition Focused Physical Exam:  Nutrition Focused Physical Exam:                        Nutrition Evaluation  Overall Impressions: Nutrition-Focused Physical Exam not indicated due to lack of malnutrition risk factors. (03/08/23 1350)     Care plan:  Not completed, patient does not meet malnutrition criteria     Current Nutrition:  NPO  Nutrition Orders            Vital High Protein (Critical Care Low Cal/HP) 20 mL continuous tube feed at 20 mL/hr starting at 10/23 0400            Nutritionally Pertinent Allergies, Intolerances, Sensitivities, and/or Cultural/Religious Restrictions:  none identified at this time     Anthropometric Data:  [name]: 169 cm (5' 6.54)   Admission weight: 68 kg (150 lb)  Last recorded weight: 70.4 kg (155 lb 3.3 oz)  Date of last recorded weight: 03/07/23  IBW: 60.22 kg  BMI: Body mass index is 24.65 kg/m??.   Usual Body Weight: Unable to obtain at this time   Per documented weights PTA,  weight relatively stable x4 months PTA from June to October 2024.     GOALS and EVALUATION     Patient to remain NPO and/or on a clear liquid diet less than 7 days before diet advancement. - New    Motivation, Barriers, and Compliance:  Evaluation of motivation, barriers, and compliance pending at this time due to clinical status.     Discharge Planning:   Monitor for potential discharge needs with multi-disciplinary team.          Follow-Up Parameters:   1-2 times per week (and more frequent as indicated)    Amalia Greenhouse, MPH, RD, LDN, CNSC  Pager: [phone]

## 2023-03-08 NOTE — Unmapped (Cosign Needed)
Neurocritical Care Admission Note     CODE STATUS:    Code Status: Full Code    HCDM (patient stated preference): Amy [name] - Daughter [phone]  I discussed and confirmed Code Status with patient or HCDM    Date of service: 03/07/2023  Hospital Day:  LOS: 0 days        HPI     Amy [name] is a 62 y.o. RIGHT handed adult with ESRD, Alport syndrome (diagnosed via biopsy in 2009), T2DM, hypertension, hyperlipidemia newly diagnosed pancreatic adenocarcinoma with liver mets who presented to the emergency room on 03/07/2023 with concerns of confusion, episodic blurry vision since 8 AM.    Patient's daughter states patient woke up sleepy on 10/22 but this was not a concern as at times she does not sleeping well.  Patient went to work and 1 hour later patient called daughter crying stating she was confused and was having blurry vision.  By the time the daughter was able to get to the patient all symptoms had resolved.  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  There was also an evolving right occipital infarct.  In addition, left postcentral gyrus foci enhancement noted.MRI from 02/10/2023 showed a right occipital lesion concerning for metastasis.  Neurology was consulted and plan made for patient to be discharged and follow-up outpatient.      While waiting for discharge, at about 7 PM patient was noted to be somnolent and having incoherent speech.  Initial thought was due to opioid administration yet patient further declined to sonorous respirations, had repetitive right upper extremity movements and be came obtunded.  BMP showed a glucose of 20, and a blood gas showed a glucose of 18.  Medical team concern for erroneous glucose levels.  Patient received 10 mg of IV Decadron for concerns of cerebral edema.  An amp of D50 was administered and glucose was checked and noted to be in the 300s.  Reportedly glucose check was taken before the D50 was administered.  Per chart review patient had already received the 10 mg of IV Decadron which will alter glucose levels.  Neurology was consulted to evaluate for acute stroke and concerns for seizure.  CTA head and neck showed prior strokes but no new processes.  No significant vascular stenosis or LVO.  Decision made to admit patient to the neuroscience ICU for cvEEG to rule out subclinical seizure.    History     PAST MEDICAL HISTORY  Past Medical History:   Diagnosis Date    Alport syndrome     Back pain     Chronic kidney disease     Dental abscess     Diabetes mellitus (CMS-HCC)     type 2, don't remember A1C, controlled with insulin injections; okay per physician at last visit    Hyperlipidemia     Hypertension     well controlled with meds    Kidney stone     no problems since 1995    Psoriasis        SURGICAL HISTORY  Past Surgical History:   Procedure Laterality Date    BREAST BIOPSY Right 05/30/2018    Intraductal papilloma w/ florid DH & apocrine metaplasia    CATARACT EXTRACTION Right 08/29/2017    COLONOSCOPY      cyst removal in breast      HYSTERECTOMY      NEPHROSTOMY      OOPHORECTOMY  ONLY ONE OVARY REMOVED    PR COLSC FLX W/RMVL OF TUMOR POLYP LESION SNARE TQ N/A 06/19/2018    Procedure: COLONOSCOPY FLEX; W/REMOV TUMOR/LES BY SNARE;  Surgeon: Jarvis Morgan, MD;  Location: [facility];  Service: Gastroenterology    PR XCAPSL CTRC RMVL INSJ IO LENS PROSTH W/O ECP Right 08/29/2017    Procedure: EXTRACAPSULAR CATARACT REMOVAL W/INSERTION OF INTRAOCULAR LENS PROSTHESIS, MANUAL OR MECHANICAL TECHNIQUE Lens Choice:OD ZCB00 +19.50 [id number] +19.00/+18.00;  Surgeon: O'Rese Bonney Roussel, MD;  Location: [facility];  Service: Ophthalmology    PR XCAPSL CTRC RMVL INSJ IO LENS PROSTH W/O ECP Left 09/19/2017    Procedure: EXTRACAPSULAR CATARACT REMOVAL W/INSERTION OF INTRAOCULAR LENS PROSTHESIS, MANUAL OR MECHANICAL TECHNIQUE Lens Choice: ZCB00 +21.00; ZA9003 +20.00/+19.00;  Surgeon: O'Rese Bonney Roussel, MD;  Location: [facility] OR [facility];  Service: Ophthalmology       HOME MEDICATIONS  No current facility-administered medications on file prior to encounter.     Current Outpatient Medications on File Prior to Encounter   Medication Sig    aspirin (ECOTRIN) 81 MG tablet Take 1 tablet (81 mg total) by mouth daily.    blood sugar diagnostic (TRUE METRIX GLUCOSE TEST STRIP) Strp True Metrix Glucose Test Strip    cholecalciferol, vitamin D3-25 mcg, 1,000 unit,, 25 mcg (1,000 unit) capsule Take 1 capsule (25 mcg total) by mouth daily.    clobetasol (TEMOVATE) 0.05 % external solution Apply topically two (2) times a day. To scalp    clobetasol (TEMOVATE) 0.05 % ointment Apply to your thick psoriasis areas twice daily until clear    cyclobenzaprine (FLEXERIL) 10 MG tablet Take 1 tablet (10 mg total) by mouth nightly.    ferric citrate (AURYXIA) 210 mg iron Tab tablet Take 1 tablet (210 mg total) by mouth Three (3) times a day with a meal. (Patient not taking: Reported on 02/01/2023)    lisinopril (PRINIVIL,ZESTRIL) 10 MG tablet Take 1 tablet (10 mg total) by mouth daily.    [EXPIRED] nicotine (NICODERM CQ) 14 mg/24 hr patch Place 1 patch on the skin daily for 14 days. Remove old patch before applying new patch on skin. After 2 weeks, switch to 7 mg patches.    nicotine (NICODERM CQ) 14 mg/24 hr patch Place 1 patch on the skin daily. Remove old patch before applying new patch on skin. After 8 weeks, switch to 7 mg patches.    nicotine (NICODERM CQ) 21 mg/24 hr patch Place 1 patch on the skin daily. Remove old patch before applying new patch on skin. After 8 weeks, switch to 14 mg patches.    [EXPIRED] nicotine (NICODERM CQ) 7 mg/24 hr patch Place 1 patch on the skin daily for 14 days. Remove old patch before applying new patch on skin.    nicotine (NICODERM CQ) 7 mg/24 hr patch Place 1 patch on the skin daily for 28 days.    NOVOLOG FLEXPEN U-100 INSULIN 100 unit/mL (3 mL) injection pen     oxyCODONE (ROXICODONE) 5 MG immediate release tablet Take 1 tablet (5 mg total) by mouth every four (4) hours as needed for pain.    pen needle, diabetic 32 gauge x 5/32 (4 mm) Ndle Inject as directed Three (3) times a day before meals.    pregabalin (LYRICA) 25 MG capsule     rosuvastatin (CRESTOR) 10 MG tablet Take by mouth.    SEMGLEE,INSULIN GLARG-YFGN,PEN 100 unit/mL (3 mL) InPn     [EXPIRED] sodium bicarbonate 650  mg tablet Take 2 tablets (1,300 mg total) by mouth Two (2) times a day.    SYMBICORT 160-4.5 mcg/actuation inhaler     TRUEPLUS PEN NEEDLE 32 gauge x 5/32 Ndle     ustekinumab (STELARA) 45 mg/0.5 mL Syrg syringe Inject the contents of 1 syringe (45 mg) under the skin once every 12 weeks.       Allergies  Sulfur-8, Glucophage [metformin], Codeine, and Morphine    Family History  Family History   Problem Relation Age of Onset    Kidney disease Mother     Diabetes Mother     Cataracts Mother     Heart attack Father     No Known Problems Daughter     Cancer Paternal Grandmother     No Known Problems Other     Breast cancer Neg Hx     Colon cancer Neg Hx     Endometrial cancer Neg Hx     Ovarian cancer Neg Hx     Melanoma Neg Hx     Basal cell carcinoma Neg Hx     Squamous cell carcinoma Neg Hx     BRCA 1/2 Neg Hx        Social History  Social History     Tobacco Use    Smoking status: Former     Current packs/day: 0.00     Average packs/day: 1 pack/day for 43.0 years (43.0 ttl pk-yrs)     Types: Cigarettes     Start date: 02/12/1978     Quit date: 02/12/2021     Years since quitting: 2.0    Smokeless tobacco: Never   Vaping Use    Vaping status: Every Day   Substance Use Topics    Alcohol use: No    Drug use: No       Review of Systems     Review of systems not obtained due to patient factors.  Patient unable to participate due to altered mental status.  I spoke with Amy [name], patient's daughter, who states patient has had a good week and has not been sick.  Morrie Sheldon states patient was in her typical state of health on Tuesday 10/22 morning.  There have not been any changes to medications.  There is no concern or knowledge of illicit substance use.       Assessment and Plan     Lyrick Fleeger is a 62 y.o. female admitted to NSICU with a diagnosis of altered mental status.     Family update: Spoke with Amy [name] (HCDM) via telephone. Updated on plan of care and day events. All questions and concerns addressed.      Neuro:  Mobility goal: 4 (Out of bed to chair assessment    **Encephalopathy, acute onset  -Etiology is unclear, strange presentation.  Patient had an acute change while waiting for discharge in the ED.  Suspect symptoms related to hypoglycemia.  2 separate checks showed a glucose of 20, 18 respectively.  Cannot rule out seizure, reportedly repetitive upper extremity movement and ?lip smacking.  Patient does have a left-sided cortical lesion.  -A toxic metabolic presentation is also possible given renal disease and hepatic metastasis.   -Infectious process less likely, although possible.  Per discussion with daughter it does not appear that patient was having any symptoms in the last days.  - place on cEEG monitoring to assess for subclinical seizures/status as a cause of altered mental status  - check LFTs and ammonia to assess for  hepatic encephalopathy   - check cardiac enzymes and ECG to assess for cardiac cause  - check all cultures and u/a to assess for infection  - check thyroid studies    **Seizure, c/f  Per discussion with daughter patient does not have any history of seizures, however has ever taken seizure medications.   -Semiology per chart review in the ED: Obtunded, bilateral upper extremity repetitive movements, lip smacking   -10 mg of Versed given, unclear if this improved movements as patient's GCS decreased to 6 and required intubation  -Placed on EEG  --- No seizures noted thus far    **Pain  - tylenol prn mild pain  - oxycodone prn moderate pain  - fentanyl prn severe pain      Cardiovascular:  Admission weight: 70.4Kg  Daily Weight: 68 kg (150 lb)     Hemodynamic goals:  - MAP > 65  - SBP < 180    **Shock (POA)  Etiology likely related to sedation as patient was placed on propofol and deemed to be after intubation.  Patient does present with hypothermia and leukocytosis last septic shock is also a priority  -Requiring low-dose of norepinephrine to maintain maps >65, this improves once propofol was stopped  -ICU admission lactate was negative, leukocytosis resolved  -See ID for rest of plan  02/15/23 - ECHO LV systolic function normal, EF 60-065%. Right heart normal. No overt valvular abnormalities. No PFO.   -I performed a bedside POCUS which did not reveal a collapsible IVC, no pericardial effusion, EF noted to be moderate to normal  - Patient received a total of 2 L of normal saline in the ED.  Since lactate is normal on admission we will conservative additional fluids at this time    **Sinus bradycardia  -Present on admission to ICU, range 40-50.  This improved slightly after propofol was stopped.   - hypoglycemia and hypothermia can be contributing  -EKG shows sinus bradycardia with no AV block  -Troponin negative  - Avoid propofol and dexmedetomidine  - will administer atropine if sustains below 30 bpm or hypotension  - consider cardiology consult    Pulmonary    **Mechanical ventilation (POA)  - intubated since 03/07/23 for inability to protect airway  - SBT and evaluate for potential extubation once awake  -Chest x-ray personally reviewed with the ET tube approximately 3 cm above carina, no acute process  - goal pO2 > 80  - goal pCO2 35-45  - VAP bundle    **tobacco abuse  - smoking since age 51  - place nicotine patch  - smoking cessation counseling      FEN  Body mass index is 23.82 kg/m??.    GI prophylaxis: H2 blocker (intubated)     Foley catheter: Foley required for accurate continuous I/O's.      **Nutrition  - NPO  - consult nutrition for tube feed recs  - awaiting nutrition recs    **Hypoglycemia, labile  -Likely in the setting of pancreatic cancer, liver compromise  - Glucose 60 on admission to ICU administered D50 amp  - Point-of-care test glucose every 2 hours  - Will hold off on starting D5 infusion given fluid shortage, will monitor closely  -NG tube pending verification with x-ray, will start enteral feeds to avoid hypoglycemic episodes  -Hypoglycemia protocol in place    **Fluids/electrolytes  - goal euvolemia  - follow strict ins/outs  - check admission chemistry panel and supplement electrolytes as needed    **Alport  syndrome  **ESRD  Diagnosed in 2009  - was on transplant list but removed given pancreatic CA  - not currently on dialysis, making urine  - patient discussed with nephrology team, accepted peritoneal dialysis  - consult nephrology in AM for in-hospital management      Heme/ID  Current DVT prophylaxis: SCDs, Heparin 5,000 units SQ    - start chemical DVT prophylaxis given high risk    **Pancreatic adenocarcinoma  Newly diagnosed with metastasis to liver (biopsy 02/27/23), coordinating with [facility]  - Evaluated at [facility] 02/15/2023  - treatment plan pending additional studies, staging  - still pending MRI pelvis for full staging  - consult heme/onc in AM      **Hypothermia, leukocytosis  With leukocytosis in the ED, now resolved  - septic work up, pan cultures sent  - RVP pending  -Warming blanket placed  -MRSA swab sent, pending  -Recheck lactate 0.7  -Started cefepime due to acute clinical decline and labs  -Meningitis unlikely based on presentation and history obtained from daughter, will hold LP at this time, will consider if no neurological improvement      Current Access:         -  PIV x 2       - Arterial line: Arterial line required for blood pressure monitoring and blood gases       - Central venous line: No central line present.    Tubes and drains:  Patient Lines/Drains/Airways Status       Active Active Lines, Drains, & Airways       Name Placement date Placement time Site Days    ETT  03/07/23  2005  -- less than 1    Peripheral IV 03/07/23 Left Antecubital 03/07/23  1352  Antecubital  less than 1    Peripheral IV 03/07/23 Anterior;Right Forearm 03/07/23  1956  Forearm  less than 1    Peripheral IV 03/07/23 Anterior;Left Forearm 03/07/23  1957  Forearm  less than 1                           Objective Data     All vital signs and resulted laboratory studies for the past 24 hours have been reviewed.  All ordered medications have been reviewed.    Temp:  [36.8 ??C (98.3 ??F)] 36.8 ??C (98.3 ??F)  Heart Rate:  [49-87] 53  SpO2 Pulse:  [48-92] 53  Resp:  [11-23] 16  BP: (70-173)/(34-90) 170/69  MAP (mmHg):  [47-101] 99  FiO2 (%):  [100 %] 100 %  SpO2:  [92 %-100 %] 99 %    Intake/Output Summary (Last 24 hours) at 03/07/2023 2132  Last data filed at 03/07/2023 2033  Gross per 24 hour   Intake 2001.42 ml   Output --   Net 2001.42 ml        Physical Exam          General Exam:  General: Lying in bed. Appears comfortable. No obvious distress. Intubated.     ENT:  Mucous membranes moist. Oropharynx clear. Endotracheal tube in place.  Cardiovascular:  Regular rate and rhythm.  No murmurs.   2+ radial pulses bilaterally.   Extremities warm and nonedematous.  Respiratory: Breathing is comfortable and unlabored.  Intubated and mechanically ventilated. Lungs are clear to ausculation bilaterally.    Gastrointestinal: Soft, nontender, nondistended. bowel sounds normal  Extremities: Warm and well-perfused. No cyanosis, clubbing, or edema.  Skin: No obvious rashes or ecchymoses.    Neurological Exam:  Mental Status  LOC: arouses to voice  Orientation: UTA  Speech: UTA    Cranial Nerves  Pupils: PERRL  Corneals: present bilaterally  Gaze: normal  Face Motor: UTA  Cough: strong  Tongue: midline    Motor:   RUE: localize  LUE: localize  RLE: withdrawal  LLE: withdrawal     Sensory:    Sensory:  Grossly intact    Glasgow Coma Score  Motor: localizing to pain = 5  Verbal: intubated = 1T  Eyes: eyes open to pain = 2        DISPOSITION     The patient requires admission to the [facility] Intensive Care Unit for management of the above conditions.    Estimated Transfer Date:  TBD        BILLING     This patient is critically ill or injured with the impairment of vital organ systems such that there is a high probability of imminent or life threatening deterioration in the patient's condition. The reason the patient is critically ill and the nature of the treatment and management provided to manage the critically ill patient is as listed in above note.   I directly provided 80 minutes of critical care time as documented in this note. Time includes: direct patient care, patient reassessment, coordination of patient care, interpretation of data, review of patient medical records, and documentation of patient care. This time is exclusive of separately billable procedures.        Judeth Horn stokes, ACNP  Neurocritical Care

## 2023-03-08 NOTE — Unmapped (Signed)
Pt received from ED on transport and placed on Vent, documented settings and tolerating. ETT secured and vent alarms checked and functioning. Small, tan thin secretions suctioned. Weaning FiO2 as tolerated. SBT attempted per provider but pt unable to tolerate per agitation.

## 2023-03-08 NOTE — Unmapped (Signed)
Treatment plan update    Presented for mild cognitive change (impaired focus) now resolved. MRI brain wwo demonstrating tiny multifocal infarcts consistent with embolic process. There is also enhancement of unclear significance (subacute stroke v malignancy). This is similar to her recent stroke evaluation (9/27) and she has completed stroke workup with unremarkable cardiac echo and vessel imaging. Etiology of strokes is almost certainly hypercoagulability of malignancy, other risk factors include active tobacco use, DM, HTN.     Discussed that treatment of malignancy and smoking cessation are paramount. Given no clear deficits on exam she is cleared to discharge home from neuro perspective and is already plugged in with neurology follow up.      Recommendations  Please provide plavix Rx (aspirin allergy)  Continue home rosuvastatin  Instruct patient to stop working, provide work notes to patient and daughter  Follow up with Neurology (Dr Atha Starks) on 10/25 (3 days)  Will need repeat MRI brain in 6-8 weeks for enhancement in left postcentral gyrus  Follow up with oncology on 10/30      Discussed with Dr. Darlina Sicilian, who agrees with the assessment and plan.    Nigel Bridgeman, MD  PGY-4 Neurology

## 2023-03-08 NOTE — Unmapped (Addendum)
ED Progress Note    Approximately 7:05 PM patient noted to be somnolent although opening up her eyes and speaking although with incoherent speech, initially her somnolence was thought to be secondary to her provision of narcotics however her pupil was not pinpoint as well she was not having a decreased respiratory rate.  She maintain her oxygen saturation and her hemodynamic stability.  Called to bedside at approximately 730pm as patient with sonorous respiration, having repetitive motion of her right upper extremity, lip smacking, and obtunded.  Drooling.  Given her history, and recent history of cancer diagnosis as well as intracranial metastasis as well as embolic events, highest concern for seizure, given her underlying intracranial metastasis and CVA, also concern for ICH, subdural, epidural, hemorrhagic conversion, hemorrhagic metastasis, neurology also consulted immediately.  Patient was provided with Ativan, Keppra, GCS of 6 noted and after emergent discussion with the patient's daughter, requested to proceed with full code.  Given her metastatic disease we will also provide her with Decadron for possible cerebral edema.  Therefore will intubate for airway protection.  Neurology updated, patient will need repeat CT of the head to ensure no hemorrhagic conversion and/or hemorrhagic metastasis.  Will protect airway prior to obtaining this.  Critical care VBG also noted to her to have hypoglycemia, which was also demonstrated on her BMP and upon visualizing this an amp of D50 was ordered and was immediately given, however unclear regarding her accuracy of this as a point-of-care glucose was checked x 2 to confirm nearly simultaneously with the provision of an amp of D50 and her sugar was noted to be in the 300s, and it is extremely unlikely that her sugar would have now become elevated without intervention.  Question an erroneous value however she did receive treatment for possible hypoglycemic event as she does have risk factors given her pancreatic cancer, her history of diabetes although she has not received any of her diabetic medications while she has been here in the emergency department. Pt to be admitted to Neuro ICU, discussed this differential with neurology resident as well who is in agreement with care plan.  I have updated daughter thoughout and she is thankful for her mothers care.  Will monitor her sugars closely.  On repeat VBG her sugar shortly after immediate resuscitation now down to 100.  Unclear if she is having erratic sugars secondary to her pancreatic cancer, no obvious insulin pump detected on her evaluation, will CTM her glucose closely while awaiting ICU bed.  May need a glucose infusion if she continues to have recurrent hypoglycemia.    Critical Care    Performed by: [name], MD  Authorized by: [name], MD    Critical care provider statement:     Critical care time (minutes):  75    Critical care time was exclusive of:  Separately billable procedures and treating other patients and teaching time    Critical care was necessary to treat or prevent imminent or life-threatening deterioration of the following conditions:  CNS failure or compromise    Critical care was time spent personally by me on the following activities:  Blood draw for specimens, development of treatment plan with patient or surrogate, discussions with consultants, evaluation of patient's response to treatment, examination of patient, obtaining history from patient or surrogate, ordering and performing treatments and interventions, ordering and review of laboratory studies, ordering and review of radiographic studies, pulse oximetry, re-evaluation of patient's condition, review of old charts and ventilator management  Care discussed with: admitting provider      Care discussed with comment:  Neurology    Procedure performed (intubation). I was present during the entire procedure.

## 2023-03-08 NOTE — Unmapped (Addendum)
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  Shortly thereafter had worsening altered mental status and therefore repeat CTA head was performed with no LVO.  Acute worsening likely due to seizures as described below.  Etiology for her recurrent multifocal strokes is most likely due to hypercoagulable state from her metastatic pancreatic cancer.  She also has tobacco use, diabetes, and hypertension which could be playing a role as well. While anticoagulation would be the treatment for malignancy, we will defer anticoagulation in the setting of further hematological workup.  Will continue Plavix when medically able as patient potentially may have had an allergy to aspirin.     - Non-contrasted CT scan of the head showed no acute abnormality  - A1c 7.5, LDL 69  - echo 10/2 normal. Repeat POCUS here showed trace pericardial effusion  - carotid doppler 02/10/23 showed left IC stenosis <50%  - tele shows sinus bradycardia     Plan:  - Consider transitioning patient to plavix when able due to potential allergy  - resume home rosuvastatin 10mg   - Will need smoking cessation counseling [x]   - CONT. Clopidogrel 75 mg  - PT/OT consulted     #AMS:   #Concern for provoked seizures  Patient was waiting in the emergency department prior to discharge, when she began to have altered mental status.  Last known normal 4 PM, GCS 15 prior to this, but on reevaluation at 8 PM GCS was 7, patient was nonverbal, not opening eyes, moving right side briskly to noxious and left side weakly to noxious.  She was given 4 mg Ativan and 4.5 mg Keppra load. Per ED, sonorous respiration, having repetitive motion of her right upper extremity, lip smacking, and obtunded. She had multiple erratic glucoses while here, initially normal, then low at 18, immediate recheck 357.  Etiology of altered mental status most likely from acute symptomatic seizure from hypoglycemia.  However given the location of the semiology and contrast-enhancing lesion in left hemisphere, seizure could also potentially be from a metastatic lesion.  We would favor continuing antiseizure medicines but currently on hold from concerns for sedation. Patient was removed from antiepileptics given that her seizures were likely provoked by hypoglycemia.   - s/p keppra 4.5g and ativan 4mg      #FEN  - Regular diet  - CONTINUE calorie count  - CONSULTED nutrition, appreciate recs  + Nepro BID  + Pt counseled on limiting sweet tea, sugary drinks  - CONTINUE POCT achs  - Due to mental status, patient declining food  - Will monitor  - Receiving maintenance fluids     # Diabetes -  # Hypoglycemia  Patient had erratic glucoses and at one time received 1 amp of dextrose while in ED and hypoglycemia possibly responsible for AMS/seizures. Unclear etiology as she did not receive any antihyperglycemics in ED, possibly related to pancreatic cancer.  Endocrinology felt this was iatrogenic as she received insulin prior to coming to ED. Negative cosyntropin stim test. Final recommendations listed below for discharge:  - Semglee 25 units daily (glargine)  - Novolog 8 units TID AC  - Sliding scale ISF 50, target 140  Please include following in patient's discharge paperwork:  - Long-acting: Semglee 25 units once daily  - Short-acting: Novolog 8 units three times daily before meals, remember to give 10-15 minutes before your meal  - Correction factor: In addition  to the mealtime regimen above, add 1 unit of Novolog insulin for pre-meal blood sugars every 50 above 150, using the scale below:  - 70-150: no additional units  - 151-200: 1 unit  - 201-250: 2 units  - 251-300: 3 units  - 301-350: 4 units  - 350+: 5 units  - Diabetes educator on Monday, 10/28 (if patient still inpatient)     #Metastatic pancreatic cancer with liver mets  First discovered 01/2023 with presentation for GI symptoms. Biopsy confirmed last week. It is unclear if there is brain involvement at this time. She has followup with hematology scheduled 03/15/2023. Palliative care to FU outpatient. Oncology to follow-up outpatient. Patient with itching while using Oxycodone, provided Benadryl.  - CONTINUE Oxycodone 5 mg q6h. Upon discharge, Oxycodone 5 mg q4h will be continued. 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 Not on dialysis at this time. on home bicarb daily  - CTM     #Transaminitis: likely iso metastatic liver disease  - ctm     # C/f Urinary Retention - AMS  Pt's bladder scans have been less than 50 ml since arrival to floor.  - DISCONTINUE IO caths  - No need for Foley at this time     STABLE PROBLEMS:     # HTN:  Pt with hypotension on arrival to floor likely 2/2 poor PO intake. Received 1 L of maintenance fluids.  - HELD home lisinopril      # Pain:  - holding home lyrica     # HLD:  - home rosuvastatin        RESOLVED PROBLEMS:  # Intubation  - Patient required intubation for low GCS. Now extubated.

## 2023-03-08 NOTE — Unmapped (Addendum)
Neurocritical Care Daily Progress Note     CODE STATUS:    Code Status: Full Code    HCDM (patient stated preference) (Active): Amy [name] - Daughter - [phone]  I discussed and confirmed Code Status with patient or HCDM    Date of service: 03/08/2023  Hospital Day:  LOS: 1 day       Assessment and Plan     Amy [name] is a 62 y.o. female admitted to NSICU with a diagnosis of altered mental status.     Family update: Spoke with Amy [name] (HCDM) via telephone. Updated on plan of care and day events. All questions and concerns addressed.      Neuro:  Mobility goal: 4 (Out of bed to chair assessment    ** Hypoglycemia induced DWI lesions vs Acute Ischemic Stroke  - Suspect symptoms related to hypoglycemia.  2 separate checks showed a glucose of 20, 18 respectively.  Given cancer could be hypercoaguable, clean echo 10/9.    - MRI with small punctate scattered diffusion restriction  - CTA without any vascular findings  - hold ASA with low platelets on CBC  - continue rosuvastatin  - stroke labs: LDL, A1c, TSH monitoring    **Acute Symptomatic Seizure, probable  - right arm jerking, lip smacking  - history of old L sided lesions  - likely provoked in setting of hypoglycemia  - Per discussion with daughter patient does not have any history of seizures  - EEG negative, discontinue  - No seizures noted thus far in the ICU    **Pain  - tylenol prn mild pain  - oxycodone prn moderate pain  - fentanyl prn severe pain      Cardiovascular:  Admission weight: 70.4Kg  Daily Weight: 70.4 kg (155 lb 3.3 oz)     Hemodynamic goals:  - MAP > 65  - SBP < 180    **Shock (POA)  Etiology likely related to sedation as patient was placed on propofol and deemed to be after intubation.  Patient does present with hypothermia and leukocytosis last septic shock is also a priority  -Requiring low-dose of norepinephrine to maintain maps >65, this improves once propofol was stopped  - ICU admission lactate was negative, leukocytosis resolved  -See ID for rest of plan  02/15/23 - ECHO LV systolic function normal, EF 60-65%. Right heart normal. No overt valvular abnormalities. No PFO.     **Sinus bradycardia  - improved after propofol stopped  - hypoglycemia and hypothermia can be contributing  - EKG shows sinus bradycardia with no AV block, troponin negative    Pulmonary    **Mechanical ventilation (POA)  - intubated since 03/07/23 for inability to protect airway  - SBT and evaluate for potential extubation once awake  - CXR without consolidation  - goal pO2 > 80  - goal pCO2 35-45  - VAP bundle    **tobacco abuse  - smoking since age 27  - place nicotine patch  - smoking cessation counseling      FEN  Body mass index is 24.65 kg/m??.    GI prophylaxis: H2 blocker (intubated)  Last BM Date: 03/07/23  Foley catheter: Foley required for accurate continuous I/O's.      **Nutrition  - NPO  - consult nutrition for tube feed recs  - awaiting nutrition recs    **Hypoglycemia, labile  - likely in the setting of pancreatic cancer, liver compromise  - maybe be responsible for DWI lesions  - Glucose 60 on admission  to ICU administered D50 amp, readings of 18 and 20 per report  - Hypoglycemia protocol in place      **Fluids/electrolytes  - goal euvolemia  - follow strict ins/outs  - check admission chemistry panel and supplement electrolytes as needed    **Alport syndrome  **ESRD  - diagnosed in 2009  - was on transplant list but removed given pancreatic CA  - patient discussed with nephrology team, accepted peritoneal dialysis  - nephrology consulted    **Hyperkalemia  - 6.2 to 5 coming down  - EKG without peaked T-wave worsening  - continue lokelma  - monitor closely and shift with insulin and dextrose if necessary    Heme/ID  Current DVT prophylaxis: SCDs, Heparin 5,000 units SQ    - start chemical DVT prophylaxis given high risk    **Pancreatic adenocarcinoma  Newly diagnosed with metastasis to liver (biopsy 02/27/23), coordinating with [facility]  - Evaluated at [facility] 02/15/2023  - treatment plan pending additional studies, staging  - still pending MRI pelvis for full staging  - consult heme/onc     **Hypothermia, leukocytosis  - likely related to hypoglycemia  - septic work up, pan cultures sent  - RVP pending  - Warming blanket placed  - MRSA swab sent, pending  - discontinue cefepime      Current Access:         -  PIV x 2       - Arterial line: Arterial line required for blood pressure monitoring and blood gases       - Central venous line: No central line present.    Tubes and drains:  Patient Lines/Drains/Airways Status       Active Active Lines, Drains, & Airways       Name Placement date Placement time Site Days    Urethral Catheter Temperature probe 16 Fr. 03/07/23  2200  Temperature probe  less than 1    Peripheral IV 03/07/23 Anterior;Right Forearm 03/07/23  1956  Forearm  less than 1    Peripheral IV 03/07/23 Anterior;Left Forearm 03/07/23  1957  Forearm  less than 1    Peripheral IV Posterior;Right Wrist --  --  Wrist  --                           Objective Data     All vital signs and resulted laboratory studies for the past 24 hours have been reviewed.  All ordered medications have been reviewed.    Temp:  [34.4 ??C (93.9 ??F)-36.8 ??C (98.3 ??F)] 34.4 ??C (93.9 ??F)  Core Temp:  [34.6 ??C (94.3 ??F)-37.7 ??C (99.9 ??F)] 37.1 ??C (98.8 ??F)  Heart Rate:  [39-87] 64  SpO2 Pulse:  [40-92] 64  Resp:  [9-25] 9  BP: (70-196)/(34-90) 97/47  MAP (mmHg):  [47-132] 60  A BP-1: (111-117)/(50-55) 111/50  MAP:  [65 mmHg-70 mmHg] 70 mmHg  A BP-3: (111-152)/(49-63) 111/49  MAP:  [69 mmHg-90 mmHg] 69 mmHg  FiO2 (%):  [40 %-100 %] 40 %  SpO2:  [92 %-100 %] 97 %    Intake/Output Summary (Last 24 hours) at 03/08/2023 1034  Last data filed at 03/08/2023 0800  Gross per 24 hour   Intake 2281.75 ml   Output 1855 ml   Net 426.75 ml        Physical Exam          General Exam:  General: Lying in bed. Appears  comfortable. No obvious distress. Intubated.     ENT: Mucous membranes moist. Oropharynx clear. Endotracheal tube in place.  Cardiovascular:  Regular rate and rhythm.  No murmurs.   2+ radial pulses bilaterally.   Extremities warm and nonedematous.  Respiratory: Breathing is comfortable and unlabored.  Intubated and mechanically ventilated. Lungs are clear to ausculation bilaterally.    Gastrointestinal: Soft, nontender, nondistended. bowel sounds normal  Extremities: Warm and well-perfused. No cyanosis, clubbing, or edema.  Skin: No obvious rashes or ecchymoses.    Neurological Exam:  Mental Status  LOC: arouses to voice  Orientation: UTA  Speech: UTA    Cranial Nerves  Pupils: PERRL  Corneals: present bilaterally  Gaze: normal  Face Motor: UTA  Cough: strong  Tongue: midline    Motor:   RUE: localize  LUE: localize  RLE: withdrawal  LLE: withdrawal     Sensory:    Sensory:  Grossly intact    Glasgow Coma Score  Motor: localizing to pain = 5  Verbal: intubated = 1T  Eyes: eyes open to pain = 2        DISPOSITION     The patient requires admission to the [facility] for management of the above conditions.    Estimated Transfer Date:  TBD        BILLING     This patient is critically ill or injured with the impairment of vital organ systems such that there is a high probability of imminent or life threatening deterioration in the patient's condition. The reason the patient is critically ill and the nature of the treatment and management provided to manage the critically ill patient is as listed in above note.   I directly provided 35 minutes of critical care time as documented in this note. Time includes: direct patient care, patient reassessment, coordination of patient care, interpretation of data, review of patient medical records, and documentation of patient care. This time is exclusive of separately billable procedures.        Catalina Antigua, MD  Neurocritical Care

## 2023-03-08 NOTE — Unmapped (Signed)
Bed: HALL-01A  Expected date:   Expected time:   Means of arrival:   Comments:

## 2023-03-08 NOTE — Unmapped (Signed)
Neurology Inpatient Team A [facility])  Daily Progress Note       Patient: Amy [name]  Code Status: Full Code  Level of Care: ICU status.   LOS: 1 day      Overnight Events & Subjective:     - Overnight, patient's propofol was transitioned to fentanyl due to concerns for bradycardia and hypothermia. Cefepime started.   - This morning, patient attempted to self extubate. Sedation was increased. Upon exam with sedation, patient became agitated. Team was called and patient was extubated. She was following commands and moving all extremities spontaneously.   - Plan of care was updated with family at bedside.      Physical Exam:       General Exam:  General Appearance:Chronically ill appearing.  HEENT: Head is atraumatic and normocephalic. Sclera anicteric without injection. Oropharyngeal membranes are moist with no erythema or exudate. EEG leads on.   Neck: Supple.  Lungs: Ventilated  Heart: WWP    Neurological Exam:  Mental Status: Awakened by voice. Able to say name after extubation. Follows commands.   Cranial Nerves: PERRL. 2-3 mm (patient recently received fentanyl). Face grossly symmetrical.   Motor Exam: Moving all extremities spontaneously.   Reflexes: Deferred   Sensory: Deferred due to agitation    Cerebellar/Coordination/Gait: Deferred due to agitation       Assessment/Plan:       Assessment: Amy [name] is a 62 y.o. female with a past medical history of CKD5, metastatic pancreatic adenocarcinoma (liver mets),  who was admitted to [facility] on 03/07/2023 for recurrent embolic strokes and altered mental status.       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  Shortly thereafter had worsening altered mental status and therefore repeat CTA head was performed with no LVO.  Acute worsening likely due to seizures as described below.  Etiology for her recurrent multifocal strokes is most likely due to hypercoagulable state from her metastatic pancreatic cancer.  She also has tobacco use, diabetes, and hypertension which could be playing a role as well. While anticoagulation would be the treatment for malignancy, we will defer anticoagulation in the setting of further hematological workup.  Will continue Plavix when medically able as patient potentially may have had an allergy to aspirin.     - Non-contrasted CT scan of the head showed no acute abnormality  - A1c 7.5, LDL 69  - echo 10/2 normal. Repeat POCUS here showed trace pericardial effusion  - carotid doppler 02/10/23 showed left IC stenosis <50%  - tele shows sinus bradycardia     Plan:  - Consider transitioning patient to plavix when able due to potential allergy  - resume home rosuvastatin 10mg   - Will need smoking cessation counseling      #AMS:   #Concern for provoked seizures  Patient was waiting in the emergency department prior to discharge, when she began to have altered mental status.  Last known normal 4 PM, GCS 15 prior to this, but on reevaluation at 8 PM GCS was 7, patient was nonverbal, not opening eyes, moving right side briskly to noxious and left side weakly to noxious.  She was given 4 mg Ativan and 4.5 mg Keppra load. Per ED, sonorous respiration, having repetitive motion of her right upper extremity, lip smacking, and obtunded. She had multiple erratic glucoses while here, initially normal, then low at 18, immediate recheck  357.  Etiology of altered mental status most likely from acute symptomatic seizure from hypoglycemia.  However given the location of the semiology and contrast-enhancing lesion in left hemisphere, seizure could also potentially be from a metastatic lesion.  We would favor continuing antiseizure medicines.   - s/p keppra 4.5g and ativan 4mg   - Would discontinue video EEG       # diabetes:  # Hypoglycemia  Patient had erratic glucoses and at one time received 1 amp of dextrose while in ED and hypoglycemia possibly responsible for AMS/seizures. Unclear etiology as she did not receive any antihyperglycemics in ED, possibly related to pancreatic cancer.  We would recommend consulting endocrinology.   -Insulin regimen per ICU  -Recommend consulting endocrinology     #Metastatic pancreatic cancer with liver mets  First discovered 01/2023 with presentation for GI symptoms. Biopsy confirmed last week. It is unclear if there is brain involvement at this time. She has followup with hematology scheduled 03/15/2023.  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 Not on dialysis at this time. on home bicarb daily  - CTM     #Transaminitis: likely iso metastatic liver disease  - ctm      STABLE PROBLEMS:  # HTN:  - home lisinopril      # Pain:  - holding home lyrica     # HLD:  - home rosuvastatin      RESOLVED PROBLEMS:  # Intubation  - Patient required intubation for low GCS. Now extubated.       # Discharge Planning:   - Case management: pending.  - Social work: N/A  - PT: consulted, TBD  - OT: consulted, TBD  - SLP: consulted, TBD  - PM&R: N/A  - Expected Discharge Disposition: TBD.  Facility: TBD  - Follow-up appointments with TBD.     # Checklist:  - Diet: Strict NPO until patient passes bedside dysphagia screen or evaluation by speech-language pathology  - IV fluids: no  - Bowel Regimen: Miralax 17 g daily and Senna 2 tabs at night  - GI PPX: H2 blocker  - DVT PPX: Heparin 5000 units Stanley q8h  - Lines/Access: PIV x1.    - Foley: No    This patient was seen and discussed with Dr. Darlina Sicilian, who agrees with the above assessment and plan.      This patient is co-managed with the NSICU Service. Under this co-management agreement, the NSICU serves as the first-call team. Please page the NSICU team at [phone] for any questions/concerns.  Neurology Team A resident can be reached at [phone].    Hermenia Fiscal, DO  PGY-2 Neurology         Data Review: Discussed in A/P

## 2023-03-08 NOTE — Unmapped (Cosign Needed)
Neurocritical Care   Supplementary Progress Note        ASSESSMENT / PLAN     Amy [name] is a 62 y.o. female admitted to NSICU with a diagnosis of altered mental status.  Please see today's progress note for full details.  Ongoing management of the active conditions and plan is below.      ** Altered Mental Status  - Suspect symptoms related to hypoglycemia  - Q2 neuro exams    **Seizures  - Discontinue EEG monitoring  - Keppra 500mg  Q12    **Stroke prevention   - start atorvastatin 40 mg daily  - hold ASA at this time for low platelets    **Mechanical Ventilation  - Patient extubated this morning to 6L [place].   - Stable respiratory status  - RA    **Mobility  - PT/OT    **Hypoglycemia  - Q6 POCT  - Endocrinology consult for labile glucose (see note 10/23)    - Cosyntropin stim test with serial cortisol levels in AM    - No ordered inulin at this time  - Speech cleared for regular diet with aspiration precautions    **Hyperkalemia  - Continue lokelma  - Recheck on AM labs     **Hypothermia, leukocytosis   - likely related to hypoglycemia  - negative infectious workup at this time  - discontinue cefepime    **Family communication    - Family at bedside       OBJECTIVE DATA       Vitals Reviewed:   Temp:  [34.4 ??C (93.9 ??F)] 34.4 ??C (93.9 ??F)  Core Temp:  [34.6 ??C (94.3 ??F)-37.7 ??C (99.9 ??F)] 36.9 ??C (98.4 ??F)  Heart Rate:  [39-86] 68  SpO2 Pulse:  [40-92] 68  Resp:  [9-25] 10  BP: (70-196)/(34-90) 106/49  MAP (mmHg):  [47-132] 66  A BP-1: (107-117)/(47-56) 114/50  MAP:  [65 mmHg-77 mmHg] 72 mmHg  A BP-3: (111-152)/(49-63) 111/49  MAP:  [69 mmHg-90 mmHg] 69 mmHg  FiO2 (%):  [40 %-100 %] 40 %  SpO2:  [92 %-100 %] 94 %  Temp (24hrs), Avg:34.4 ??C (93.9 ??F), Min:34.4 ??C (93.9 ??F), Max:34.4 ??C (93.9 ??F)    SpO2: 94 %  Height: 169 cm (5' 6.54)   Weight: 70.4 kg (155 lb 3.3 oz)   Body mass index is 24.65 kg/m??.    Body surface area is 1.82 meters squared.     Patient on RA    NEURO EXAM      Mental status:   LOC: Awake and alert  Orientation: person, place, time  Speech: Normal  Language: normal    Cranial nerves:   Pupils: PERRL  Corneal reflex: normal  Gaze: normal  Face sensory: normal  Face motor: normal  Vestibular: normal  Cough: normal  Gag: normal  Tongue: midline,     Motor:   RUE: commands, spontaneous, and 5/5  LUE: commands, spontaneous, and 5/5  RLE: commands, spontaneous, and 5/5  LLE: commands, spontaneous, and 5/5     Sensory: Intact       BILLING     This patient is critically ill or injured with the impairment of vital organ systems such that there is a high probability of imminent or life threatening deterioration in the patient's condition. The reason the patient is critically ill and the nature of the treatment and management provided to manage the critically ill patient is as listed in above note.   I directly provided 30 minutes  of critical care time as documented in this note. Time includes: direct patient care, patient reassessment, coordination of patient care, interpretation of data, review of patient medical records, and documentation of patient care. This time is exclusive of separately billable procedures.    Virginia Crews, AGNP  Neurocritical Care

## 2023-03-08 NOTE — Unmapped (Addendum)
ED Procedure Note    Intubation    Date/Time: 03/07/2023 8:14 PM    Performed by: Cheryle Horsfall, MD  Authorized by: [name], MD    Consent:     Consent obtained:  Emergent situation  Pre-procedure details:     Pretreatment medications:  Midazolam    Paralytics:  Succinylcholine  Procedure details:     Preoxygenation:  Bag valve mask    Intubation method:  Oral    Oral intubation technique:  Video-assisted    Laryngoscope blade:  Mac 3    Tube size (mm):  7.5    Tube type:  Cuffed    Number of attempts:  1    Tube visualized through cords: yes    Placement assessment:     ETT to lip:  23    ETT to teeth:  24    Tube secured with:  ETT holder    Breath sounds:  Equal    Placement verification: chest rise    Post-procedure details:     Patient tolerance of procedure:  Tolerated well, no immediate complications

## 2023-03-08 NOTE — Unmapped (Signed)
Patient: Amy [name]  Date of Birth: 05-09-1961  Attending: Reece Packer,  M.D.  Ordering Provider: Joycelyn Das  [facility] No: [id number]      DATE STARTED: 03/08/2023 00:06  DATE ENDED: 03/08/2023 15:35     HISTORY:  62 y.o. right handed female with history of ESRD who presented to the emergency room on 03/07/2023 with concerns of confusion, episodic blurry vision.   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 There was also an evolving right occipital infarct.  While waiting for discharge patient was noted to be somnolent and having incoherent speech.  Patient further declined to sonorous respirations, had repetitive right upper extremity movements and became obtunded. BMP showed a glucose of 20, and a blood gas showed a glucose of 18.    cEEG to eval sz     PROCEDURE:  Continuous EEG with simultaneous video recording was performed utilizing 21 active electrodes placed according to the international 10-20 system.  The study was recorded digitally with a bandpass of 1-70Hz  and a sampling rate of 200Hz  and was reviewed with the possibility of multiple reformatting.  The study was digitally processed with potential spike and seizure events identified for physician analysis and review.  Patient recognized events were identified by a push button marker and reviewed by the physician.   Simultaneous video was reviewed for all patient events.     TECHNICAL DESCRIPTION:  Background activities are continuous and initially are comprised of mixed alpha, theta, and delta range frequencies during maximal alertness. Some generalized rhythmic delta activity (GRDA) can be seen at times.  Possible posterior dominant rhythm of 6 cps is seen. There is some mild improvement in the degree of background slowing over the course of the study recording.  State changes/reactivity are present during the study recording. No interictal abnormalities are seen. No electrographic event suggestive of a seizure is seen.     IMPRESSION:  This EEG is abnormal secondary to initially moderate diffuse slowing subsequently improving to mild to moderate diffuse slowing.     CLINICAL INTERPRETATION:  Diffuse slowing is indicative of bihemispheric dysfunction which could be on the basis of a toxic, metabolic, or primary neuronal disorder.

## 2023-03-08 NOTE — Unmapped (Cosign Needed)
Neurocritical Care   Procedure Note       Procedure: insertion of arterial line    Pre-procedure diagnosis:  Seizures  Indications: Frequent blood draws    Arterial line  Date/Time: 03/08/2023 at 3:15 am  Performed by:  Adele Dan, MD with supervision by Orson Aloe ACNP  Consent: Written consent obtained.  The risks of the procedure including but not limited to pain, bleeding, hematoma formation, infection, distal ischemia, and injury to blood vessels and/or nerves were discussed with the patient's surrogate decision maker.   Patient identity confirmed: arm band, hospital-assigned identification number or anonymous protocol, patient vented/unresponsive  Time out: Immediately prior to procedure a time out was called to verify the correct patient, procedure, equipment, support staff, labs and/or imaging as required.  Preparation: Patient was prepped and draped in the usual sterile fashion.  Anesthetic:  fentanyl  Location: right radial  Number of attempts: 1  Complications: None      Procedure:  After a time out to verify patient identity, procedure, site, side, and contraindications the patient was appropriately positioned. The site was prepped with chlorhexidine and draped in the usual sterile fashion. Local anesthetic with 1% lidocaine. The radial artery was located via ultrasound, cannulated easily and a 20-gauge, 4.45cm Arrow catheter was inserted without difficulty via sterile seldinger technique. The guidewire was removed and blood flow was brisk and pressure tubing was attached. A good waveform was noted. The catheter was secured with a suture and a sterile dressing was applied. The patient tolerated the procedure well and remained stable in the immediate post procedure period. No obvious complications were appreciated at the conclusion of the procedure.      Adele Dan, MD  Neurocritical Care    Alphonsa Gin, AG-ACNP-BC  Neurocritical Care

## 2023-03-08 NOTE — Unmapped (Signed)
Endocrine Team Diabetes New Consult Note     Consult information:  Requesting Attending Physician : Morene Antu, DO  Service Requesting Consult : Neurology (NEU)  Primary Care Provider: [name], MD  Impression:  Amy [name] is a 62 y.o. 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 We have been consulted at the request of [facility], DO to evaluate Amy [name] for hypoglycemia.     Medical Decision Making:  Diagnoses:  1.Type 2 Diabetes. Uncontrolled With both hypoglycemia and hyperglycemia.  2. Nutrition: Complicating glycemic control. Increasing risk for both hypoglycemia and hyperglycemia.  3. Chronic Kidney Disease. Complicating glycemic control and increasing risk for hypoglycemia.      Studies reviewed 03/08/23:  Labs: CBC, BMP, POCT-BG, and HbA1C  Interpretation: Patient with intermittent episodes of hypoglycemia, as well as hyperglycemia with tube feeds.   Notes reviewed: Primary team      Overall impression based on above reviews and history:  Amy [name] is a  62 y.o. 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 Of note, during a seizure event in the ED, she was found to be hypoglycemic to 18, necessitating dextrose. Since starting tube feeds, she has been more eu- to hyperglycemic. She is on short- and long-acting insulin at home, although the exact doses are unknown as patient not able to communicate this due to AMS. Her diabetes is managed by a provider at an outside network, per her daughter--unable to see this records in EMR. Patient's daughter believes she last took her insulin prior to presenting to the ED. Suspect that the cause of her hypoglycemia is likely iatrogenic. However, given somewhat dusky appearance of her skin tone on physical exam, would recommend cosyntropin stim test to rule out adrenal insufficiency.     Recommendations:  - Cosyntropin stim test (please check cortisol levels at 17m, 15 min, and 30 min to ensure accuracy(  -Hypoglycemia protocol.  -POCT-BG achs.  -Ensure patient is on glucose precautions if patient taking nutrition by mouth.     Discharge planning:  In process. Will complete closer to discharge.    Thank you for this consult. Discussed plan with primary team. We will continue to follow and make recommendations and place orders as appropriate.    Please page with questions or concerns: Endocrine Fellow: [phone]  Endocrinology Diabetes Care Team on call from 6AM - 3PM on weekdays then endocrine fellow on call: [phone] from 3PM - 6AM on weekdays and on weekends and holidays.   If APP cannot be reached, please page the endocrine fellow on call.      Subjective:  Initial HPI:  Amy [name] is a 62 y.o. 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 Of note, during a seizure event in the ED, she was found to be hypoglycemic to 18, necessitating dextrose. Since starting tube feeds, she has been more eu- to hyperglycemic. She is on short- and long-acting insulin at home, although the exact doses are unknown as patient not able to communicate this due to AMS. Her diabetes is managed by a provider at an outside network, per her daughter--unable to see this records in EMR. Patient's daughter believes she last took her insulin prior to presenting to the ED.     Diabetes History:  Patient has a history of Type 2 diabetes   Current home diabetes regimen: on glargin and novolog at home, unclear how many units.  Current home blood glucose monitoring: patient unable to answer.  Hypoglycemia awareness: patient unable to answer.  Complications related to diabetes: chronic kidney disease 2/2 Alport, but DM2 likely contributing       Current Nutrition:  Active Orders There are no active orders of the following types: Diet.       ROS: As per HPI.     cefepime  1 g Intravenous Q12H    EPINEPHrine        famotidine (PEPCID) IV  20 mg Intravenous Daily    heparin (porcine) for subcutaneous use  5,000 Units Subcutaneous Q8H SCH    insulin regular  0-20 Units Subcutaneous Q6H SCH    levETIRAcetam  500 mg Intravenous Q12H SCH    midazolam (PF)        nicotine  1 patch Transdermal Daily    Followed by    Melene Muller ON 03/15/2023] nicotine  1 patch Transdermal Daily    polyethylene glycol  17 g Enteral tube: gastric Daily    senna  2 tablet Enteral tube: gastric Nightly    sodium zirconium cyclosilicate  10 g Oral BID       Current Outpatient Medications   Medication Instructions    aspirin (ECOTRIN) 81 mg, Oral, Daily (standard)    AURYXIA 210 mg, Oral, 3 times a day (with meals)    blood sugar diagnostic (TRUE METRIX GLUCOSE TEST STRIP) Strp True Metrix Glucose Test Strip    cholecalciferol (vitamin D3-25 mcg (1,000 unit)) 1,000 Units, Oral, Daily (standard)    clobetasol (TEMOVATE) 0.05 % external solution Topical, 2 times a day (standard), To scalp    clobetasol (TEMOVATE) 0.05 % ointment Apply to your thick psoriasis areas twice daily until clear    cyclobenzaprine (FLEXERIL) 10 mg, Oral, Nightly    lisinopril (PRINIVIL,ZESTRIL) 10 mg, Oral, Daily (standard)    nicotine (NICODERM CQ) 14 mg/24 hr patch 1 patch, Transdermal, Every 24 hours, Remove old patch before applying new patch on skin. After 8 weeks, switch to 7 mg patches.    nicotine (NICODERM CQ) 21 mg/24 hr patch 1 patch, Transdermal, Every 24 hours, Remove old patch before applying new patch on skin. After 8 weeks, switch to 14 mg patches.    nicotine (NICODERM CQ) 7 mg/24 hr patch 1 patch, Transdermal, Every 24 hours    NOVOLOG FLEXPEN U-100 INSULIN 100 unit/mL (3 mL) injection pen No dose, route, or frequency recorded.    oxyCODONE (ROXICODONE) 5 mg, Oral, Every 4 hours PRN    pen needle, diabetic 32 gauge x 5/32 (4 mm) Ndle Injection, 3 times a day (AC)    pregabalin (LYRICA) 25 MG capsule No dose, route, or frequency recorded.    rosuvastatin (CRESTOR) 10 mg, Oral, Daily (standard)    SEMGLEE,INSULIN GLARG-YFGN,PEN 100 unit/mL (3 mL) InPn     SYMBICORT 160-4.5 mcg/actuation inhaler No dose, route, or frequency recorded.    TRUEPLUS PEN NEEDLE 32 gauge x 5/32 Ndle No dose, route, or frequency recorded.    ustekinumab (STELARA) 45 mg/0.5 mL Syrg syringe Inject the contents of 1 syringe (45 mg) under the skin once every 12 weeks.           Past Medical History:   Diagnosis Date    Alport syndrome     Back pain     Chronic kidney disease  Dental abscess     Diabetes mellitus (CMS-HCC)     type 2, don't remember A1C, controlled with insulin injections; okay per physician at last visit    Hyperlipidemia     Hypertension     well controlled with meds    Kidney stone     no problems since 1995    Psoriasis        Past Surgical History:   Procedure Laterality Date    BREAST BIOPSY Right 05/30/2018    Intraductal papilloma w/ florid DH & apocrine metaplasia    CATARACT EXTRACTION Right 08/29/2017    COLONOSCOPY      cyst removal in breast      HYSTERECTOMY      INSERT ARTERIAL LINE  03/08/2023    NEPHROSTOMY      OOPHORECTOMY      ONLY ONE OVARY REMOVED    PR COLSC FLX W/RMVL OF TUMOR POLYP LESION SNARE TQ N/A 06/19/2018    Procedure: COLONOSCOPY FLEX; W/REMOV TUMOR/LES BY SNARE;  Surgeon: Jarvis Morgan, MD;  Location: [facility];  Service: Gastroenterology    PR XCAPSL CTRC RMVL INSJ IO LENS PROSTH W/O ECP Right 08/29/2017    Procedure: EXTRACAPSULAR CATARACT REMOVAL W/INSERTION OF INTRAOCULAR LENS PROSTHESIS, MANUAL OR MECHANICAL TECHNIQUE Lens Choice:OD ZCB00 +19.50 [id number] +19.00/+18.00;  Surgeon: O'Rese Bonney Roussel, MD;  Location: [facility];  Service: Ophthalmology    PR XCAPSL CTRC RMVL INSJ IO LENS PROSTH W/O ECP Left 09/19/2017    Procedure: EXTRACAPSULAR CATARACT REMOVAL W/INSERTION OF INTRAOCULAR LENS PROSTHESIS, MANUAL OR MECHANICAL TECHNIQUE Lens Choice: ZCB00 +21.00; ZA9003 +20.00/+19.00;  Surgeon: O'Rese Bonney Roussel, MD;  Location: [facility] OR [facility];  Service: Ophthalmology       Family History   Problem Relation Age of Onset    Kidney disease Mother     Diabetes Mother     Cataracts Mother     Heart attack Father     No Known Problems Daughter     Cancer Paternal Grandmother     No Known Problems Other     Breast cancer Neg Hx     Colon cancer Neg Hx     Endometrial cancer Neg Hx     Ovarian cancer Neg Hx     Melanoma Neg Hx     Basal cell carcinoma Neg Hx     Squamous cell carcinoma Neg Hx     BRCA 1/2 Neg Hx        Social History     Tobacco Use    Smoking status: Former     Current packs/day: 0.00     Average packs/day: 1 pack/day for 43.0 years (43.0 ttl pk-yrs)     Types: Cigarettes     Start date: 02/12/1978     Quit date: 02/12/2021     Years since quitting: 2.0    Smokeless tobacco: Never   Vaping Use    Vaping status: Every Day   Substance Use Topics    Alcohol use: No    Drug use: No       OBJECTIVE:  BP 105/46  - Pulse 66  - Temp (!) 34.4 ??C (93.9 ??F)  - Resp 10  - Ht 169 cm (5' 6.54)  - Wt 70.4 kg (155 lb 3.3 oz)  - SpO2 98%  - BMI 24.65 kg/m??   Wt Readings from Last 12 Encounters:   03/07/23 70.4 kg (155 lb 3.3 oz)   02/15/23 69.9 kg (  154 lb)   02/01/23 70.7 kg (155 lb 12.8 oz)   12/18/22 67.1 kg (148 lb)   10/26/22 70.3 kg (155 lb)   07/27/22 70.3 kg (155 lb)   04/27/22 70.4 kg (155 lb 4.8 oz)   02/09/22 72.9 kg (160 lb 11.2 oz)   12/15/21 75.8 kg (167 lb 3.2 oz)   09/15/21 75.5 kg (166 lb 8 oz)   07/21/21 75.1 kg (165 lb 9.6 oz)   05/26/21 76.9 kg (169 lb 8 oz)           BG/insulin reviewed per EMR.   Glucose, POC (mg/dL)   Date Value   16/02/9603 118   03/08/2023 72   03/08/2023 75   03/08/2023 126   03/08/2023 131   03/07/2023 188 (H)   03/07/2023 362 (H)   03/07/2023 64 (L)        Summary of labs:  Lab Results   Component Value Date    A1C 7.5 (H) 02/15/2023    A1C 7.3 (H) 02/09/2023    A1C 6.2 (H) 12/14/2021     Lab Results   Component Value Date    CREATININE 3.98 (H) 03/08/2023     Lab Results   Component Value Date    WBC 8.4 03/07/2023    HGB 11.1 (L) 03/07/2023    HCT 33.2 (L) 03/07/2023    PLT 71 (L) 03/07/2023       Lab Results   Component Value Date    NA 140 03/08/2023    K 4.9 (H) 03/08/2023    CL 115 (H) 03/08/2023    CO2 18.0 (L) 03/08/2023    BUN 60 (H) 03/08/2023    CREATININE 3.98 (H) 03/08/2023    GLU 118 03/08/2023    CALCIUM 7.7 (L) 03/08/2023    MG 2.2 03/08/2023    PHOS 5.7 (H) 03/08/2023       Lab Results   Component Value Date    BILITOT 0.6 03/07/2023    BILIDIR 0.30 03/07/2023    PROT 5.9 03/07/2023    ALBUMIN 3.0 (L) 03/08/2023    ALT 172 (H) 03/07/2023    AST 87 (H) 03/07/2023    ALKPHOS 396 (H) 03/07/2023    GGT 27 08/27/2020

## 2023-03-09 LAB — CBC
HEMATOCRIT: 34.4 % (ref 34.0–44.0)
HEMOGLOBIN: 11.3 g/dL (ref 11.3–14.9)
MEAN CORPUSCULAR HEMOGLOBIN CONC: 33 g/dL (ref 32.0–36.0)
MEAN CORPUSCULAR HEMOGLOBIN: 28.7 pg (ref 25.9–32.4)
MEAN CORPUSCULAR VOLUME: 87 fL (ref 77.6–95.7)
MEAN PLATELET VOLUME: 8.4 fL (ref 6.8–10.7)
PLATELET COUNT: 71 10*9/L — ABNORMAL LOW (ref 150–450)
RED BLOOD CELL COUNT: 3.95 10*12/L (ref 3.95–5.13)
RED CELL DISTRIBUTION WIDTH: 14.6 % (ref 12.2–15.2)
WBC ADJUSTED: 9.2 10*9/L (ref 3.6–11.2)

## 2023-03-09 LAB — BASIC METABOLIC PANEL
ANION GAP: 10 mmol/L (ref 5–14)
BLOOD UREA NITROGEN: 67 mg/dL — ABNORMAL HIGH (ref 9–23)
BUN / CREAT RATIO: 16
CALCIUM: 7.9 mg/dL — ABNORMAL LOW (ref 8.7–10.4)
CHLORIDE: 119 mmol/L — ABNORMAL HIGH (ref 98–107)
CO2: 17 mmol/L — ABNORMAL LOW (ref 20.0–31.0)
CREATININE: 4.21 mg/dL — ABNORMAL HIGH
EGFR CKD-EPI (2021) FEMALE: 12 mL/min/{1.73_m2} — ABNORMAL LOW (ref >=60)
GLUCOSE RANDOM: 199 mg/dL — ABNORMAL HIGH (ref 70–179)
POTASSIUM: 4.4 mmol/L (ref 3.4–4.8)
SODIUM: 146 mmol/L — ABNORMAL HIGH (ref 135–145)

## 2023-03-09 LAB — MAGNESIUM: MAGNESIUM: 2.4 mg/dL (ref 1.6–2.6)

## 2023-03-09 LAB — CORTISOL
CORTISOL TOTAL: 21.2 ug/dL
CORTISOL TOTAL: 41.1 ug/dL
CORTISOL TOTAL: 42.9 ug/dL

## 2023-03-09 LAB — PHOSPHORUS: PHOSPHORUS: 6.2 mg/dL — ABNORMAL HIGH (ref 2.4–5.1)

## 2023-03-09 MED ADMIN — lactated ringers bolus 500 mL: 500 mL | INTRAVENOUS | @ 19:00:00 | Stop: 2023-03-09

## 2023-03-09 MED ADMIN — cosyntropin (CORTROSYN) injection 0.25 mg: .25 mg | INTRAVENOUS | @ 12:00:00 | Stop: 2023-03-09

## 2023-03-09 MED ADMIN — insulin regular (HumuLIN,NovoLIN) injection 0-20 Units: 0-20 [IU] | SUBCUTANEOUS | @ 03:00:00

## 2023-03-09 MED ADMIN — heparin (porcine) 5,000 unit/mL injection 5,000 Units: 5000 [IU] | SUBCUTANEOUS | @ 18:00:00

## 2023-03-09 MED ADMIN — heparin (porcine) 5,000 unit/mL injection 5,000 Units: 5000 [IU] | SUBCUTANEOUS | @ 10:00:00

## 2023-03-09 MED ADMIN — heparin (porcine) 5,000 unit/mL injection 5,000 Units: 5000 [IU] | SUBCUTANEOUS | @ 01:00:00

## 2023-03-09 MED ADMIN — senna (SENOKOT) tablet 2 tablet: 2 | GASTROENTERAL | @ 01:00:00 | Stop: 2023-03-21

## 2023-03-09 MED ADMIN — nicotine (NICODERM CQ) 14 mg/24 hr patch 1 patch: 1 | TRANSDERMAL | @ 14:00:00 | Stop: 2023-03-15

## 2023-03-09 MED ADMIN — lactated ringers bolus 500 mL: 500 mL | INTRAVENOUS | @ 17:00:00 | Stop: 2023-03-09

## 2023-03-09 MED ADMIN — sodium zirconium cyclosilicate (LOKELMA) packet 10 g: 10 g | ORAL | @ 01:00:00 | Stop: 2023-03-10

## 2023-03-09 MED ADMIN — levETIRAcetam (KEPPRA) tablet 500 mg: 500 mg | ORAL | @ 01:00:00

## 2023-03-09 NOTE — Unmapped (Signed)
Neurocritical Care Daily Progress Note     CODE STATUS:    Code Status: Full Code    HCDM (patient stated preference) (Active): Donnamarie Rossetti - Daughter - [phone]  I discussed and confirmed Code Status with patient or HCDM    Date of service: 03/09/2023  Hospital Day:  LOS: 2 days       Assessment and Plan     Jenese Foushee is a 62 y.o. female admitted to NSICU with a diagnosis of altered mental status.     Family update: Spoke with Donnamarie Rossetti (HCDM) via telephone. Updated on plan of care and day events. All questions and concerns addressed.      Neuro:  Mobility goal: 4 (Out of bed to chair assessment    **Hypoglycemia induced DWI lesions vs Acute Ischemic Stroke  - Suspect symptoms related to hypoglycemia.  2 separate checks showed a glucose of 20, 18 respectively.  Given cancer could be hypercoaguable, clean echo 10/9  - MRI with small punctate scattered diffusion restriction  - CTA without any vascular findings  - hold ASA with low platelets on CBC  - continue rosuvastatin  - stroke labs: LDL 38, A1c 7.5, TSH 0.56    **Acute Symptomatic Seizure, probable  - right arm jerking, lip smacking  - history of old L sided lesions  - likely provoked in setting of hypoglycemia  - Per discussion with daughter patient does not have any history of seizures  - EEG negative, discontinue  - No seizures noted thus far in the ICU  - hold Keppra unless further concern for seizure    **Pain  - tylenol prn mild pain  - oxycodone prn moderate pain  - fentanyl prn severe pain      Cardiovascular:  Admission weight: 70.4Kg  Daily Weight: 70.4 kg (155 lb 3.3 oz)     Hemodynamic goals:  - MAP > 65  - SBP < 180    **Shock (POA)  Etiology likely related to sedation as patient was placed on propofol and deemed to be after intubation.  Patient does present with hypothermia and leukocytosis last septic shock is also a priority  -Requiring low-dose of norepinephrine to maintain maps >65, this improves once propofol was stopped  - ICU admission lactate was negative, leukocytosis resolved  -See ID for rest of plan  02/15/23 - ECHO LV systolic function normal, EF 60-65%. Right heart normal. No overt valvular abnormalities. No PFO.     **Sinus bradycardia  - improved after propofol stopped  - hypoglycemia and hypothermia can be contributing  - EKG shows sinus bradycardia with no AV block, troponin negative    Pulmonary    **Mechanical ventilation (POA)  - intubated since 03/07/23 for inability to protect airway  - SBT and evaluate for potential extubation once awake  - CXR without consolidation  - goal pO2 > 80  - goal pCO2 35-45  - VAP bundle    **tobacco abuse  - smoking since age 67  - place nicotine patch  - smoking cessation counseling      FEN  Body mass index is 24.65 kg/m??.    GI prophylaxis: H2 blocker (intubated)  Last BM Date: 03/07/23  Foley catheter: Foley required for accurate continuous I/O's.      **Nutrition  - NPO  - consult nutrition for tube feed recs  - awaiting nutrition recs    **Hypoglycemia, labile  - likely in the setting of pancreatic cancer, liver compromise  - maybe be responsible for  DWI lesions  - Glucose 60 on admission to ICU administered D50 amp, readings of 18 and 20 per report  - Hypoglycemia protocol in place      **Fluids/electrolytes  - goal euvolemia  - follow strict ins/outs  - check admission chemistry panel and supplement electrolytes as needed    **Alport syndrome  **ESRD  - diagnosed in 2009  - was on transplant list but removed given pancreatic CA  - patient discussed with nephrology team, accepted peritoneal dialysis  - nephrology consulted    **Hyperkalemia  - 6.2 to 5 coming down  - EKG without peaked T-wave worsening  - continue lokelma  - monitor closely and shift with insulin and dextrose if necessary    Heme/ID  Current DVT prophylaxis: SCDs, Heparin 5,000 units SQ    - start chemical DVT prophylaxis given high risk    **Pancreatic adenocarcinoma  Newly diagnosed with metastasis to liver (biopsy 02/27/23), coordinating with [facility]  - Evaluated at [facility] 02/15/2023  - treatment plan pending additional studies, staging  - still pending MRI pelvis for full staging  - consult heme/onc     **Hypothermia, leukocytosis  - likely related to hypoglycemia  - septic work up, pan cultures sent  - RVP pending  - Warming blanket placed  - MRSA swab sent, pending  - discontinue cefepime      Current Access:         -  PIV x 1       - Arterial line: Arterial line to be removed today.       - Central venous line: No central line present.    Tubes and drains:  Patient Lines/Drains/Airways Status       Active Active Lines, Drains, & Airways       Name Placement date Placement time Site Days    Peripheral IV 03/07/23 Anterior;Right Forearm 03/07/23  1956  Forearm  1    Peripheral IV Posterior;Right Wrist --  --  Wrist  --                           Objective Data     All vital signs and resulted laboratory studies for the past 24 hours have been reviewed.  All ordered medications have been reviewed.    Temp:  [36.2 ??C (97.2 ??F)-36.9 ??C (98.4 ??F)] 36.9 ??C (98.4 ??F)  Core Temp:  [36.9 ??C (98.4 ??F)] 36.9 ??C (98.4 ??F)  Heart Rate:  [63-81] 66  SpO2 Pulse:  [63-79] 66  Resp:  [9-19] 12  BP: (93-120)/(43-61) 111/61  MAP (mmHg):  [58-78] 78  A BP-1: (107-130)/(47-62) 128/59  MAP:  [65 mmHg-87 mmHg] 82 mmHg  A BP-2: (107-123)/(48-54) 117/54  MAP:  [68 mmHg-79 mmHg] 76 mmHg  SpO2:  [92 %-98 %] 97 %    Intake/Output Summary (Last 24 hours) at 03/09/2023 1102  Last data filed at 03/09/2023 1000  Gross per 24 hour   Intake 725 ml   Output 350 ml   Net 375 ml        Physical Exam          General Exam:  General: Lying in bed. Appears comfortable. No obvious distress. Intubated.     ENT:  Mucous membranes moist. Oropharynx clear. Endotracheal tube in place.  Cardiovascular:  Regular rate and rhythm.  No murmurs.   2+ radial pulses bilaterally.   Extremities warm and nonedematous.  Respiratory: Breathing is  comfortable and unlabored.  Intubated and mechanically ventilated. Lungs are clear to ausculation bilaterally.    Gastrointestinal: Soft, nontender, nondistended. bowel sounds normal  Extremities: Warm and well-perfused. No cyanosis, clubbing, or edema.  Skin: No obvious rashes or ecchymoses.    Neurological Exam:  Mental Status  LOC: arouses to tactile stimulation  Orientation: UTA  Speech: UTA    Cranial Nerves  Pupils: PERRL  Corneals: present bilaterally  Gaze: normal  Face Motor: UTA  Cough: strong  Tongue: midline    Motor: only intermittently follows commands in all four extremities  RUE: commands  LUE: commands  RLE: commands  LLE: commands     Sensory:    Sensory:  Grossly intact    Glasgow Coma Score  Motor: localizing to pain = 5  Verbal: intubated = 1T  Eyes: eyes open to pain = 2        DISPOSITION     The patient requires admission to the [facility] Unit for management of the above conditions.    Estimated Transfer Date:  10/24        BILLING     This patient is critically ill or injured with the impairment of vital organ systems such that there is a high probability of imminent or life threatening deterioration in the patient's condition. The reason the patient is critically ill and the nature of the treatment and management provided to manage the critically ill patient is as listed in above note.   I directly provided 35 minutes of critical care time as documented in this note. Time includes: direct patient care, patient reassessment, coordination of patient care, interpretation of data, review of patient medical records, and documentation of patient care. This time is exclusive of separately billable procedures.        Catalina Antigua, MD  Neurocritical Care

## 2023-03-09 NOTE — Unmapped (Signed)
Pt extubated this morning, per physician order, to [facility].  Pt has been weaned down and is currently on RA.  No signs of stridor were noted.  Will continue to monitor.

## 2023-03-09 NOTE — Unmapped (Signed)
Neurology Inpatient Team A [facility])  Daily Progress Note       Patient: Amy [name]  Code Status: Full Code  Level of Care: ICU status.   LOS: 2 days      Overnight Events & Subjective:     - Patient continues to have altered mental status  - Will consult oncology for recommendations for her prognosis and recent diagnosis of pancreatic adenocarcinoma  - Endocrinology following for diabetic management     Physical Exam:       General Exam:  General Appearance: Chronically ill appearing.  HEENT: Head is atraumatic and normocephalic. Sclera anicteric without injection. Oropharyngeal membranes are moist with no erythema or exudate. EEG leads on.   Neck: Supple.  Heart: WWP    Neurological Exam:  Mental Status: Awakens by tactile stimulation. Able to follow simple commands and and can speak in sentences with significant prompting. No dysarthria  Cranial Nerves: PERRL 4 mm. Face grossly symmetrical.   Motor Exam: Moving all extremities spontaneously and to command.    Reflexes: Toes downgoing.    Sensory: Reacts to noxious on LUE, BLE. RUE deferred due to agitation  Cerebellar/Coordination/Gait: Deferred due to mental status       Assessment/Plan:       Assessment: Khalil Greenberger is a 62 y.o. female with a past medical history of CKD5, metastatic pancreatic adenocarcinoma (liver mets),  who was admitted to [facility] on 03/07/2023 for recurrent embolic strokes and altered mental status.       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  Shortly thereafter had worsening altered mental status and therefore repeat CTA head was performed with no LVO.  Acute worsening likely due to seizures as described below.  Etiology for her recurrent multifocal strokes is most likely due to hypercoagulable state from her metastatic pancreatic cancer.  She also has tobacco use, diabetes, and hypertension which could be playing a role as well. While anticoagulation would be the treatment for malignancy, we will defer anticoagulation in the setting of further hematological workup.  Will continue Plavix when medically able as patient potentially may have had an allergy to aspirin.     - Non-contrasted CT scan of the head showed no acute abnormality  - A1c 7.5, LDL 69  - echo 10/2 normal. Repeat POCUS here showed trace pericardial effusion  - carotid doppler 02/10/23 showed left IC stenosis <50%  - tele shows sinus bradycardia     Plan:  - Consider transitioning patient to plavix when able due to potential allergy  - resume home rosuvastatin 10mg   - Will need smoking cessation counseling      #AMS:   #Concern for provoked seizures  Patient was waiting in the emergency department prior to discharge, when she began to have altered mental status.  Last known normal 4 PM, GCS 15 prior to this, but on reevaluation at 8 PM GCS was 7, patient was nonverbal, not opening eyes, moving right side briskly to noxious and left side weakly to noxious.  She was given 4 mg Ativan and 4.5 mg Keppra load. Per ED, sonorous respiration, having repetitive motion of her right upper extremity, lip smacking, and obtunded. She had multiple erratic glucoses while here, initially normal, then low at 18, immediate recheck 357.  Etiology of altered mental status most likely from acute symptomatic seizure from hypoglycemia.  However given the location of  the semiology and contrast-enhancing lesion in left hemisphere, seizure could also potentially be from a metastatic lesion.  We would favor continuing antiseizure medicines but currently on hold from concerns for sedation.    - s/p keppra 4.5g and ativan 4mg     #FEN  - Due to mental status, patient declining food  - Will monitor  - Receiving maintenance fluids    # diabetes:  # Hypoglycemia  Patient had erratic glucoses and at one time received 1 amp of dextrose while in ED and hypoglycemia possibly responsible for AMS/seizures. Unclear etiology as she did not receive any antihyperglycemics in ED, possibly related to pancreatic cancer.  Endocrinology felt this was iatrogenic as she received insulin prior to coming to ED. Negative cosyntropin stim test.   - Appreciate endocrinology recs  - continue SSI     #Metastatic pancreatic cancer with liver mets  First discovered 01/2023 with presentation for GI symptoms. Biopsy confirmed last week. It is unclear if there is brain involvement at this time. She has followup with hematology scheduled 03/15/2023.  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 Not on dialysis at this time. on home bicarb daily  - CTM     #Transaminitis: likely iso metastatic liver disease  - ctm    STABLE PROBLEMS:    # HTN:  - home lisinopril      # Pain:  - holding home lyrica     # HLD:  - home rosuvastatin      RESOLVED PROBLEMS:  # Intubation  - Patient required intubation for low GCS. Now extubated.       # Discharge Planning:   - Case management: pending.  - Social work: N/A  - PT: consulted, TBD  - OT: consulted, TBD  - SLP: consulted, TBD  - PM&R: N/A  - Expected Discharge Disposition: TBD.  Facility: TBD  - Follow-up appointments with TBD.     # Checklist:  - Diet: Strict NPO until patient passes bedside dysphagia screen or evaluation by speech-language pathology  - IV fluids: no  - Bowel Regimen: Miralax 17 g daily and Senna 2 tabs at night  - GI PPX: H2 blocker  - DVT PPX: Heparin 5000 units [place] q8h  - Lines/Access: PIV x1.    - Foley: No    This patient was seen and discussed with Dr. Darlina Sicilian, who agrees with the above assessment and plan.      This patient is co-managed with the NSICU Service. Under this co-management agreement, the NSICU serves as the first-call team. Please page the NSICU team at [phone] for any questions/concerns.  Neurology Team A resident can be reached at [phone].    Hermenia Fiscal, DO  PGY-2 Neurology Data Review:   Discussed in A/P

## 2023-03-09 NOTE — Unmapped (Signed)
Endocrine Team Diabetes Follow Up Consult Note     Consult information:  Requesting Attending Physician : Morene Antu, DO  Service Requesting Consult : Neurology (NEU)  Primary Care Provider: [name], MD  Impression:  Amy [name] is a 62 y.o. 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 We have been consulted at the request of [facility], DO to evaluate Amy [name] for hypoglycemia.     Medical Decision Making:  Diagnoses:  1.Type 2 Diabetes. Uncontrolled With hyperglycemia.  2. Nutrition: Complicating glycemic control. Increasing risk for both hypoglycemia and hyperglycemia.  3. Chronic Kidney Disease. Complicating glycemic control and increasing risk for hypoglycemia.      Studies reviewed 03/09/23:  Labs: CBC, BMP, and POCT-BG, cosyntropin stim test  Interpretation: Hyperglycemia. Renal dysfunction and hypernatremia. Cortisol stimulated appropriately.  Notes reviewed: Primary team      Overall impression based on above reviews and history:  No hypoglycemia in past 24 hours. Received 5 units of insulin last night due to hyperglycemia. Cosyntropin stim test negative for adrenal insufficiency. Hypoglycemia likely due to self-administered insulin with lack of PO intake in the setting of possible sepsis.    Recommendations:  Continue sliding scale insulin for now  If develops persistent hyperglycemia, then will consider restarting scheduled insulin  Hypoglycemia protocol.  POCT-BG achs.  Ensure patient is on glucose precautions if patient taking nutrition by mouth.     Discharge planning:  In process. Will complete closer to discharge.    Thank you for this consult. Discussed plan with primary team. We will continue to follow and make recommendations and place orders as appropriate.    Please page with questions or concerns: Endocrine Fellow: [phone]  Endocrinology Diabetes Care Team on call from 6AM - 3PM on weekdays then endocrine fellow on call: [phone] from 3PM - 6AM on weekdays and on weekends and holidays.   If APP cannot be reached, please page the endocrine fellow on call.      Subjective:  Initial HPI:  Amy [name] is a 63 y.o. 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 Of note, during a seizure event in the ED, she was found to be hypoglycemic to 18, necessitating dextrose. Since starting tube feeds, she has been more eu- to hyperglycemic. She is on short- and long-acting insulin at home, although the exact doses are unknown as patient not able to communicate this due to AMS. Her diabetes is managed by a provider at an outside network, per her daughter--unable to see this records in EMR. Patient's daughter believes she last took her insulin prior to presenting to the ED.     Diabetes History:  Patient has a history of Type 2 diabetes   Current home diabetes regimen: on glargine and novolog at home, unclear how many units.  Current home blood glucose monitoring: patient unable to answer.  Hypoglycemia awareness: patient unable to answer.  Complications related to diabetes: chronic kidney disease 2/2 Alport, but DM2 likely contributing       Current Nutrition:  Active Orders   Diet    Nutrition Therapy Consistent Carb; Consistent Carb 75/75/75 (5/5/5)       ROS: As per HPI.     atorvastatin  40 mg Oral Daily    cosyntropin  0.25 mg Intravenous Once    EPINEPHrine  heparin (porcine) for subcutaneous use  5,000 Units Subcutaneous Q8H SCH    insulin regular  0-20 Units Subcutaneous Q6H SCH    levETIRAcetam  500 mg Oral BID    midazolam (PF)        nicotine  1 patch Transdermal Daily    Followed by    Melene Muller ON 03/15/2023] nicotine  1 patch Transdermal Daily    polyethylene glycol  17 g Enteral tube: gastric Daily    senna  2 tablet Enteral tube: gastric Nightly    sodium zirconium cyclosilicate  10 g Oral BID       Current Outpatient Medications   Medication Instructions    aspirin (ECOTRIN) 81 mg, Oral, Daily (standard)    AURYXIA 210 mg, Oral, 3 times a day (with meals)    blood sugar diagnostic (TRUE METRIX GLUCOSE TEST STRIP) Strp True Metrix Glucose Test Strip    cholecalciferol (vitamin D3-25 mcg (1,000 unit)) 1,000 Units, Oral, Daily (standard)    clobetasol (TEMOVATE) 0.05 % external solution Topical, 2 times a day (standard), To scalp    clobetasol (TEMOVATE) 0.05 % ointment Apply to your thick psoriasis areas twice daily until clear    cyclobenzaprine (FLEXERIL) 10 mg, Oral, Nightly    lisinopril (PRINIVIL,ZESTRIL) 10 mg, Oral, Daily (standard)    nicotine (NICODERM CQ) 14 mg/24 hr patch 1 patch, Transdermal, Every 24 hours, Remove old patch before applying new patch on skin. After 8 weeks, switch to 7 mg patches.    nicotine (NICODERM CQ) 21 mg/24 hr patch 1 patch, Transdermal, Every 24 hours, Remove old patch before applying new patch on skin. After 8 weeks, switch to 14 mg patches.    nicotine (NICODERM CQ) 7 mg/24 hr patch 1 patch, Transdermal, Every 24 hours    NOVOLOG FLEXPEN U-100 INSULIN 100 unit/mL (3 mL) injection pen No dose, route, or frequency recorded.    oxyCODONE (ROXICODONE) 5 mg, Oral, Every 4 hours PRN    pen needle, diabetic 32 gauge x 5/32 (4 mm) Ndle Injection, 3 times a day (AC)    pregabalin (LYRICA) 25 MG capsule No dose, route, or frequency recorded.    rosuvastatin (CRESTOR) 10 mg, Oral, Daily (standard)    SEMGLEE,INSULIN GLARG-YFGN,PEN 100 unit/mL (3 mL) InPn     SYMBICORT 160-4.5 mcg/actuation inhaler No dose, route, or frequency recorded.    TRUEPLUS PEN NEEDLE 32 gauge x 5/32 Ndle No dose, route, or frequency recorded.    ustekinumab (STELARA) 45 mg/0.5 mL Syrg syringe Inject the contents of 1 syringe (45 mg) under the skin once every 12 weeks.           Past Medical History:   Diagnosis Date    Alport syndrome     Back pain     Chronic kidney disease     Dental abscess     Diabetes mellitus (CMS-HCC)     type 2, don't remember A1C, controlled with insulin injections; okay per physician at last visit    Hyperlipidemia     Hypertension     well controlled with meds    Kidney stone     no problems since 1995    Psoriasis        Past Surgical History:   Procedure Laterality Date    BREAST BIOPSY Right 05/30/2018    Intraductal papilloma w/ florid DH & apocrine metaplasia    CATARACT EXTRACTION Right 08/29/2017    COLONOSCOPY      cyst removal in breast  HYSTERECTOMY      INSERT ARTERIAL LINE  03/08/2023    NEPHROSTOMY      OOPHORECTOMY      ONLY ONE OVARY REMOVED    PR COLSC FLX W/RMVL OF TUMOR POLYP LESION SNARE TQ N/A 06/19/2018    Procedure: COLONOSCOPY FLEX; W/REMOV TUMOR/LES BY SNARE;  Surgeon: Jarvis Morgan, MD;  Location: [facility];  Service: Gastroenterology    PR XCAPSL CTRC RMVL INSJ IO LENS PROSTH W/O ECP Right 08/29/2017    Procedure: EXTRACAPSULAR CATARACT REMOVAL W/INSERTION OF INTRAOCULAR LENS PROSTHESIS, MANUAL OR MECHANICAL TECHNIQUE Lens Choice:OD ZCB00 +19.50 [id number] +19.00/+18.00;  Surgeon: O'Rese Bonney Roussel, MD;  Location: [facility] [address] [facility];  Service: Ophthalmology    PR XCAPSL CTRC RMVL INSJ IO LENS PROSTH W/O ECP Left 09/19/2017    Procedure: EXTRACAPSULAR CATARACT REMOVAL W/INSERTION OF INTRAOCULAR LENS PROSTHESIS, MANUAL OR MECHANICAL TECHNIQUE Lens Choice: ZCB00 +21.00; ZA9003 +20.00/+19.00;  Surgeon: O'Rese Bonney Roussel, MD;  Location: [facility];  Service: Ophthalmology       Family History   Problem Relation Age of Onset    Kidney disease Mother     Diabetes Mother     Cataracts Mother     Heart attack Father     No Known Problems Daughter     Cancer Paternal Grandmother     No Known Problems Other     Breast cancer Neg Hx     Colon cancer Neg Hx     Endometrial cancer Neg Hx     Ovarian cancer Neg Hx     Melanoma Neg Hx     Basal cell carcinoma Neg Hx     Squamous cell carcinoma Neg Hx     BRCA 1/2 Neg Hx        Social History     Tobacco Use    Smoking status: Former     Current packs/day: 0.00     Average packs/day: 1 pack/day for 43.0 years (43.0 ttl pk-yrs)     Types: Cigarettes     Start date: 02/12/1978     Quit date: 02/12/2021     Years since quitting: 2.0    Smokeless tobacco: Never   Vaping Use    Vaping status: Every Day   Substance Use Topics    Alcohol use: No    Drug use: No       OBJECTIVE:  BP 93/43  - Pulse 68  - Temp 36.5 ??C (97.7 ??F) (Oral)  - Resp 11  - Ht 169 cm (5' 6.54)  - Wt 70.4 kg (155 lb 3.3 oz)  - SpO2 97%  - BMI 24.65 kg/m??   Wt Readings from Last 12 Encounters:   03/07/23 70.4 kg (155 lb 3.3 oz)   02/15/23 69.9 kg (154 lb)   02/01/23 70.7 kg (155 lb 12.8 oz)   12/18/22 67.1 kg (148 lb)   10/26/22 70.3 kg (155 lb)   07/27/22 70.3 kg (155 lb)   04/27/22 70.4 kg (155 lb 4.8 oz)   02/09/22 72.9 kg (160 lb 11.2 oz)   12/15/21 75.8 kg (167 lb 3.2 oz)   09/15/21 75.5 kg (166 lb 8 oz)   07/21/21 75.1 kg (165 lb 9.6 oz)   05/26/21 76.9 kg (169 lb 8 oz)           BG/insulin reviewed per EMR.   Glucose, POC (mg/dL)   Date Value   16/02/9603 150   03/08/2023 313 (H)  03/08/2023 116   03/08/2023 118   03/08/2023 72   03/08/2023 75   03/08/2023 126   03/08/2023 131        Summary of labs:  Lab Results   Component Value Date    A1C 7.5 (H) 02/15/2023    A1C 7.3 (H) 02/09/2023    A1C 6.2 (H) 12/14/2021     Lab Results   Component Value Date    CREATININE 4.21 (H) 03/09/2023     Lab Results   Component Value Date    WBC 9.2 03/09/2023    HGB 11.3 03/09/2023    HCT 34.4 03/09/2023    PLT 71 (L) 03/09/2023       Lab Results   Component Value Date    NA 146 (H) 03/09/2023    K 4.4 03/09/2023    CL 119 (H) 03/09/2023    CO2 17.0 (L) 03/09/2023    BUN 67 (H) 03/09/2023    CREATININE 4.21 (H) 03/09/2023    GLU 199 (H) 03/09/2023    CALCIUM 7.9 (L) 03/09/2023    MG 2.4 03/09/2023    PHOS 6.2 (H) 03/09/2023       Lab Results   Component Value Date    BILITOT 0.6 03/07/2023    BILIDIR 0.30 03/07/2023    PROT 5.9 03/07/2023    ALBUMIN 3.0 (L) 03/08/2023    ALT 172 (H) 03/07/2023    AST 87 (H) 03/07/2023    ALKPHOS 396 (H) 03/07/2023    GGT 27 08/27/2020

## 2023-03-09 NOTE — Unmapped (Signed)
Neuro:  Q4. Alert and orient to person, hospital, situation, intermittently disoriented to time. Able to focus/track/conjugate. Pupils equal and reactive. +cough/+corneals/deferred gag. Following commands. 5/5 on right (upper and lower) with full sensation.  Ambulating with assistance.      Cardiac:  Sinus rhythm  SBP < 180  MAP > 65     Respiratory:  Weaned to room air       Heme:  SCDs, team monitoring labs     GI/GU:  Carb steady diet. Q6 acu.  Voiding in bedpan.  Monitoring I/0s- trial voids     Access  2 PIVs     Skin:  Q2 turns  No signs of skin breakdown     Pain:  Denies, found resting after interventions.               Problem: Adult Inpatient Plan of Care  Goal: Plan of Care Review  Outcome: Ongoing - Unchanged  Goal: Patient-Specific Goal (Individualized)  Outcome: Ongoing - Unchanged  Goal: Absence of Hospital-Acquired Illness or Injury  Outcome: Ongoing - Unchanged  Intervention: Identify and Manage Fall Risk  Recent Flowsheet Documentation  Taken 03/09/2023 0000 by Tiney Rouge, RN  Safety Interventions: bed alarm  Intervention: Prevent Skin Injury  Recent Flowsheet Documentation  Taken 03/09/2023 0400 by Tiney Rouge, RN  Positioning for Skin: Right  Device Skin Pressure Protection: absorbent pad utilized/changed  Skin Protection: adhesive use limited  Taken 03/09/2023 0200 by Tiney Rouge, RN  Positioning for Skin: Left  Device Skin Pressure Protection: absorbent pad utilized/changed  Skin Protection: adhesive use limited  Taken 03/09/2023 0000 by Tiney Rouge, RN  Positioning for Skin: Right  Device Skin Pressure Protection: absorbent pad utilized/changed  Skin Protection: adhesive use limited  Taken 03/08/2023 2200 by Tiney Rouge, RN  Positioning for Skin: Left  Device Skin Pressure Protection: absorbent pad utilized/changed  Skin Protection: adhesive use limited  Taken 03/08/2023 2000 by Tiney Rouge, RN  Positioning for Skin: Right  Device Skin Pressure Protection: absorbent pad utilized/changed  Skin Protection: adhesive use limited  Intervention: Prevent and Manage VTE (Venous Thromboembolism) Risk  Recent Flowsheet Documentation  Taken 03/09/2023 0400 by Tiney Rouge, RN  Anti-Embolism Device Type: SCD, Knee  Anti-Embolism Intervention: On  Anti-Embolism Device Location: BLE  Taken 03/09/2023 0200 by Tiney Rouge, RN  Anti-Embolism Device Type: SCD, Knee  Anti-Embolism Intervention: On  Anti-Embolism Device Location: BLE  Taken 03/09/2023 0000 by Tiney Rouge, RN  Anti-Embolism Device Type: SCD, Knee  Anti-Embolism Intervention: On  Anti-Embolism Device Location: BLE  Taken 03/08/2023 2200 by Tiney Rouge, RN  Anti-Embolism Device Type: SCD, Knee  Anti-Embolism Intervention: On  Anti-Embolism Device Location: BLE  Taken 03/08/2023 2000 by Tiney Rouge, RN  VTE Prevention/Management:   dorsiflexion/plantar flexion performed   anticoagulant therapy  Anti-Embolism Device Type: SCD, Knee  Anti-Embolism Intervention: On  Anti-Embolism Device Location: BLE  Goal: Optimal Comfort and Wellbeing  Outcome: Ongoing - Unchanged  Goal: Readiness for Transition of Care  Outcome: Ongoing - Unchanged  Goal: Rounds/Family Conference  Outcome: Ongoing - Unchanged     Problem: Skin Injury Risk Increased  Goal: Skin Health and Integrity  Outcome: Ongoing - Unchanged  Intervention: Optimize Skin Protection  Recent Flowsheet Documentation  Taken 03/09/2023 0400 by Tiney Rouge, RN  Pressure Reduction Techniques:   frequent weight shift encouraged   heels elevated off bed  Head of Bed (HOB) Positioning: HOB at 30-45 degrees  Pressure Reduction Devices: pressure-redistributing mattress utilized  Skin Protection: adhesive use limited  Taken 03/09/2023 0200 by Tiney Rouge, RN  Activity Management: bedrest  Pressure Reduction Techniques:   frequent weight shift encouraged   heels elevated off bed  Head of Bed (HOB) Positioning: HOB at 30-45 degrees  Pressure Reduction Devices: pressure-redistributing mattress utilized  Skin Protection: adhesive use limited  Taken 03/09/2023 0000 by Tiney Rouge, RN  Activity Management: bedrest  Pressure Reduction Techniques:   frequent weight shift encouraged   heels elevated off bed  Head of Bed (HOB) Positioning: HOB at 30-45 degrees  Pressure Reduction Devices: pressure-redistributing mattress utilized  Skin Protection: adhesive use limited  Taken 03/08/2023 2200 by Tiney Rouge, RN  Activity Management: bedrest  Pressure Reduction Techniques:   frequent weight shift encouraged   heels elevated off bed  Head of Bed (HOB) Positioning: HOB at 30-45 degrees  Pressure Reduction Devices: pressure-redistributing mattress utilized  Skin Protection: adhesive use limited  Taken 03/08/2023 2000 by Tiney Rouge, RN  Activity Management: bedrest  Pressure Reduction Techniques:   frequent weight shift encouraged   heels elevated off bed  Head of Bed (HOB) Positioning: HOB at 30-45 degrees  Pressure Reduction Devices: pressure-redistributing mattress utilized  Skin Protection: adhesive use limited     Problem: Self-Care Deficit  Goal: Improved Ability to Complete Activities of Daily Living  Outcome: Ongoing - Unchanged     Problem: Fall Injury Risk  Goal: Absence of Fall and Fall-Related Injury  Outcome: Ongoing - Unchanged  Intervention: Promote Injury-Free Environment  Recent Flowsheet Documentation  Taken 03/09/2023 0000 by Tiney Rouge, RN  Safety Interventions: bed alarm     Problem: Mechanical Ventilation Invasive  Goal: Absence of Device-Related Skin and Tissue Injury  Outcome: Ongoing - Unchanged  Intervention: Maintain Skin and Tissue Health  Recent Flowsheet Documentation  Taken 03/09/2023 0400 by Tiney Rouge, RN  Device Skin Pressure Protection: absorbent pad utilized/changed  Taken 03/09/2023 0200 by Tiney Rouge, RN  Device Skin Pressure Protection: absorbent pad utilized/changed  Taken 03/09/2023 0000 by Tiney Rouge, RN  Device Skin Pressure Protection: absorbent pad utilized/changed  Taken 03/08/2023 2200 by Tiney Rouge, RN  Device Skin Pressure Protection: absorbent pad utilized/changed  Taken 03/08/2023 2000 by Tiney Rouge, RN  Device Skin Pressure Protection: absorbent pad utilized/changed

## 2023-03-09 NOTE — Unmapped (Signed)
OCCUPATIONAL THERAPY  Evaluation (03/09/23 1200)    Patient Name:  Amy [name]       Medical Record Number: [id number]     Date of Birth: January 31, 1961  Sex: Female      Post-Discharge Occupational Therapy Recommendations: Low intensity, 5x weekly          Equipment Recommendation  OT DME Recommendations: Defer to post acute       OT Treatment Diagnosis:      Decreased functional cognition, impaired balance, activity tolerance, and generalized weakness impacting ADL performance    Assessment  Problem List: Impaired ADLs, Fall risk, Decreased activity tolerance, Decreased cognition, Impaired judgement, Decreased safety awareness, Decreased strength, Decreased endurance, Decreased mobility             Assessment: 62 y.o. female admitted to NSICU with a diagnosis of altered mental status. Consideration of pt's occupational profile, assessment review, level of clinical decision making involved, and intervention plan, indicates pt is currently a moderate complexity case, presenting to OT with the difficulties as stated above.  Recommend post-acute OT 5x/wk (low intensity, potential to progress to high with improved alertness) to maximize safety and functional independence.       Today's Interventions: Provided pt education re: role of OT, POC, bed mobility techniques, EOB balance, sit<>Stand 2x, standing balance, side-stepping toward HOB, reorientation, bedlevel toileting due to urgency    Activity Tolerance During Today's Session  Limited by Mental Status, Limited by fatigue    Plan  Planned Frequency of Treatment: Plan of Care Initiated: 03/09/23  1-2x per day Weekly Frequency: 3-4 days per week  Planned Treatment Duration: 03/30/23    Planned Interventions:  Education (Patient/Family/Caregiver), Self-Care/Home Training, Therapeutic Activity, Therapeutic Exercise, Home Exercise Program, Visual/Perceptual training, Neuromuscular Re-education, Cognitive Skills Development      GOALS:   Patient and Family Goals: none stated    Short Term:   SHORT GOAL #1: Pt will complete toilet t/f with supervision and LRAD   Time Frame : 2 weeks  SHORT GOAL #2: Pt will complete LB dressing with supervision and LRAD   Time Frame : 2 weeks  SHORT GOAL #3: Pt will complete grooming routine standing sinkside with supervision and LRAD   Time Frame : 2 weeks                   Long Term Goal #1: Pt will score 20/24 on the AMPAC  Time Frame: 4 weeks    Prognosis:  Good  Positive Indicators:  PLOF  Barriers to Discharge: Cognitive deficits, Decreased caregiver support, Gait instability, Functional strength deficits, Endurance deficits, Inability to safely perform ADLS, Impaired Balance    Subjective  Medical Updates Since Last Visit/Relevant PMH Affecting Clinical Decision Making:    Prior Functional Status Unknown, pt limited historian. Per SLP note: The patient's daughter, Morrie Sheldon, provided social history, including family hx of dementia and concern for chronic memory difficulty for patient (forgetting wallet and keys). Morrie Sheldon reports that the patient works full time as a Lawyer; is an Theme park manager; and is independent with IADLs at baseline.            Patient / Caregiver reports: I need to pee      Past Medical History:   Diagnosis Date    Alport syndrome     Back pain     Chronic kidney disease     Dental abscess     Diabetes mellitus (CMS-HCC)     type 2, don't remember A1C, controlled  with insulin injections; okay per physician at last visit    Hyperlipidemia     Hypertension     well controlled with meds    Kidney stone     no problems since 1995    Psoriasis     Social History     Tobacco Use    Smoking status: Former     Current packs/day: 0.00     Average packs/day: 1 pack/day for 43.0 years (43.0 ttl pk-yrs)     Types: Cigarettes     Start date: 02/12/1978     Quit date: 02/12/2021     Years since quitting: 2.0    Smokeless tobacco: Never   Substance Use Topics    Alcohol use: No      Past Surgical History:   Procedure Laterality Date BREAST BIOPSY Right 05/30/2018    Intraductal papilloma w/ florid DH & apocrine metaplasia    CATARACT EXTRACTION Right 08/29/2017    COLONOSCOPY      cyst removal in breast      HYSTERECTOMY      INSERT ARTERIAL LINE  03/08/2023    NEPHROSTOMY      OOPHORECTOMY      ONLY ONE OVARY REMOVED    PR COLSC FLX W/RMVL OF TUMOR POLYP LESION SNARE TQ N/A 06/19/2018    Procedure: COLONOSCOPY FLEX; W/REMOV TUMOR/LES BY SNARE;  Surgeon: Jarvis Morgan, MD;  Location: [facility];  Service: Gastroenterology    PR XCAPSL CTRC RMVL INSJ IO LENS PROSTH W/O ECP Right 08/29/2017    Procedure: EXTRACAPSULAR CATARACT REMOVAL W/INSERTION OF INTRAOCULAR LENS PROSTHESIS, MANUAL OR MECHANICAL TECHNIQUE Lens Choice:OD ZCB00 +19.50 [id number] +19.00/+18.00;  Surgeon: O'Rese Bonney Roussel, MD;  Location: [facility] OR [facility];  Service: Ophthalmology    PR XCAPSL CTRC RMVL INSJ IO LENS PROSTH W/O ECP Left 09/19/2017    Procedure: EXTRACAPSULAR CATARACT REMOVAL W/INSERTION OF INTRAOCULAR LENS PROSTHESIS, MANUAL OR MECHANICAL TECHNIQUE Lens Choice: ZCB00 +21.00; ZA9003 +20.00/+19.00;  Surgeon: O'Rese Bonney Roussel, MD;  Location: [facility] OR [facility];  Service: Ophthalmology    Family History   Problem Relation Age of Onset    Kidney disease Mother     Diabetes Mother     Cataracts Mother     Heart attack Father     No Known Problems Daughter     Cancer Paternal Grandmother     No Known Problems Other     Breast cancer Neg Hx     Colon cancer Neg Hx     Endometrial cancer Neg Hx     Ovarian cancer Neg Hx     Melanoma Neg Hx     Basal cell carcinoma Neg Hx     Squamous cell carcinoma Neg Hx     BRCA 1/2 Neg Hx         Sulfur-8, Glucophage [metformin], Aspirin, Codeine, and Morphine     Objective Findings  Precautions / Restrictions  Aspiration precautions, HOB elevated, Falls precautions       Weight Bearing  Non-applicable    Required Braces or Orthoses  Non-applicable    Communication Preference  Verbal       Pain  did not rate    Equipment / Environment  Vascular access (PIV, TLC, Port-a-cath, PICC), Telemetry, Arterial line    Living Situation  Living Environment:  (Unknown, pt limited historian)  Lives With: Alone  Home Living: Two level home, Tub/shower unit, Standard height toilet, Level entry, Able to Live on main  level with bedroom/bathroom  Equipment available at home:  (Unknown)     Cognition   Orientation Level:  Disoriented to place, Disoriented to time, Disoriented to situation   Arousal/Alertness:  Delayed responses to stimuli, Inconsistent responses to stimuli   Attention Span:  Difficulty attending to directions, Difficulty dividing attention   Memory:  Unable to assess   Following Commands:  Follows one step commands with repetition, Follows one step commands with increased time   Safety Judgment:  Decreased awareness of need for safety, Impulsivity   Awareness of Errors and Problem Solving:  Assistance required to identify errors made, Assistance required to generate solutions, Assistance required to implement solutions   Comments: pt minimally verbal during session, saying uh huh to most questions, increased command follow and alertness once sitting EOB    Vision / Hearing      Vision Comments: brief sustained eye opening during session, unable to formally assess            Hand Function:  Hand Function comments: weak grip to command to bilat hands  Hand Dominance: Unknown    ROM / Strength:  UE ROM/ Strength Comment: moving BUE against gravity  LE ROM/ Strength Comment: able to complete knee extension bilaterally at EOB, RLE with less extension against gravity than LLE, though assessment limited by mental status    Coordination:  Coordination comment: UTA, limited by mental status    Sensation:  Sensory/ Proprioception/ Stereognosis comments: UTA    Balance:  Static Sitting-Level of Assistance: Minimum assistance  Dynamic Sitting-Level of Assistance: Minimum assistance    Static Standing-Level of Assistance: (fluctuating min-mod A static standing balance, receptive to cues for postural corrections)  Dynamic Standing - Level of Assistance: Unable to assess    Functional Mobility  Transfers: Mod assist  Bed Mobility - Needs Assistance: Max assist  Ambulation: fluctuating min-mod A to side-step toward [facility]    ADLs  ADLs: Needs assistance with ADLs (limited by mental status)      Feeding: max A   Grooming: max A   Toileting: max A   LB dressing: max A   UB dressing: max A   Bathing: max A       Patient at end of session: All needs in reach, In bed, Alarm activated, Staff present, Lines intact (L wrist restraint secure; R unsecured with RN aware and present to address a-line)     Occupational Therapy Session Duration  OT Individual [mins]: 21       AM-PAC-Daily Activity  Lower Body Dressing assistance needs: A lot - Maximum/Moderate Assistance  Bathing assistance needs: A lot - Maximum/Moderate Assistance  Toileting assistance needs: A lot - Maximum/Moderate Assistance  Upper Body Dressing assistance needs: A lot - Maximum/Moderate Assistance  Personal Grooming assistance needs: A lot - Maximum/Moderate Assistance  Eating Meals assistance needs: A lot - Maximum/Moderate Assistance    Daily Activity Score: 12    Score (in points): % of Functional Impairment, Limitation, Restriction  6: 100% impaired, limited, restricted  7-8: At least 80%, but less than 100% impaired, limited restricted  9-13: At least 60%, but less than 80% impaired, limited restricted  14-19: At least 40%, but less than 60% impaired, limited restricted  20-22: At least 20%, but less than 40% impaired, limited restricted  23: At least 1%, but less than 20% impaired, limited restricted  24: 0% impaired, limited restricted      I attest that I have reviewed the above information.  Signed: Verlon Au  Zadie Rhine, OT  Filed 03/09/2023

## 2023-03-09 NOTE — Unmapped (Cosign Needed)
Oncology Consult Note    Requesting Attending Physician :  Morene Antu, DO  Service Requesting Consult : Neurology (NEU)  Reason for Consult: metastatic pancreatic cancer, admitted for new stroke, seizures, possible brain mets  Primary Oncologist: Dr. Rozell Searing    Assessment: Amy [name] is a 62 y.o. female with history of Alport syndrome, ESRD (removed from transplant list given pancreatic cancer), pancreatic cancer with known liver mets who was admitted for stroke, seizures, with concern for brain mets. Oncology was consulted for evaluation of metastatic pancreatic cancer.     Discussion:  -The 5-year survival for stage IV pancreatic cancer remains grim, approximately 12%.  -Given her stroke and seizure goals of care discussion and consideration of best supportive care/hospice are reasonable depending on her hospital course    Recommendations:   -Will continue to monitor for improvement and see how she progresses over the next few days (she was unable to participate in conversation today) with the hope she is able to regain agency  -Would consider Palliative care consult for supports, code status discussions  -We will update Dr. Rozell Searing.  -Anticoagulation/antiplatelet agents per primary team.    This patient has been staffed with Dr. Dorna Mai. These recommendations were discussed with the primary team.     Please contact the oncology consult fellow at [phone] with any further questions.      Philippa Sicks, MD PhD  Hematology and Oncology Fellow    -------------------------------------------------------------    HPI: Amy [name] is a 62 y.o. female and who is being seen at the request of Morene Antu, DO for evaluation of stroke, seizures, in the setting of metastatic pancreatic cancer. Presented with stroke and then had a seizure inpatient, currently post-ictal, unable to participate in conversation    Review of Systems: All positive and pertinent negatives are noted in the HPI; a 10 system review of systems was otherwise negative except as noted in HPI.    Oncologic History:  Hematology/Oncology History    No history exists.       Past Medical History:   Diagnosis Date    Alport syndrome     Back pain     Chronic kidney disease     Dental abscess     Diabetes mellitus (CMS-HCC)     type 2, don't remember A1C, controlled with insulin injections; okay per physician at last visit    Hyperlipidemia     Hypertension     well controlled with meds    Kidney stone     no problems since 1995    Psoriasis        Past Surgical History:   Procedure Laterality Date    BREAST BIOPSY Right 05/30/2018    Intraductal papilloma w/ florid DH & apocrine metaplasia    CATARACT EXTRACTION Right 08/29/2017    COLONOSCOPY      cyst removal in breast      HYSTERECTOMY      INSERT ARTERIAL LINE  03/08/2023    NEPHROSTOMY      OOPHORECTOMY      ONLY ONE OVARY REMOVED    PR COLSC FLX W/RMVL OF TUMOR POLYP LESION SNARE TQ N/A 06/19/2018    Procedure: COLONOSCOPY FLEX; W/REMOV TUMOR/LES BY SNARE;  Surgeon: Jarvis Morgan, MD;  Location: [facility];  Service: Gastroenterology    PR XCAPSL CTRC RMVL INSJ IO LENS PROSTH W/O ECP Right 08/29/2017    Procedure: EXTRACAPSULAR CATARACT REMOVAL W/INSERTION OF INTRAOCULAR LENS PROSTHESIS, MANUAL OR MECHANICAL TECHNIQUE  Lens Choice:OD ZCB00 +19.50 [id number] +19.00/+18.00;  Surgeon: O'Rese Bonney Roussel, MD;  Location: [facility] OR [facility];  Service: Ophthalmology    PR XCAPSL CTRC RMVL INSJ IO LENS PROSTH W/O ECP Left 09/19/2017    Procedure: EXTRACAPSULAR CATARACT REMOVAL W/INSERTION OF INTRAOCULAR LENS PROSTHESIS, MANUAL OR MECHANICAL TECHNIQUE Lens Choice: ZCB00 +21.00; ZA9003 +20.00/+19.00;  Surgeon: O'Rese Bonney Roussel, MD;  Location: [facility] OR [facility];  Service: Ophthalmology       Family History   Problem Relation Age of Onset    Kidney disease Mother     Diabetes Mother     Cataracts Mother     Heart attack Father     No Known Problems Daughter     Cancer Paternal Grandmother No Known Problems Other     Breast cancer Neg Hx     Colon cancer Neg Hx     Endometrial cancer Neg Hx     Ovarian cancer Neg Hx     Melanoma Neg Hx     Basal cell carcinoma Neg Hx     Squamous cell carcinoma Neg Hx     BRCA 1/2 Neg Hx         Social History     Socioeconomic History    Marital status: Single   Tobacco Use    Smoking status: Former     Current packs/day: 0.00     Average packs/day: 1 pack/day for 43.0 years (43.0 ttl pk-yrs)     Types: Cigarettes     Start date: 02/12/1978     Quit date: 02/12/2021     Years since quitting: 2.0    Smokeless tobacco: Never   Vaping Use    Vaping status: Every Day   Substance and Sexual Activity    Alcohol use: No    Drug use: No    Sexual activity: Not Currently   Other Topics Concern    Do you use sunscreen? No    Tanning bed use? Yes     Comment: USED IN PAST    Are you easily burned? No    Excessive sun exposure? No    Blistering sunburns? Yes     Social Determinants of Health     Financial Resource Strain: Low Risk  (03/03/2023)    Overall Financial Resource Strain (CARDIA)     Difficulty of Paying Living Expenses: Not hard at all   Food Insecurity: No Food Insecurity (03/03/2023)    Hunger Vital Sign     Worried About Running Out of Food in the Last Year: Never true     Ran Out of Food in the Last Year: Never true   Transportation Needs: No Transportation Needs (03/03/2023)    PRAPARE - Therapist, art (Medical): No     Lack of Transportation (Non-Medical): No   Physical Activity: Unknown (08/29/2017)    Exercise Vital Sign     Days of Exercise per Week: Patient declined     Minutes of Exercise per Session: Patient declined   Social Connections: Unknown (08/29/2017)    Social Connection and Isolation Panel [NHANES]     Frequency of Communication with Friends and Family: Patient declined     Frequency of Social Gatherings with Friends and Family: Patient declined     Attends Religious Services: Patient declined     Database administrator or Organizations: Patient declined     Attends Banker Meetings: Patient declined     Marital Status: Patient  declined       Social History     Social History Narrative    Not on file       Allergies: is allergic to sulfur-8, glucophage [metformin], aspirin, codeine, and morphine.    Medications:   Meds:   atorvastatin  40 mg Oral Daily    EPINEPHrine        heparin (porcine) for subcutaneous use  5,000 Units Subcutaneous Q8H SCH    insulin regular  0-20 Units Subcutaneous Q6H SCH    midazolam (PF)        nicotine  1 patch Transdermal Daily    Followed by    Melene Muller ON 03/15/2023] nicotine  1 patch Transdermal Daily    polyethylene glycol  17 g Enteral tube: gastric Daily    senna  2 tablet Enteral tube: gastric Nightly     Continuous Infusions:  PRN Meds:.acetaminophen, dextrose in water, EPINEPHrine, midazolam (PF), ondansetron    Objective:   Vitals: Temp:  [36.2 ??C (97.2 ??F)-36.9 ??C (98.4 ??F)] 36.9 ??C (98.4 ??F)  Heart Rate:  [62-81] 62  SpO2 Pulse:  [62-79] 62  Resp:  [10-19] 11  BP: (87-111)/(43-61) 87/43  MAP (mmHg):  [57-78] 57  A BP-1: (108-130)/(50-62) 128/59  MAP:  [65 mmHg-87 mmHg] 82 mmHg  A BP-2: (107-123)/(48-55) 114/53  MAP:  [68 mmHg-79 mmHg] 75 mmHg  SpO2:  [92 %-97 %] 96 %  I/O this shift:  In: 30 [Blood:30]  Out: 0     Physical Exam:  BP 87/43  - Pulse 62  - Temp 36.9 ??C (98.4 ??F) (Oral)  - Resp 11  - Ht 169 cm (5' 6.54)  - Wt 70.4 kg (155 lb 3.3 oz)  - SpO2 96%  - BMI 24.65 kg/m??    General appearance - alert, well appearing, and in no distress   Mental status - disoriented, unable to follow commands  Eyes - asleep    ECOG Performance Status: 4 - Completely disabled. Cannot carry on any selfcare. Totally confined to bed or chair    Test Results  Lab Results   Component Value Date    WBC 9.2 03/09/2023    HGB 11.3 03/09/2023    HCT 34.4 03/09/2023    PLT 71 (L) 03/09/2023     Lab Results   Component Value Date    NA 146 (H) 03/09/2023    K 4.4 03/09/2023    CL 119 (H) 03/09/2023    CO2 17.0 (L) 03/09/2023    BUN 67 (H) 03/09/2023    CREATININE 4.21 (H) 03/09/2023    GLU 199 (H) 03/09/2023    CALCIUM 7.9 (L) 03/09/2023    MG 2.4 03/09/2023    PHOS 6.2 (H) 03/09/2023     Lab Results   Component Value Date    BILITOT 0.6 03/07/2023    BILIDIR 0.30 03/07/2023    PROT 5.9 03/07/2023    ALBUMIN 3.0 (L) 03/08/2023    ALT 172 (H) 03/07/2023    AST 87 (H) 03/07/2023    ALKPHOS 396 (H) 03/07/2023    GGT 27 08/27/2020     Lab Results   Component Value Date    INR 0.98 03/07/2023    APTT 25.2 03/07/2023       Imaging: Radiology studies were personally reviewed      XR Abdomen 1 View    Result Date: 03/08/2023  Esophagogastric tube with tip and sideport projecting over the stomach.     CTA Head W Contrast  Result Date: 03/07/2023  No large vessel occlusion or significant stenosis.     CTA Neck W Contrast    Result Date: 03/07/2023  No evidence of significant stenosis, dissection or aneurysm of the carotid or vertebral arteries.     XR Chest Portable    Result Date: 03/07/2023  Endotracheal tube terminates 4 cm above the carina.    MRI Brain W Wo Contrast    Result Date: 03/07/2023  -Numerous punctate areas of restricted diffusion involving multiple vascular territories, suspicious for acute embolic type infarcts. -Evolving right occipital infarct. Many of the previously seen punctate infarcts are less conspicuous. -Foci of enhancement involving the left postcentral gyrus without definitive associated restricted diffusion. Etiology is indeterminate, but recommend follow-up MR in 6 to 8 weeks to exclude underlying metastatic lesion. ++++++++++++++++++++ The findings of this study were discussed via epic messenger, receipt confirmed with DR. Ulla Gallo Migliaccio by Dr. Lucia Estelle on 03/07/2023 4:55 PM. -----------------------------------------------     XR Chest 1 view Portable    Result Date: 03/07/2023  Negative chest.    IR Biopsy Liver Percutaneous    Result Date: 02/27/2023  Ultrasound-guided biopsy of mass in the right hepatic lobe. Plan: Specimen(s) sent for evaluation.     CT Chest Wo Contrast    Result Date: 02/22/2023  -Upper lobe predominant centrilobular groundglass nodules bilaterally, may represent underlying respiratory bronchiolitis. -Multiple new scattered subcentimeter pulmonary and fissural nodules measuring up to 0.8 cm; indeterminate. Recommendation follow-up CT in 3 months for further assessment.    Mammo Digital Diagnostic Tomo Bilateral    Result Date: 02/16/2023  No mammographic or sonographic findings of malignancy in either breast ASSESSMENT: BI-RADS Category: 2-Mammo1Yr : Benign.  Annual mammography is recommended. Recommendation Laterality: Both Annual routine screening mammogram is recommended. The findings and recommendation were discussed in full with the patient.     US Breast Limited Right    Result Date: 02/16/2023  No mammographic or sonographic findings of malignancy in either breast ASSESSMENT: BI-RADS Category: 2-Mammo1Yr : Benign.  Annual mammography is recommended. Recommendation Laterality: Both Annual routine screening mammogram is recommended. The findings and recommendation were discussed in full with the patient.     MRI Abdomen W Wo Contrast MRCP    Result Date: 02/10/2023  2.9 cm hypoenhancing mass arising from the pancreatic neck/body junction with imaging features most suggestive of pancreatic adenocarcinoma. Mild upstream pancreatic ductal dilation. Likely narrowing of the adjacent splenic artery and vein Numerous enhancing hepatic metastases the largest of which measures 3.2 cm in segment 4/8. The findings of this study were discussed via telephone with DR. Cleone Slim  by Dr. Heloise Ochoa on 02/10/2023 6:19 PM. ----------------------------------------------- ==================== MODIFIED REPORT: (02/10/2023 10:57 PM) This report has been modified from its preliminary version; you may check the prior versions of radiology report, results history link for prior report versions (if they were previously visible in Epic). -----------------------------------------------    XR Chest 2 views    Result Date: 02/10/2023  No radiographic evidence of acute cardiopulmonary pathology.     MRI Brain W Wo Contrast    Result Date: 02/10/2023  Evaluation limited by motion artifact. Within this limitation, Findings concerning for intracranial metastatic disease, most prominently in the right occipital lobe. The findings of this study were discussed via telephone with DR. Cleone Slim by Dr. Heloise Ochoa on 02/10/2023 6:18 PM. ==================== ADDENDUM: (02/10/2023 7:22 PM) On review, the following additional findings were noted: Findings more worrisome for subacute infarct with a likely  embolic source given distribution. -----------------------------------------------     [place] RUQ W Gallbladder    Result Date: 02/10/2023  Hypoechoic irregular mass within the region of the pancreatic body with numerous lesions in the liver. This is suspicious for pancreatic neoplasm with hepatic metastases. Recommend confirmation with contrast-enhanced abdominal MRI.  ++++++++++++++++++++ The findings of this study were discussed via secure chat with DR. Sundra Aland by Dr. [place] Taylor on 02/10/2023 10:05 AM. -----------------------------------------------     XR Chest 2 views    Result Date: 02/09/2023  No acute cardiopulmonary abnormalities.

## 2023-03-09 NOTE — Unmapped (Signed)
PHYSICAL THERAPY  Evaluation (03/09/23 0815)          Patient Name:  Amy [name]       Medical Record Number: [id number]   Date of Birth: 05/04/1961  Sex: Female        Post-Discharge Physical Therapy Recommendations:  PT Post Acute Discharge Recommendations: 5x weekly, Low intensity   Equipment Recommendation  PT DME Recommendations: Defer to post acute             Treatment Diagnosis: Impaired mobility and activity tolerance s/p recurrent embolic strokes and altered mental status     Activity Tolerance: Limited by fatigue, Other (lethargy)     ASSESSMENT  Problem List: Impaired judgement, Fall risk, Gait deviation, Decreased mobility, Decreased strength, Impaired balance, Decreased endurance, Decreased cognition      Assessment : Amy [name] is a 62 y.o. female referred to physical therapy with a past medical history of CKD5, metastatic pancreatic adenocarcinoma (liver mets),  who was admitted to [facility] on 03/07/2023 for recurrent embolic strokes and altered mental status. She presents with deficits in functional mobility, strength, and activity tolerance. She is very limited by lethargy and fatigue, requiring heavy cueing and mod A for transfers and participation; requires manual assistance for initiation of all mobility. She will benefit from skilled PT in the acute setting, as well as post-acute PT of 5x/wk (low intensity) to address her deficits, although may progress to 3x with increased participation/alertness.      Today's Interventions: Balance activities, Endurance activities, Gait training, Patient/Family/Caregiver Education, Therapeutic activity  Today's Interventions: Bed mobility, transfers, gait, sitting/standing balance/tolerance, pt education on: PT role and POC, fall risks, activity pacing, up with assist, progressive mobility, taking rest breaks, continued UE/LE ROM/exercises, encouragement for participation in therapy/mobility                 Clinical Decision Making: High        PLAN  Planned Frequency of Treatment: Plan of Care Initiated: 03/09/23  1-2x per day Weekly Frequency: 3-4 days per week  Planned Treatment Duration: 03/22/23     Planned Interventions: Education (Patient/Family/Caregiver), Gait training, Home exercise program, Neuromuscular re-education, Self-care / Home Management training     Goals:   Patient and Family Goals: None stated     SHORT GOAL #1: The patient will safely complete all functional transfers with LRAD and mod I               Time Frame : 2 weeks  SHORT GOAL #2: The patient will safely ambulate 300 feet with LRAD and mod I              Time Frame : 2 weeks                                                             Long Term Goal #1: The patient will safely ambulate >/=500 feet with LRAD/no AD and mod I  Time Frame: 4 weeks     Prognosis:  Good  Positive Indicators: Reported PLOF, participation  Barriers to Discharge: Cognitive deficits, Decreased caregiver support, Gait instability, Functional strength deficits, Endurance deficits     SUBJECTIVE  Communication Preference: Verbal, Visual,     Patient reports: Uh-huh (repeating uh-huh throughout session, very lethargic and minimally verbal however can answer  some simple questions with repetition)        Prior Functional Status: May need to clarify d/t pt very lethargic during PT session and frequently falling asleep. Pt reports independence with ADLs and mobility at baseline, ambulates without an AD. 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 Denies any recent falls. Doesn't drive.  Equipment available at home: None        Past Medical History:   Diagnosis Date    Alport syndrome     Back pain     Chronic kidney disease     Dental abscess     Diabetes mellitus (CMS-HCC)     type 2, don't remember A1C, controlled with insulin injections; okay per physician at last visit    Hyperlipidemia     Hypertension     well controlled with meds    Kidney stone     no problems since 1995    Psoriasis             Social History     Tobacco Use    Smoking status: Former     Current packs/day: 0.00     Average packs/day: 1 pack/day for 43.0 years (43.0 ttl pk-yrs)     Types: Cigarettes     Start date: 02/12/1978     Quit date: 02/12/2021     Years since quitting: 2.0    Smokeless tobacco: Never   Substance Use Topics    Alcohol use: No       Past Surgical History:   Procedure Laterality Date    BREAST BIOPSY Right 05/30/2018    Intraductal papilloma w/ florid DH & apocrine metaplasia    CATARACT EXTRACTION Right 08/29/2017    COLONOSCOPY      cyst removal in breast      HYSTERECTOMY      INSERT ARTERIAL LINE  03/08/2023    NEPHROSTOMY      OOPHORECTOMY      ONLY ONE OVARY REMOVED    PR COLSC FLX W/RMVL OF TUMOR POLYP LESION SNARE TQ N/A 06/19/2018    Procedure: COLONOSCOPY FLEX; W/REMOV TUMOR/LES BY SNARE;  Surgeon: Jarvis Morgan, MD;  Location: [facility];  Service: Gastroenterology    PR XCAPSL CTRC RMVL INSJ IO LENS PROSTH W/O ECP Right 08/29/2017    Procedure: EXTRACAPSULAR CATARACT REMOVAL W/INSERTION OF INTRAOCULAR LENS PROSTHESIS, MANUAL OR MECHANICAL TECHNIQUE Lens Choice:OD ZCB00 +19.50 [id number] +19.00/+18.00;  Surgeon: O'Rese Bonney Roussel, MD;  Location: [facility] OR [facility];  Service: Ophthalmology    PR XCAPSL CTRC RMVL INSJ IO LENS PROSTH W/O ECP Left 09/19/2017    Procedure: EXTRACAPSULAR CATARACT REMOVAL W/INSERTION OF INTRAOCULAR LENS PROSTHESIS, MANUAL OR MECHANICAL TECHNIQUE Lens Choice: ZCB00 +21.00; ZA9003 +20.00/+19.00;  Surgeon: O'Rese Bonney Roussel, MD;  Location: [facility] OR [facility]  Service: Ophthalmology             Family History   Problem Relation Age of Onset    Kidney disease Mother     Diabetes Mother     Cataracts Mother     Heart attack Father     No Known Problems Daughter     Cancer Paternal Grandmother     No Known Problems Other     Breast cancer Neg Hx     Colon cancer Neg Hx     Endometrial cancer Neg Hx Ovarian cancer Neg Hx     Melanoma Neg  Hx     Basal cell carcinoma Neg Hx     Squamous cell carcinoma Neg Hx     BRCA 1/2 Neg Hx         Allergies: Sulfur-8, Glucophage [metformin], Aspirin, Codeine, and Morphine                  Objective Findings  Precautions / Restrictions  Precautions: Aspiration precautions, HOB elevated, Falls precautions  Weight Bearing Status: Non-applicable  Required Braces or Orthoses: Non-applicable     Pain Comments: Denies pain  Medical Tests / Procedures: Imaging and labs reviewed  Equipment / Environment: Vascular access (PIV, TLC, Port-a-cath, PICC), Telemetry, Arterial line     Vitals/Orthostatics : BP 116/51, HR 73, SpO2 96% RA; pt reports mild dizziness with sitting EOB BP 123/61, end BP 109/59, HR 69, SpO2 97% RA     Living Situation  Living Environment: House (Will need to confirm)  Lives With: Alone  Home Living: Two level home, Tub/shower unit, Standard height toilet, Level entry, Able to Live on main level with bedroom/bathroom  Caregiver Identified?: No      Cognition: Inconsistently follows commands  Cognition comment: A&O to person and place, reports year is 2014 and when asked year she says 25 - unable to answer even with options. Very lethargic but can intermittently follow simple 1-step comamnds with heavy repetition.  Orientation: Disoriented to situation, Disoriented to time  Visual/Perception: Wears Glasses/Contacts (unable to assess vision d/t pt preferring to keep eyes closed throughout session, only able to open for ~2 seconds max)  Hearing: No deficit identified     Skin Inspection: Intact where visualized     Upper Extremities  UE ROM: Right WFL, Left WFL  UE Strength: Right WFL, Left WFL    Lower Extremities  LE ROM: Right WFL, Left WFL  LE Strength: Right Impaired/Limited, Left Impaired/Limited  RLE Strength Impairment: Reduced strength  LLE Strength Impairment: Reduced strength  LE comment: Grossly at least 3/5 bilaterally, no buckling with weightbearing but does require assist for standing d/t poor initiation, unable to assess MMT d/t limited participation/lethargy          Motor/Sensory/Neuro Comments: Unable to assess pt's sensation d/t pt lethargy    Static Sitting-Level of Assistance: Standby assistance, Contact guard  Dynamic Sitting-Level of Assistance: Standby assistance, Contact guard  Sitting Balance comments: Sitting EOB with CGA progressing to SBA with encouragement to remain sitting upright    Static Standing-Level of Assistance: Minimum assistance  Dynamic Standing - Level of Assistance: Minimum assistance  Standing Balance comments: Standing with min A and HHA      Bed Mobility: Supine to Sit  Supine to Sit assistance level: Moderate assist, patient does 50-74%  Bed Mobility comments: Transferred supine <-> sit with HOB elevated with mod A with cues for sequencing, requiring extra time. Mostly requiring assistance for initiation of transfers. Scooting to [facility] with max A, rolling with min A.     Transfers: Sit to Stand  Sit to Stand assistance level: Moderate assist, patient does 50-74%  Transfer comments: Sit <-> stand with mod A with cues for sequencing, pt requiring increased time to stand and manual assist and cueing for initiation of transfer.      Gait Level of Assistance: Minimal assist, patient does 75% or more  Gait Assistive Device: Other (Comment) (HHA)  Gait Distance Ambulated (ft): 3 ft  Skilled Treatment Performed: Ambulated ~3 feet laterally to [facility] with min A with cues for sequencing, requiring increased  time. Pt demos slow, mildly unsteady gait with shuffling steps however no gross LOB. Limited by lethargy and fatigue.     Stairs: NT            Endurance: Limited by lethargy, fatigue    Patient at end of session: All needs in reach, In bed, Alarm activated, Lines intact, Nurse notified    Physical Therapy Session Duration  PT Individual [mins]: 27          AM-PAC-6 click  Help currently need turning over In bed?: A Little - Minimal/Contact Guard Assist/Supervision  Help currently needed sitting down/standing up from chair with arms? : A lot - Maximum/Moderate Assistance  Help currently needed moving from supine to sitting on edge of bed?: A lot - Maximum/Moderate Assistance  Help currently needed moving to and from bed from wheelchair?: A lot - Maximum/Moderate Assistance  Help currently needed walking in a hospital room?: A Little - Minimal/Contact Guard Assist/Supervision  Help currently needed climbing 3-5 steps with railing?: Unable to do/total assistance - Total Dependent Assist    Basic Mobility Score 6 click: 13    6 click Score (in points): % of Functional Impairment, Limitation, Restriction  6: 100% impaired, limited, restricted  7-8: At least 80%, but less than 100% impaired, limited restricted  9-13: At least 60%, but less than 80% impaired, limited restricted  14-19: At least 40%, but less than 60% impaired, limited restricted  20-22: At least 20%, but less than 40% impaired, limited restricted  23: At least 1%, but less than 20% impaired, limited restricted  24: 0% impaired, limited restricted        I attest that I have reviewed the above information.  Signed: Viviana Simpler, PT  Filed 03/09/2023

## 2023-03-09 NOTE — Unmapped (Signed)
Neuro: Pt increasingly somnolent, providers alerted throughout the day. Needs copious stimulation to respond to commands, answer orientation questions, and open eyes. Opens eyes to voice, non-productive cough. PERRL 3mm. BUE and BLE overcome gravity, not resistance.  A&O to person only.    Cardiac: NSR. SBP goal <180 and MAP >65. LR bolus given for low MAP.    Heme: Pt refuses SCDs. Subcutaneous heparin. Afebrile.    Respiratory: RA    GI: Carb count diet 75/75/75. Pt has not eaten today. Last BM pta.    GU: Voids with bedpan, attempted to void x2 with no success. Bladder scan showed >663 mL, I&O cath produced 900 mL amber urine. Q6 blood sugar check.    See flowsheets for more details.    Problem: Adult Inpatient Plan of Care  Goal: Plan of Care Review  Outcome: Ongoing - Unchanged  Goal: Patient-Specific Goal (Individualized)  Outcome: Ongoing - Unchanged  Goal: Absence of Hospital-Acquired Illness or Injury  Outcome: Ongoing - Unchanged  Intervention: Identify and Manage Fall Risk  Recent Flowsheet Documentation  Taken 03/09/2023 0800 by Willaim Rayas, RN  Safety Interventions:   bed alarm   enteral feeding safety   fall reduction program maintained   low bed   nonskid shoes/slippers when out of bed   seizure precautions  Intervention: Prevent Skin Injury  Recent Flowsheet Documentation  Taken 03/09/2023 1600 by Willaim Rayas, RN  Positioning for Skin: Left  Taken 03/09/2023 1400 by Willaim Rayas, RN  Positioning for Skin: Left  Taken 03/09/2023 1200 by Willaim Rayas, RN  Positioning for Skin: Right  Taken 03/09/2023 1000 by Willaim Rayas, RN  Positioning for Skin: Left  Taken 03/09/2023 0800 by Willaim Rayas, RN  Positioning for Skin: Right  Device Skin Pressure Protection:   absorbent pad utilized/changed   tubing/devices free from skin contact  Skin Protection:   adhesive use limited   incontinence pads utilized   tubing/devices free from skin contact  Intervention: Prevent and Manage VTE (Venous Thromboembolism) Risk  Recent Flowsheet Documentation  Taken 03/09/2023 1600 by Willaim Rayas, RN  Anti-Embolism Device Type: SCD, Knee  Anti-Embolism Intervention: On  Anti-Embolism Device Location: BLE  Taken 03/09/2023 1400 by Willaim Rayas, RN  Anti-Embolism Device Type: SCD, Knee  Anti-Embolism Intervention: On  Anti-Embolism Device Location: BLE  Taken 03/09/2023 1200 by Willaim Rayas, RN  Anti-Embolism Device Type: SCD, Knee  Anti-Embolism Intervention: On  Anti-Embolism Device Location: BLE  Taken 03/09/2023 1000 by Willaim Rayas, RN  Anti-Embolism Device Type: SCD, Knee  Anti-Embolism Intervention: On  Anti-Embolism Device Location: BLE  Taken 03/09/2023 0800 by Willaim Rayas, RN  VTE Prevention/Management:   dorsiflexion/plantar flexion performed   anticoagulant therapy  Anti-Embolism Device Type: SCD, Knee  Anti-Embolism Intervention: On  Anti-Embolism Device Location: BLE  Goal: Optimal Comfort and Wellbeing  Outcome: Ongoing - Unchanged  Goal: Readiness for Transition of Care  Outcome: Ongoing - Unchanged  Goal: Rounds/Family Conference  Outcome: Ongoing - Unchanged     Problem: Skin Injury Risk Increased  Goal: Skin Health and Integrity  Outcome: Ongoing - Unchanged  Intervention: Optimize Skin Protection  Recent Flowsheet Documentation  Taken 03/09/2023 1600 by Willaim Rayas, RN  Activity Management: bedrest  Head of Bed [facility]) Positioning: HOB at 30-45 degrees  Taken 03/09/2023 1400 by Willaim Rayas, RN  Activity Management: bedrest  Head of Bed [employer] [facility]) Positioning: HOB at 30-45 degrees  Taken 03/09/2023 1200 by Merlinda Frederick,  Madilyn Fireman, RN  Activity Management: bedrest  Head of Bed [facility]) Positioning: HOB at 30-45 degrees  Taken 03/09/2023 1000 by Willaim Rayas, RN  Activity Management: bedrest  Head of Bed [facility]) Positioning: HOB at 30-45 degrees  Taken 03/09/2023 0800 by Willaim Rayas, RN  Activity Management: bedrest  Pressure Reduction Techniques:   frequent weight shift encouraged   heels elevated off bed   weight shift assistance provided  Head of Bed (HOB) Positioning: HOB at 30-45 degrees  Pressure Reduction Devices:   heel offloading device utilized   pressure-redistributing mattress utilized  Skin Protection:   adhesive use limited   incontinence pads utilized   tubing/devices free from skin contact     Problem: Self-Care Deficit  Goal: Improved Ability to Complete Activities of Daily Living  Outcome: Ongoing - Unchanged     Problem: Fall Injury Risk  Goal: Absence of Fall and Fall-Related Injury  Outcome: Ongoing - Unchanged  Intervention: Promote Injury-Free Environment  Recent Flowsheet Documentation  Taken 03/09/2023 0800 by Willaim Rayas, RN  Safety Interventions:   bed alarm   enteral feeding safety   fall reduction program maintained   low bed   nonskid shoes/slippers when out of bed   seizure precautions     Problem: Mechanical Ventilation Invasive  Goal: Absence of Device-Related Skin and Tissue Injury  Outcome: Ongoing - Unchanged  Intervention: Maintain Skin and Tissue Health  Recent Flowsheet Documentation  Taken 03/09/2023 0800 by Willaim Rayas, RN  Device Skin Pressure Protection:   absorbent pad utilized/changed   tubing/devices free from skin contact

## 2023-03-09 NOTE — Unmapped (Signed)
Neuro:  Alert and orient to person, hospital, situation, intermittently disoriented to time. Able to focus/track/conjugate. Pupils equal and reactive. +cough/+corneals/deferred gag. Following commands. 5/5 on right (upper and lower) with full sensation.  Ambulating with assistance.      Cardiac:  Sinus rhythm  SBP < 180  MAP > 65     Respiratory:  Weaned to room air       Heme:  SCDs, team monitoring labs     GI/GU:  Advanced to Reg Diet    Monitoring I/0s- trial voids    Access  2 PIVs     Skin:  Q2 turns  No signs of skin breakdown     Pain:  Denies, found resting after interventions.       Problem: Adult Inpatient Plan of Care  Goal: Plan of Care Review  03/08/2023 1752 by Parris Signer, Harlow Ohms, RN  Outcome: Progressing  03/08/2023 1752 by Priscella Mann, RN  Outcome: Ongoing - Unchanged  Goal: Patient-Specific Goal (Individualized)  03/08/2023 1752 by Priscella Mann, RN  Outcome: Progressing  03/08/2023 1752 by Priscella Mann, RN  Outcome: Ongoing - Unchanged  Goal: Absence of Hospital-Acquired Illness or Injury  03/08/2023 1752 by Kerisha Goughnour, Harlow Ohms, RN  Outcome: Progressing  03/08/2023 1752 by Priscella Mann, RN  Outcome: Ongoing - Unchanged  Intervention: Identify and Manage Fall Risk  Recent Flowsheet Documentation  Taken 03/08/2023 0800 by Elizardo Chilson, Harlow Ohms, RN  Safety Interventions:   seizure precautions   aspiration precautions   room near unit station   bed alarm  Intervention: Prevent Skin Injury  Recent Flowsheet Documentation  Taken 03/08/2023 1600 by Jivan Symanski, Harlow Ohms, RN  Positioning for Skin: Left  Taken 03/08/2023 1400 by Tala Eber, Harlow Ohms, RN  Positioning for Skin: Right  Taken 03/08/2023 1200 by Adali Pennings, Harlow Ohms, RN  Positioning for Skin: Left  Skin Protection: tubing/devices free from skin contact  Taken 03/08/2023 1000 by Osias Resnick, Harlow Ohms, RN  Positioning for Skin: Right  Taken 03/08/2023 0800 by Kenyatta Gloeckner, Harlow Ohms, RN  Positioning for Skin: Supine/Back  Device Skin Pressure Protection: absorbent pad utilized/changed  Skin Protection: zinc oxide barrier cream  Intervention: Prevent and Manage VTE (Venous Thromboembolism) Risk  Recent Flowsheet Documentation  Taken 03/08/2023 1600 by Dandra Velardi, Harlow Ohms, RN  VTE Prevention/Management:   dorsiflexion/plantar flexion performed   anticoagulant therapy  Anti-Embolism Device Type: SCD, Knee  Anti-Embolism Intervention: On  Anti-Embolism Device Location: BLE  Taken 03/08/2023 1400 by Renton Berkley, Harlow Ohms, RN  Anti-Embolism Device Type: SCD, Knee  Anti-Embolism Intervention: On  Anti-Embolism Device Location: BLE  Taken 03/08/2023 1200 by Renette Hsu, Harlow Ohms, RN  VTE Prevention/Management: dorsiflexion/plantar flexion performed  Anti-Embolism Device Type: SCD, Knee  Anti-Embolism Intervention: On  Anti-Embolism Device Location: BLE  Taken 03/08/2023 1000 by Clyda Smyth, Harlow Ohms, RN  Anti-Embolism Device Type: SCD, Knee  Anti-Embolism Intervention: On  Anti-Embolism Device Location: BLE  Taken 03/08/2023 0800 by Audrea Bolte, Harlow Ohms, RN  VTE Prevention/Management: dorsiflexion/plantar flexion performed  Anti-Embolism Device Type: SCD, Knee  Anti-Embolism Intervention: On  Anti-Embolism Device Location: BLE  Goal: Optimal Comfort and Wellbeing  03/08/2023 1752 by Priscella Mann, RN  Outcome: Progressing  03/08/2023 1752 by Priscella Mann, RN  Outcome: Ongoing - Unchanged  Goal: Readiness for Transition of Care  03/08/2023 1752 by Priscella Mann, RN  Outcome: Progressing  03/08/2023 1752 by Priscella Mann, RN  Outcome: Ongoing - Unchanged  Goal: Rounds/Family Conference  03/08/2023 1752 by Priscella Mann,  RN  Outcome: Progressing  03/08/2023 1752 by Priscella Mann, RN  Outcome: Ongoing - Unchanged     Problem: Skin Injury Risk Increased  Goal: Skin Health and Integrity  03/08/2023 1752 by Priscella Mann, RN  Outcome: Progressing  03/08/2023 1752 by Priscella Mann, RN  Outcome: Ongoing - Unchanged  Intervention: Optimize Skin Protection  Recent Flowsheet Documentation  Taken 03/08/2023 1600 by Callaway Hardigree, Harlow Ohms, RN  Head of Bed [facility]) Positioning: HOB at 30 degrees  Taken 03/08/2023 1400 by Esthela Brandner, Harlow Ohms, RN  Head of Bed [facility]) Positioning: HOB at 30 degrees  Taken 03/08/2023 1200 by Doloras Tellado, Harlow Ohms, RN  Pressure Reduction Techniques: frequent weight shift encouraged  Head of Bed (HOB) Positioning: HOB at 30-45 degrees  Pressure Reduction Devices: pressure-redistributing mattress utilized  Skin Protection: tubing/devices free from skin contact  Taken 03/08/2023 0800 by Placida Cambre, Harlow Ohms, RN  Pressure Reduction Techniques: frequent weight shift encouraged  Head of Bed (HOB) Positioning: HOB at 30-45 degrees  Pressure Reduction Devices: pressure-redistributing mattress utilized  Skin Protection: zinc oxide barrier cream     Problem: Self-Care Deficit  Goal: Improved Ability to Complete Activities of Daily Living  03/08/2023 1752 by Priscella Mann, RN  Outcome: Progressing  03/08/2023 1752 by Priscella Mann, RN  Outcome: Ongoing - Unchanged     Problem: Fall Injury Risk  Goal: Absence of Fall and Fall-Related Injury  03/08/2023 1752 by Melaine Mcphee, Harlow Ohms, RN  Outcome: Progressing  03/08/2023 1752 by Priscella Mann, RN  Outcome: Ongoing - Unchanged  Intervention: Promote Injury-Free Environment  Recent Flowsheet Documentation  Taken 03/08/2023 0800 by Meloney Feld, Harlow Ohms, RN  Safety Interventions:   seizure precautions   aspiration precautions   room near unit station   bed alarm     Problem: Mechanical Ventilation Invasive  Goal: Effective Communication  03/08/2023 1752 by Priscella Mann, RN  Outcome: Resolved  03/08/2023 1752 by Priscella Mann, RN  Outcome: Ongoing - Unchanged  Intervention: Ensure Effective Communication  Recent Flowsheet Documentation  Taken 03/08/2023 0800 by Callahan Peddie, Harlow Ohms, RN  Communication Enhancement Strategies:   repetition utilized   one-step directions provided  Goal: Optimal Device Function  03/08/2023 1752 by Priscella Mann, RN  Outcome: Resolved  03/08/2023 1752 by Priscella Mann, RN  Outcome: Ongoing - Unchanged  Intervention: Optimize Device Care and Function  Recent Flowsheet Documentation  Taken 03/08/2023 0800 by Mylie Mccurley, Harlow Ohms, RN  Oral Care:   mouth swabbed   oral rinse provided   suction provided   teeth brushed  Goal: Mechanical Ventilation Liberation  03/08/2023 1752 by Priscella Mann, RN  Outcome: Resolved  03/08/2023 1752 by Priscella Mann, RN  Outcome: Ongoing - Unchanged  Goal: Optimal Nutrition Delivery  03/08/2023 1752 by Priscella Mann, RN  Outcome: Resolved  03/08/2023 1752 by Priscella Mann, RN  Outcome: Ongoing - Unchanged  Goal: Absence of Device-Related Skin and Tissue Injury  03/08/2023 1752 by Betsabe Iglesia, Harlow Ohms, RN  Outcome: Progressing  03/08/2023 1752 by Priscella Mann, RN  Outcome: Ongoing - Unchanged  Intervention: Maintain Skin and Tissue Health  Recent Flowsheet Documentation  Taken 03/08/2023 0800 by Kamon Fahr, Harlow Ohms, RN  Device Skin Pressure Protection: absorbent pad utilized/changed  Goal: Absence of Ventilator-Induced Lung Injury  03/08/2023 1752 by Priscella Mann, RN  Outcome: Resolved  03/08/2023 1752 by Priscella Mann, RN  Outcome: Ongoing - Unchanged  Intervention: Prevent Ventilator-Associated Pneumonia  Recent Flowsheet  Documentation  Taken 03/08/2023 1600 by Avya Flavell, Harlow Ohms, RN  Head of Bed [facility]) Positioning: HOB at 30 degrees  Taken 03/08/2023 1400 by Furious Chiarelli, Harlow Ohms, RN  Head of Bed [facility]) Positioning: HOB at 30 degrees  Taken 03/08/2023 1200 by Ephraim Reichel, Harlow Ohms, RN  Head of Bed [facility]) Positioning: HOB at 30-45 degrees  Taken 03/08/2023 0800 by Sincerity Cedar, Harlow Ohms, RN  Head of Bed [facility]) Positioning: HOB at 30-45 degrees  Oral Care:   mouth swabbed   oral rinse provided   suction provided   teeth brushed     Problem: Non-Violent Restraints  Goal: Patient will remain free of restraint events  03/08/2023 1752 by Kimber Esterly, Harlow Ohms, RN  Outcome: Resolved  03/08/2023 1752 by Priscella Mann, RN  Outcome: Ongoing - Unchanged  Goal: Patient will remain free of physical injury  03/08/2023 1752 by Daneisha Surges, Harlow Ohms, RN  Outcome: Resolved  03/08/2023 1752 by Priscella Mann, RN  Outcome: Ongoing - Unchanged  Intervention: Utilize least restrictive measures  Recent Flowsheet Documentation  Taken 03/08/2023 1400 by Mahum Betten, Harlow Ohms, RN  Less Restrictive Alternative:   Repositioning   Re-evaluate equipment/device protection   Pain management  Taken 03/08/2023 1200 by Zoie Sarin, Harlow Ohms, RN  Less Restrictive Alternative:   Repositioning   Re-evaluate equipment/device protection   Pain management  Taken 03/08/2023 1000 by Marabeth Melland, Harlow Ohms, RN  Less Restrictive Alternative:   Repositioning   Re-evaluate equipment/device protection   Pain management  Taken 03/08/2023 0930 by Sheffield Hawker, Harlow Ohms, RN  Less Restrictive Alternative:   Repositioning   Re-evaluate equipment/device protection   Pain management  Intervention: Patient Monitoring  Recent Flowsheet Documentation  Taken 03/08/2023 1445 by Iyanah Demont, Harlow Ohms, RN  Psychological Status/Visual Check: (awake following) Other (Comment)  Taken 03/08/2023 1400 by Analissa Bayless, Harlow Ohms, RN  Psychological Status/Visual Check: Asleep  Circulation/Skin Integrity: No signs of injury  Range of Motion: Performed  Fluids: NPO  Food/Meal: NPO  Elimination: Urinary catheter  Taken 03/08/2023 1200 by Darshay Deupree, Harlow Ohms, RN  Psychological Status/Visual Check: Agitated/restless  Circulation/Skin Integrity: No signs of injury  Range of Motion: Performed  Fluids: NPO  Food/Meal: NPO  Elimination: Urinary catheter  Taken 03/08/2023 1000 by Kalea Perine, Harlow Ohms, RN  Psychological Status/Visual Check: Agitated/restless  Circulation/Skin Integrity: No signs of injury  Range of Motion: Performed  Fluids: NPO  Food/Meal: NPO  Elimination: Urinary catheter  Taken 03/08/2023 0930 by Colston Pyle, Harlow Ohms, RN  Psychological Status/Visual Check: Agitated/restless  Circulation/Skin Integrity: No signs of injury  Range of Motion: Performed  Fluids: NPO  Food/Meal: NPO  Elimination: Urinary catheter  Intervention: Patient Education  Recent Flowsheet Documentation  Taken 03/08/2023 0930 by Murl Zogg, Harlow Ohms, RN  Criteria Explained: Yes  Patient's Response: Needs reinforcement  Family Notification: (daughter) Other

## 2023-03-10 LAB — CBC
HEMATOCRIT: 35.5 % (ref 34.0–44.0)
HEMOGLOBIN: 11.5 g/dL (ref 11.3–14.9)
MEAN CORPUSCULAR HEMOGLOBIN CONC: 32.5 g/dL (ref 32.0–36.0)
MEAN CORPUSCULAR HEMOGLOBIN: 28.4 pg (ref 25.9–32.4)
MEAN CORPUSCULAR VOLUME: 87.5 fL (ref 77.6–95.7)
MEAN PLATELET VOLUME: 9 fL (ref 6.8–10.7)
PLATELET COUNT: 66 10*9/L — ABNORMAL LOW (ref 150–450)
RED BLOOD CELL COUNT: 4.06 10*12/L (ref 3.95–5.13)
RED CELL DISTRIBUTION WIDTH: 14.8 % (ref 12.2–15.2)
WBC ADJUSTED: 7 10*9/L (ref 3.6–11.2)

## 2023-03-10 LAB — RENAL FUNCTION PANEL
ALBUMIN: 3 g/dL — ABNORMAL LOW (ref 3.4–5.0)
ANION GAP: 12 mmol/L (ref 5–14)
BLOOD UREA NITROGEN: 61 mg/dL — ABNORMAL HIGH (ref 9–23)
BUN / CREAT RATIO: 14
CALCIUM: 8.7 mg/dL (ref 8.7–10.4)
CHLORIDE: 116 mmol/L — ABNORMAL HIGH (ref 98–107)
CO2: 19 mmol/L — ABNORMAL LOW (ref 20.0–31.0)
CREATININE: 4.25 mg/dL — ABNORMAL HIGH
EGFR CKD-EPI (2021) FEMALE: 11 mL/min/{1.73_m2} — ABNORMAL LOW (ref >=60)
GLUCOSE RANDOM: 112 mg/dL (ref 70–179)
PHOSPHORUS: 4.9 mg/dL (ref 2.4–5.1)
POTASSIUM: 4.7 mmol/L (ref 3.4–4.8)
SODIUM: 147 mmol/L — ABNORMAL HIGH (ref 135–145)

## 2023-03-10 LAB — COMPREHENSIVE METABOLIC PANEL
ALBUMIN: 3.1 g/dL — ABNORMAL LOW (ref 3.4–5.0)
ALKALINE PHOSPHATASE: 518 U/L — ABNORMAL HIGH (ref 46–116)
ALT (SGPT): 176 U/L — ABNORMAL HIGH (ref 10–49)
ANION GAP: 5 mmol/L (ref 5–14)
AST (SGOT): 107 U/L — ABNORMAL HIGH (ref <=34)
BILIRUBIN TOTAL: 0.8 mg/dL (ref 0.3–1.2)
BLOOD UREA NITROGEN: 64 mg/dL — ABNORMAL HIGH (ref 9–23)
BUN / CREAT RATIO: 15
CALCIUM: 9.3 mg/dL (ref 8.7–10.4)
CHLORIDE: 115 mmol/L — ABNORMAL HIGH (ref 98–107)
CO2: 20 mmol/L (ref 20.0–31.0)
CREATININE: 4.38 mg/dL — ABNORMAL HIGH
EGFR CKD-EPI (2021) FEMALE: 11 mL/min/{1.73_m2} — ABNORMAL LOW (ref >=60)
GLUCOSE RANDOM: 239 mg/dL — ABNORMAL HIGH (ref 70–179)
POTASSIUM: 5.1 mmol/L — ABNORMAL HIGH (ref 3.4–4.8)
PROTEIN TOTAL: 6.6 g/dL (ref 5.7–8.2)
SODIUM: 140 mmol/L (ref 135–145)

## 2023-03-10 LAB — MAGNESIUM: MAGNESIUM: 2.2 mg/dL (ref 1.6–2.6)

## 2023-03-10 MED ADMIN — clopidogrel (PLAVIX) tablet 75 mg: 75 mg | ORAL | @ 21:00:00

## 2023-03-10 MED ADMIN — heparin (porcine) 5,000 unit/mL injection 5,000 Units: 5000 [IU] | SUBCUTANEOUS | @ 10:00:00

## 2023-03-10 MED ADMIN — atorvastatin (LIPITOR) tablet 40 mg: 40 mg | ORAL | @ 13:00:00

## 2023-03-10 MED ADMIN — heparin (porcine) 5,000 unit/mL injection 5,000 Units: 5000 [IU] | SUBCUTANEOUS | @ 21:00:00

## 2023-03-10 MED ADMIN — heparin (porcine) 5,000 unit/mL injection 5,000 Units: 5000 [IU] | SUBCUTANEOUS | @ 01:00:00

## 2023-03-10 MED ADMIN — acetaminophen (TYLENOL) tablet 650 mg: 650 mg | GASTROENTERAL | @ 16:00:00

## 2023-03-10 MED ADMIN — senna (SENOKOT) tablet 2 tablet: 2 | GASTROENTERAL | @ 01:00:00 | Stop: 2023-03-21

## 2023-03-10 MED ADMIN — insulin lispro (HumaLOG) injection 0-4 Units: 0-4 [IU] | SUBCUTANEOUS | @ 22:00:00

## 2023-03-10 MED ADMIN — nicotine (NICODERM CQ) 14 mg/24 hr patch 1 patch: 1 | TRANSDERMAL | @ 13:00:00 | Stop: 2023-03-15

## 2023-03-10 NOTE — Unmapped (Addendum)
Patient throughout shift was Amy [name], followed commands. Alert to drowsy.  0400 MAP below parameters. Recheck completed and MD notified. No reports of pain. No PRN's given during shift. Diet is tolerated and intake is fair. No falls or skin changes to report during shift. Up x2 contact guard to bathroom. Q6 bladder scans completed. Bath and linen change completed overnight. Reviewed safety precautions with patient. Family called and updated via phone. Rash noted to skin on back side, MD notified.  Problem: Adult Inpatient Plan of Care  Goal: Plan of Care Review  Outcome: Progressing  Goal: Patient-Specific Goal (Individualized)  Outcome: Progressing  Flowsheets (Taken 03/10/2023 0225)  Patient/Family-Specific Goals (Include Timeframe): Patient will ambulate to bathroom to void throughout the day by 10/26  Goal: Absence of Hospital-Acquired Illness or Injury  Outcome: Progressing  Intervention: Identify and Manage Fall Risk  Recent Flowsheet Documentation  Taken 03/09/2023 2000 by Christena Flake, RN  Safety Interventions:   bed alarm   low bed   nonskid shoes/slippers when out of bed   fall reduction program maintained  Intervention: Prevent Skin Injury  Recent Flowsheet Documentation  Taken 03/09/2023 2000 by Christena Flake, RN  Positioning for Skin: Left  Device Skin Pressure Protection: absorbent pad utilized/changed  Skin Protection: incontinence pads utilized  Intervention: Prevent and Manage VTE (Venous Thromboembolism) Risk  Recent Flowsheet Documentation  Taken 03/10/2023 0200 by Christena Flake, RN  Anti-Embolism Device Type: SCD, Knee  Anti-Embolism Intervention: Refused  Anti-Embolism Device Location: BLE  Taken 03/10/2023 0000 by Christena Flake, RN  Anti-Embolism Device Type: SCD, Knee  Anti-Embolism Intervention: Refused  Anti-Embolism Device Location: BLE  Taken 03/09/2023 2200 by Christena Flake, RN  Anti-Embolism Device Type: SCD, Knee  Anti-Embolism Intervention: Refused  Anti-Embolism Device Location: BLE  Taken 03/09/2023 2000 by Christena Flake, RN  Anti-Embolism Device Type: SCD, Knee  Anti-Embolism Intervention: Refused  Anti-Embolism Device Location: BLE  Goal: Optimal Comfort and Wellbeing  Outcome: Progressing  Goal: Readiness for Transition of Care  Outcome: Progressing  Goal: Rounds/Family Conference  Outcome: Progressing     Problem: Skin Injury Risk Increased  Goal: Skin Health and Integrity  Outcome: Progressing  Intervention: Optimize Skin Protection  Recent Flowsheet Documentation  Taken 03/09/2023 2130 by Christena Flake, RN  Activity Management:   ambulated to bathroom   back to bed  Taken 03/09/2023 2000 by Christena Flake, RN  Pressure Reduction Techniques:   frequent weight shift encouraged   weight shift assistance provided  Head of Bed (HOB) Positioning: HOB at 30-45 degrees  Pressure Reduction Devices: pressure-redistributing mattress utilized  Skin Protection: incontinence pads utilized     Problem: Self-Care Deficit  Goal: Improved Ability to Complete Activities of Daily Living  Outcome: Progressing     Problem: Fall Injury Risk  Goal: Absence of Fall and Fall-Related Injury  Outcome: Progressing  Intervention: Promote Scientist, clinical (histocompatibility and immunogenetics) Documentation  Taken 03/09/2023 2000 by Christena Flake, RN  Safety Interventions:   bed alarm   low bed   nonskid shoes/slippers when out of bed   fall reduction program maintained     Problem: Mechanical Ventilation Invasive  Goal: Absence of Device-Related Skin and Tissue Injury  Outcome: Progressing  Intervention: Maintain Skin and Tissue Health  Recent Flowsheet Documentation  Taken 03/09/2023 2000 by Christena Flake, RN  Device Skin Pressure Protection: absorbent pad utilized/changed

## 2023-03-10 NOTE — Unmapped (Signed)
Endocrine Team Diabetes Follow Up Consult Note     Consult information:  Requesting Attending Physician : Morene Antu, DO  Service Requesting Consult : Neurology (NEU)  Primary Care Provider: [name], MD  Impression:  Amy [name] is a 62 y.o. 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 We have been consulted at the request of [facility], DO to evaluate Vihana for hypoglycemia.     Medical Decision Making:  Diagnoses:  1.Type 2 Diabetes. Uncontrolled With both hypoglycemia and hyperglycemia.  2. Nutrition: Complicating glycemic control. Increasing risk for both hypoglycemia and hyperglycemia.  3. Chronic Kidney Disease. Complicating glycemic control and increasing risk for hypoglycemia.  4. Metastatic pancreatic adenocarcinoma  5. Ischemic stroke        Studies reviewed 03/10/23:  Labs: CBC, BMP, and POCT-BG  Interpretation: Hypernatremia and hypoglycemia.  Notes reviewed: Primary team    Overall impression based on above reviews and history:  Hypoglycemia yesterday evening likely due to poor PO intake. This is supported by hypernatremia which could reflect poor free water intake. Would recommend liberalizing diet to regular to encourage PO and consulting dietitian for supplemental nutrition.    Now more alert and tolerating PO intake with resultant postprandial hyperglycemia. Will restart insulin.    Estimated TDD is 0.4 units/kg/day x 70 kg x 0.8 = 22 units/day.    Recommendations:  Start glargine 10 units  Start lispro 3 units TID AC  Lispro CF ISF 50>140  Change diet to regular to encourage PO intake. Encourage oral hydration.  Dietitian consult  Hypoglycemia protocol.  POCT-BG achs.  Ensure patient is on glucose precautions if patient taking nutrition by mouth.   Endocrinology will continue to follow.    Discharge planning:  Insulin regimen TBD    Thank you for this consult. Discussed plan with primary team.    Please page with questions or concerns: Endocrine Fellow: [phone]  Endocrinology Diabetes Care Team on call from 6AM - 3PM on weekdays then endocrine fellow on call: [phone] from 3PM - 6AM on weekdays and on weekends and holidays.   If APP cannot be reached, please page the endocrine fellow on call.      Subjective:  Interval History:  Patient awake and alert, oriented to person and place but not time or situation. Cannot remember what insulin dose she was on at home. Reports poor appetite.    Initial HPI:  Amy [name] is a 62 y.o. 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 Of note, during a seizure event in the ED, she was found to be hypoglycemic to 18, necessitating dextrose. Since starting tube feeds, she has been more eu- to hyperglycemic. She is on short- and long-acting insulin at home, although the exact doses are unknown as patient not able to communicate this due to AMS. Her diabetes is managed by a provider at an outside network, per her daughter--unable to see this records in EMR. Patient's daughter believes she last took her insulin prior to presenting to the ED.     Diabetes History:  Patient has a history of Type 2 diabetes   Current home diabetes regimen: on glargine and novolog at home, unclear how many units.  Current home blood glucose monitoring: patient unable to answer.  Hypoglycemia awareness: patient unable to answer.  Complications related to diabetes: chronic kidney  disease 2/2 Alport, but DM2 likely contributing       Current Nutrition:  Active Orders   Diet    Nutrition Therapy Consistent Carb; Consistent Carb 75/75/75 (5/5/5)       ROS: As per HPI.     atorvastatin  40 mg Oral Daily    EPINEPHrine        heparin (porcine) for subcutaneous use  5,000 Units Subcutaneous Q8H SCH    lactated ringers  500 mL Intravenous Once    midazolam (PF) nicotine  1 patch Transdermal Daily    Followed by    Melene Muller ON 03/15/2023] nicotine  1 patch Transdermal Daily    polyethylene glycol  17 g Enteral tube: gastric Daily    senna  2 tablet Enteral tube: gastric Nightly       Current Outpatient Medications   Medication Instructions    aspirin (ECOTRIN) 81 mg, Oral, Daily (standard)    AURYXIA 210 mg, Oral, 3 times a day (with meals)    blood sugar diagnostic (TRUE METRIX GLUCOSE TEST STRIP) Strp True Metrix Glucose Test Strip    cholecalciferol (vitamin D3-25 mcg (1,000 unit)) 1,000 Units, Oral, Daily (standard)    clobetasol (TEMOVATE) 0.05 % external solution Topical, 2 times a day (standard), To scalp    clobetasol (TEMOVATE) 0.05 % ointment Apply to your thick psoriasis areas twice daily until clear    cyclobenzaprine (FLEXERIL) 10 mg, Oral, Nightly    empagliflozin (JARDIANCE) 10 mg, Oral, Daily (standard)    lisinopril (PRINIVIL,ZESTRIL) 10 mg, Oral, Daily (standard)    nicotine (NICODERM CQ) 14 mg/24 hr patch 1 patch, Transdermal, Every 24 hours, Remove old patch before applying new patch on skin. After 8 weeks, switch to 7 mg patches.    nicotine (NICODERM CQ) 21 mg/24 hr patch 1 patch, Transdermal, Every 24 hours, Remove old patch before applying new patch on skin. After 8 weeks, switch to 14 mg patches.    nicotine (NICODERM CQ) 7 mg/24 hr patch 1 patch, Transdermal, Every 24 hours    NovoLOG Flexpen U-100 Insulin 30 Units, 3 times a day (standard)    omeprazole (PRILOSEC) 20 mg, Oral, Daily (standard)    oxyCODONE (ROXICODONE) 5 mg, Oral, Every 4 hours PRN    pen needle, diabetic 32 gauge x 5/32 (4 mm) Ndle Injection, 3 times a day (AC)    pregabalin (LYRICA) 50 MG capsule No dose, route, or frequency recorded.    rosuvastatin (CRESTOR) 10 mg, Oral, Daily (standard)    SEMGLEE,INSULIN GLARG-YFGN,PEN 100 unit/mL (3 mL) InPn Inject 50 Units under the skin two (2) times a day.    SYMBICORT 160-4.5 mcg/actuation inhaler No dose, route, or frequency recorded. TRUEPLUS PEN NEEDLE 32 gauge x 5/32 Ndle No dose, route, or frequency recorded.    ustekinumab (STELARA) 45 mg/0.5 mL Syrg syringe Inject the contents of 1 syringe (45 mg) under the skin once every 12 weeks.           Past Medical History:   Diagnosis Date    Alport syndrome     Back pain     Chronic kidney disease     Dental abscess     Diabetes mellitus (CMS-HCC)     type 2, don't remember A1C, controlled with insulin injections; okay per physician at last visit    Hyperlipidemia     Hypertension     well controlled with meds    Kidney stone     no problems since 1995  Psoriasis        Past Surgical History:   Procedure Laterality Date    BREAST BIOPSY Right 05/30/2018    Intraductal papilloma w/ florid DH & apocrine metaplasia    CATARACT EXTRACTION Right 08/29/2017    COLONOSCOPY      cyst removal in breast      HYSTERECTOMY      INSERT ARTERIAL LINE  03/08/2023    NEPHROSTOMY      OOPHORECTOMY      ONLY ONE OVARY REMOVED    PR COLSC FLX W/RMVL OF TUMOR POLYP LESION SNARE TQ N/A 06/19/2018    Procedure: COLONOSCOPY FLEX; W/REMOV TUMOR/LES BY SNARE;  Surgeon: Jarvis Morgan, MD;  Location: [facility];  Service: Gastroenterology    PR XCAPSL CTRC RMVL INSJ IO LENS PROSTH W/O ECP Right 08/29/2017    Procedure: EXTRACAPSULAR CATARACT REMOVAL W/INSERTION OF INTRAOCULAR LENS PROSTHESIS, MANUAL OR MECHANICAL TECHNIQUE Lens Choice:OD ZCB00 +19.50 [id number] +19.00/+18.00;  Surgeon: O'Rese Bonney Roussel, MD;  Location: [facility] OR [facility];  Service: Ophthalmology    PR XCAPSL CTRC RMVL INSJ IO LENS PROSTH W/O ECP Left 09/19/2017    Procedure: EXTRACAPSULAR CATARACT REMOVAL W/INSERTION OF INTRAOCULAR LENS PROSTHESIS, MANUAL OR MECHANICAL TECHNIQUE Lens Choice: ZCB00 +21.00; ZA9003 +20.00/+19.00;  Surgeon: O'Rese Bonney Roussel, MD;  Location: [facility] OR [facility];  Service: Ophthalmology       Family History   Problem Relation Age of Onset    Kidney disease Mother     Diabetes Mother     Cataracts Mother     Heart attack Father     No Known Problems Daughter     Cancer Paternal Grandmother     No Known Problems Other     Breast cancer Neg Hx     Colon cancer Neg Hx     Endometrial cancer Neg Hx     Ovarian cancer Neg Hx     Melanoma Neg Hx     Basal cell carcinoma Neg Hx     Squamous cell carcinoma Neg Hx     BRCA 1/2 Neg Hx        Social History     Tobacco Use    Smoking status: Former     Current packs/day: 0.00     Average packs/day: 1 pack/day for 43.0 years (43.0 ttl pk-yrs)     Types: Cigarettes     Start date: 02/12/1978     Quit date: 02/12/2021     Years since quitting: 2.0    Smokeless tobacco: Never   Vaping Use    Vaping status: Every Day   Substance Use Topics    Alcohol use: No    Drug use: No       OBJECTIVE:  BP 105/49  - Pulse 60  - Temp 36.2 ??C (97.2 ??F) (Axillary)  - Resp 18  - Ht 169 cm (5' 6.54)  - Wt 70.4 kg (155 lb 3.3 oz)  - SpO2 100%  - BMI 24.65 kg/m??   Wt Readings from Last 12 Encounters:   03/07/23 70.4 kg (155 lb 3.3 oz)   02/15/23 69.9 kg (154 lb)   02/01/23 70.7 kg (155 lb 12.8 oz)   12/18/22 67.1 kg (148 lb)   10/26/22 70.3 kg (155 lb)   07/27/22 70.3 kg (155 lb)   04/27/22 70.4 kg (155 lb 4.8 oz)   02/09/22 72.9 kg (160 lb 11.2 oz)   12/15/21 75.8 kg (167 lb 3.2 oz)  09/15/21 75.5 kg (166 lb 8 oz)   07/21/21 75.1 kg (165 lb 9.6 oz)   05/26/21 76.9 kg (169 lb 8 oz)     Physical Exam:  General: female in no apparent distress  HEENT: EOMI, sclera anicteric  Respiratory: No increased work of breathing  Neuro: Awake, AO x 2  Psych: Normal mood and affect  Skin: No abnormal skin pigmentation    BG/insulin reviewed per EMR.   Glucose, POC (mg/dL)   Date Value   54/01/8118 106   03/10/2023 127   03/09/2023 68 (L)   03/09/2023 52 (LL)   03/09/2023 95   03/09/2023 102   03/09/2023 150   03/08/2023 313 (H)        Summary of labs:  Lab Results   Component Value Date    A1C 7.5 (H) 02/15/2023    A1C 7.3 (H) 02/09/2023    A1C 6.2 (H) 12/14/2021     Lab Results   Component Value Date CREATININE 4.25 (H) 03/10/2023     Lab Results   Component Value Date    WBC 9.2 03/09/2023    HGB 11.3 03/09/2023    HCT 34.4 03/09/2023    PLT 71 (L) 03/09/2023       Lab Results   Component Value Date    NA 147 (H) 03/10/2023    K 4.7 03/10/2023    CL 116 (H) 03/10/2023    CO2 19.0 (L) 03/10/2023    BUN 61 (H) 03/10/2023    CREATININE 4.25 (H) 03/10/2023    GLU 112 03/10/2023    CALCIUM 8.7 03/10/2023    MG 2.2 03/10/2023    PHOS 4.9 03/10/2023       Lab Results   Component Value Date    BILITOT 0.6 03/07/2023    BILIDIR 0.30 03/07/2023    PROT 5.9 03/07/2023    ALBUMIN 3.0 (L) 03/10/2023    ALT 172 (H) 03/07/2023    AST 87 (H) 03/07/2023    ALKPHOS 396 (H) 03/07/2023    GGT 27 08/27/2020

## 2023-03-10 NOTE — Unmapped (Signed)
Neurology Inpatient Team A [facility])  Daily Progress Note       Patient: Amy [name]  Code Status: Full Code  Level of Care: ICU status.   LOS: 3 days      Overnight Events & Subjective:     Patient oriented to self and place but not time (states month is May, year is 75 or 2009). Sitting upright comfortably. Pt with macular, erythematous rash on upper and lower back. Recent addition of ASA. Discontinued ASA, substituted with Plavix. Pt agreeable to eating, but has remained sleeping through early morning and afternoon after initial encounter. Continuing glucose checks to prevent hypoglycemia. Exam stable. Palliative to see this afternoon after conversation with pt's daughter at bedside. No other questions nor concerns at this time.     Physical Exam:     General Exam:  General Appearance: Chronically ill appearing.  HEENT: Head is atraumatic and normocephalic. Sclera anicteric without injection. Oropharyngeal membranes are moist with no erythema or exudate. EEG leads on.   Neck: Supple.  Heart: WWP    Neurological Exam:  Mental Status: Awakens by tactile stimulation. Able to follow simple commands and and can speak in sentences with significant prompting. No dysarthria  Cranial Nerves: PERRL 4 mm. Face grossly symmetrical.   Motor Exam: Moving all extremities spontaneously and to command.    Reflexes: Toes downgoing.    Sensory: Reacts to noxious on LUE, BLE. RUE deferred due to agitation  Cerebellar/Coordination/Gait: Deferred due to mental status       Assessment/Plan:     Assessment: Amy [name] is a 62 y.o. female with a past medical history of CKD5, metastatic pancreatic adenocarcinoma (liver mets),  who was admitted to [facility] on 03/07/2023 for recurrent embolic strokes and altered mental status.     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  Shortly thereafter had worsening altered mental status and therefore repeat CTA head was performed with no LVO.  Acute worsening likely due to seizures as described below.  Etiology for her recurrent multifocal strokes is most likely due to hypercoagulable state from her metastatic pancreatic cancer.  She also has tobacco use, diabetes, and hypertension which could be playing a role as well. While anticoagulation would be the treatment for malignancy, we will defer anticoagulation in the setting of further hematological workup.  Will continue Plavix when medically able as patient potentially may have had an allergy to aspirin.     - Non-contrasted CT scan of the head showed no acute abnormality  - A1c 7.5, LDL 69  - echo 10/2 normal. Repeat POCUS here showed trace pericardial effusion  - carotid doppler 02/10/23 showed left IC stenosis <50%  - tele shows sinus bradycardia     Plan:  - Consider transitioning patient to plavix when able due to potential allergy  - resume home rosuvastatin 10mg   - Will need smoking cessation counseling [x]   - START Clopidogrel 75 mg  - PT/OT consulted     #AMS:   #Concern for provoked seizures  Patient was waiting in the emergency department prior to discharge, when she began to have altered mental status.  Last known normal 4 PM, GCS 15 prior to this, but on reevaluation at 8 PM GCS was 7, patient was nonverbal, not opening eyes, moving right side briskly to noxious and left side weakly to noxious.  She was given 4 mg Ativan  and 4.5 mg Keppra load. Per ED, sonorous respiration, having repetitive motion of her right upper extremity, lip smacking, and obtunded. She had multiple erratic glucoses while here, initially normal, then low at 18, immediate recheck 357.  Etiology of altered mental status most likely from acute symptomatic seizure from hypoglycemia.  However given the location of the semiology and contrast-enhancing lesion in left hemisphere, seizure could also potentially be from a metastatic lesion.  We would favor continuing antiseizure medicines but currently on hold from concerns for sedation.    - s/p keppra 4.5g and ativan 4mg     #FEN  - START regular diet  - START calorie count  - CONSULT nutrition  - CONSIDER NGT should PO intake worsen  - CONTINUE POCT achs  - Due to mental status, patient declining food  - Will monitor  - Receiving maintenance fluids    # diabetes:  # Hypoglycemia  Patient had erratic glucoses and at one time received 1 amp of dextrose while in ED and hypoglycemia possibly responsible for AMS/seizures. Unclear etiology as she did not receive any antihyperglycemics in ED, possibly related to pancreatic cancer.  Endocrinology felt this was iatrogenic as she received insulin prior to coming to ED. Negative cosyntropin stim test.   - Appreciate endocrinology recs  - continue SSI     #Metastatic pancreatic cancer with liver mets  First discovered 01/2023 with presentation for GI symptoms. Biopsy confirmed last week. It is unclear if there is brain involvement at this time. She has followup with hematology scheduled 03/15/2023.  -consulted oncology  -CONSULT Palliative care, appreciate recs     #CKD5: has both alport syndrome and diabetes. Not on dialysis at this time. on home bicarb daily  - CTM     #Transaminitis: likely iso metastatic liver disease  - ctm    # C/f Urinary Retention - AMS  Pt's bladder scans have been less than 50 ml since arrival to floor.  - DISCONTINUE IO caths  - No need for Foley at this time    STABLE PROBLEMS:    # HTN:  - home lisinopril      # Pain:  - holding home lyrica     # HLD:  - home rosuvastatin      RESOLVED PROBLEMS:  # Intubation  - Patient required intubation for low GCS. Now extubated.       # Discharge Planning:   - Case management: pending.  - Social work: N/A  - PT: consulted, TBD  - OT: consulted, TBD  - SLP: consulted, TBD  - PM&R: N/A  - Expected Discharge Disposition: TBD.  Facility: TBD  - Follow-up appointments with TBD.     # Checklist:  - Diet: Strict NPO until patient passes bedside dysphagia screen or evaluation by speech-language pathology  - IV fluids: no  - Bowel Regimen: Miralax 17 g daily and Senna 2 tabs at night  - GI PPX: H2 blocker  - DVT PPX: Heparin 5000 units [place] q8h  - Lines/Access: PIV x1.    - Foley: No    This patient was seen and discussed with Dr. Darlina Sicilian, who agrees with the above assessment and plan.      This patient is co-managed with the NSICU Service. Under this co-management agreement, the NSICU serves as the first-call team. Please page the NSICU team at [phone] for any questions/concerns.  Neurology Team A resident can be reached at [phone].    Jen Mow, MD  [facility] Neurology, PGY-1  Data Review:   Discussed in A/P

## 2023-03-10 NOTE — Unmapped (Signed)
Palliative Care Consult Note    Consultation from Requesting Attending Physician:  Morene Antu, DO  Service Requesting Consult:  Neurology (NEU)  Reason for Consult Request from Attending Physician:  Evaluation of Goals of Care / Decision Making  Primary Care Provider:  [name], MD        Assessment/Plan:      SUMMARY:  This 62 y.o. patient is seriously and acutely ill due to altered mental status and seizure due to hypoglycemia, complicated by co-morbid acute and chronic conditions including metastatic pancreatic cancer with liver metastasis and R occipital lesion c/f mets vs embolic stroke, Alport syndrome, CKD stage V, and T2DM.    Symptom Assessment and Recommendations:      # Epigastric pain due to cancer:  - Continue tylenol 650 mg PO every 8 hours prn  - For moderate to severe pain, would add oxycodone 5 mg PO every 6 hours prn  - Consider H2 blocker or PPI    Advance Care Planning     Goals of Care and Decision Making Assessment and Recommendations:     Healthcare Decision Maker if lacks capacity:    HCDM (patient stated preference) (Active): Donnamarie Rossetti - Daughter - [phone]    Advance Directive: no    Code status:   Code Status: Full Code         Goals of care as discussed on 03/10/23:   Amy [name] is still processing her recent cancer diagnosis and what this means for her. She seemed to not fully remember what she had been diagnosed with or the extent of the disease which may be related to recent events vs a component of denial to cope with this serious diagnosis. With her permission shared with her that she has metastatic pancreatic cancer and that it has spread to her liver. Discussed that the cancer may be treated with chemotherapy with the goal of trying to control the cancer for a period of time but it will not cure the cancer. Discussed that her medical history is complex and may effect how she tolerates treatment and what treatment options are available to her. In general she is wanting to pursue therapies that will help her to have more time, to see her grandkids grow up, but also not feel poorly. Continuing to work is a goal of hers if at all possible.    Her daughter has a very clear understanding of what Ms. Schwieger's diagnosis is and what her options are for now. She appreciates that time for her mother may be shorter than what either of them would want and is hoping that chemotherapy will give her some more time. She did share that she wonders if the chemotherapy is ultimately going to be worth it in the setting of her mother's progress kidney disease. She favors her mother trying chemotherapy to start to see how she tolerates it but would support her if she wanted to stop it because of it making her feel poorly.    As for her kidneys, it's not completely clear to me what her options are at this time. There have been recent discussions about starting dialysis, specifically peritoneal dialysis, but no set timeframe as far as I can tell as to when. Ms. Guia has been hoping to hold off on dialysis and her numbers have been such that she hasn't needed to start yet. She was evaluated for kidney transplant and is not a candidate due to her recent cancer diagnosis. Unfortunately, her long-time nephrology provider  died suddenly and she is set up with a new provider at the same clinic in November. It seems like the decision around starting dialysis with have a new layer of complexity with the cancer diagnosis which I shared with them both but at this time not sure exactly what her options even are and these discussions will need to continue as more information is gathered.          Practical, Emotional, Spiritual Support Assessment and Recommendations:  - Patient and daughter feeling overwhelmed with everything going on. Daughter is primary caregiver and has 2 young children (30 years old and 26 years old) and is working. Patient also continues to work. Daughter hoping for guidance on HCPOA and financial power of attorney as well as requesting increased services through medicaid such as CAP or PCS services. I will pass this information along to the oncology nurse navigator to see if someone in the Oncology Clinic can assist with guiding her on these next step. If remains in the hospital through the weekend, will ask palliative care social worker to see on Monday 10/28.  - Patient asked that I update her brother Iantha Fallen which I did by phone after our visit and provided him with information on Palliative care and the support we provide.    - On discharge I would recommend referral to Outpatient Oncology Palliative Care:  To place a referral for [facility] Outpatient Oncology Palliative Care:  - Order in discharge med rec: Ambulatory referral to Palliative Care   - Class: Internal referral   - Department Specialty: Palliative Medicine   - To Department: [facility] Palliative Care Oncology [facility]   - To provider: leave blank   - Comments: reason for referral (GOC, symptom management, etc.)  If discharging a patient on opioids, make sure they have an adequate supply to make it to their next oncology or palliative care appointment (typically 3-4 week supply). [place] STOP ACT does NOT apply to patients with cancer-related pain.         Recommendations shared with primary team via Epic chat      Thank you for this consult. Please page Palliative Care if there are any questions.   Palliative Care plans to visit the patient again on Monday 10/28     Subjective:     HPI:  obtained from patient and her daughter as patient has some forgetfulness to recent events likely due to acute illness.    This 62 y.o. patient is seriously and acutely ill due to altered mental status and seizure due to hypoglycemia, complicated by co-morbid acute and chronic conditions including metastatic pancreatic cancer with liver metastasis and R occipital lesion c/f mets vs embolic stroke, Alport syndrome, CKD stage V, and T2DM. She is overall feeling okay today. Has some epigastric pain and feels the need to burp off and on.       Symptom Severity, Assessment and Current Medication / Treatment:     Pain:  epigastric pain  Shortness of breath:  none  Nausea:  none  Constipation:  none  Other symptoms:            Objective:       Function:  70% - Ambulation: Reduced / unable to do normal work, some evidence of disease / Self-Care: Full / Intake: Normal or reduced / Level of Conscious: Full    Temp:  [36.2 ??C (97.2 ??F)-36.9 ??C (98.4 ??F)] 36.9 ??C (98.4 ??F)  Heart Rate:  [60-70] 70  SpO2 Pulse:  [  62-67] 67  Resp:  [11-18] 16  BP: (87-113)/(43-63) 95/58  SpO2:  [96 %-100 %] 99 %    Physical Exam:  Sitting up in bed, able to re-position herself, awake and alert to self and being in the hospital, conversant, no fatigue noted, no tremors or myoclonus, normal work of breathing, abdomen nondistended    Testing reviewed and interpreted:   Reviewed and interpreted test results for Cr, MRI Brain, liver biopsy results and [facility] neprhology and [facility] Oncology clinic notes affecting assessment of underlying illness severity and prognosis    I personally spent 90 minutes face-to-face and non-face-to-face in the care of this patient, which includes all pre, intra, and post visit time on the date of service.  All documented time was specific to the E/M visit and does not include any procedures that may have been performed.     See ACP Note from today for additional billable service:  No.

## 2023-03-11 LAB — COMPREHENSIVE METABOLIC PANEL
ALBUMIN: 3.3 g/dL — ABNORMAL LOW (ref 3.4–5.0)
ALKALINE PHOSPHATASE: 622 U/L — ABNORMAL HIGH (ref 46–116)
ALT (SGPT): 230 U/L — ABNORMAL HIGH (ref 10–49)
ANION GAP: 12 mmol/L (ref 5–14)
AST (SGOT): 150 U/L — ABNORMAL HIGH (ref <=34)
BILIRUBIN TOTAL: 0.8 mg/dL (ref 0.3–1.2)
BLOOD UREA NITROGEN: 64 mg/dL — ABNORMAL HIGH (ref 9–23)
BUN / CREAT RATIO: 16
CALCIUM: 9.5 mg/dL (ref 8.7–10.4)
CHLORIDE: 109 mmol/L — ABNORMAL HIGH (ref 98–107)
CO2: 20 mmol/L (ref 20.0–31.0)
CREATININE: 4.1 mg/dL — ABNORMAL HIGH
EGFR CKD-EPI (2021) FEMALE: 12 mL/min/{1.73_m2} — ABNORMAL LOW (ref >=60)
GLUCOSE RANDOM: 267 mg/dL — ABNORMAL HIGH (ref 70–179)
POTASSIUM: 4.7 mmol/L (ref 3.4–4.8)
PROTEIN TOTAL: 7.4 g/dL (ref 5.7–8.2)
SODIUM: 141 mmol/L (ref 135–145)

## 2023-03-11 LAB — SODIUM: SODIUM: 141 mmol/L (ref 135–145)

## 2023-03-11 MED ORDER — INSULIN GLARGINE (U-100) 100 UNIT/ML SUBCUTANEOUS SOLUTION
Freq: Every evening | SUBCUTANEOUS | 0 refills | 30.00000 days | Status: CN
Start: 2023-03-11 — End: 2023-04-10

## 2023-03-11 MED ORDER — INSULIN SYRINGE U-100 WITH NEEDLE 1/2 ML 31 GAUGE X 1/4" (6 MM)
0 refills | 0.00000 days
Start: 2023-03-11 — End: ?

## 2023-03-11 MED ORDER — LANCETS
0 refills | 0.00000 days
Start: 2023-03-11 — End: ?

## 2023-03-11 MED ORDER — PEN NEEDLE, DIABETIC 32 GAUGE X 5/32" (4 MM)
0 refills | 0.00000 days
Start: 2023-03-11 — End: ?

## 2023-03-11 MED ORDER — INSULIN LISPRO (U-100) 100 UNIT/ML SUBCUTANEOUS SOLUTION
Freq: Four times a day (QID) | SUBCUTANEOUS | 0 refills | 32.00000 days
Start: 2023-03-11 — End: 2023-04-10

## 2023-03-11 MED ORDER — BLOOD GLUCOSE TEST STRIPS
ORAL_STRIP | 0 refills | 0.00000 days | Status: CN
Start: 2023-03-11 — End: ?

## 2023-03-11 MED ORDER — OXYCODONE 5 MG TABLET
ORAL_TABLET | ORAL | 0 refills | 21.00000 days | PRN
Start: 2023-03-11 — End: 2023-04-01

## 2023-03-11 MED ORDER — BLOOD-GLUCOSE METER KIT WRAPPER
0 refills | 0.00000 days
Start: 2023-03-11 — End: 2024-03-10

## 2023-03-11 MED ADMIN — insulin lispro (HumaLOG) injection 0-6 Units: 0-6 [IU] | SUBCUTANEOUS | @ 23:00:00

## 2023-03-11 MED ADMIN — acetaminophen (TYLENOL) tablet 650 mg: 650 mg | GASTROENTERAL | @ 05:00:00 | Stop: 2023-03-11

## 2023-03-11 MED ADMIN — insulin lispro (HumaLOG) injection 0-4 Units: 0-4 [IU] | SUBCUTANEOUS | @ 03:00:00

## 2023-03-11 MED ADMIN — heparin (porcine) 5,000 unit/mL injection 5,000 Units: 5000 [IU] | SUBCUTANEOUS | @ 18:00:00

## 2023-03-11 MED ADMIN — senna (SENOKOT) tablet 2 tablet: 2 | ORAL | @ 02:00:00 | Stop: 2023-03-21

## 2023-03-11 MED ADMIN — insulin lispro (HumaLOG) injection 0-4 Units: 0-4 [IU] | SUBCUTANEOUS | @ 14:00:00 | Stop: 2023-03-11

## 2023-03-11 MED ADMIN — insulin glargine (LANTUS) injection 10 Units: 10 [IU] | SUBCUTANEOUS | @ 03:00:00

## 2023-03-11 MED ADMIN — insulin NPH (HumuLIN,NovoLIN) injection 10 Units: 10 [IU] | SUBCUTANEOUS | @ 21:00:00 | Stop: 2023-03-11

## 2023-03-11 MED ADMIN — insulin lispro (HumaLOG) injection 0-4 Units: 0-4 [IU] | SUBCUTANEOUS | @ 19:00:00 | Stop: 2023-03-11

## 2023-03-11 MED ADMIN — clopidogrel (PLAVIX) tablet 75 mg: 75 mg | ORAL | @ 14:00:00

## 2023-03-11 MED ADMIN — heparin (porcine) 5,000 unit/mL injection 5,000 Units: 5000 [IU] | SUBCUTANEOUS | @ 02:00:00

## 2023-03-11 MED ADMIN — insulin lispro (HumaLOG) injection 4 Units: 4 [IU] | SUBCUTANEOUS | @ 14:00:00 | Stop: 2023-03-11

## 2023-03-11 MED ADMIN — heparin (porcine) 5,000 unit/mL injection 5,000 Units: 5000 [IU] | SUBCUTANEOUS | @ 09:00:00

## 2023-03-11 MED ADMIN — nicotine (NICODERM CQ) 14 mg/24 hr patch 1 patch: 1 | TRANSDERMAL | @ 14:00:00 | Stop: 2023-03-15

## 2023-03-11 MED ADMIN — atorvastatin (LIPITOR) tablet 40 mg: 40 mg | ORAL | @ 14:00:00

## 2023-03-11 NOTE — Unmapped (Signed)
Neurology Inpatient Team A [facility])  Daily Progress Note       Patient: Amy [name]  Code Status: Full Code  Level of Care: ICU status.   LOS: 4 days      Overnight Events & Subjective:     Mental status improved. AOOx4, soon to eat breakfast. Delivered during rounds. Patient did not sleep well due to epigastric pain. Chronic pain. Added Oxy 5 q6h per Palliative and Melatonin 3 mg. Patient to discuss with Oncology prior to discharge about chemotherapy plan and wishes regarding chemotherapy. Outpatient palliative referral placed. Nephrology outpatient appt mid-November, but requesting earlier appointment prior to making plans with Palliative. Will discuss with team. No complaints otherwise. LFTs increased in last 24 hours, likely 2/2 to pancreatic ca and liver mets. Will continue to monitor.     Physical Exam:     General Exam:  General Appearance: Chronically ill appearing.  HEENT: Head is atraumatic and normocephalic. Sclera anicteric without injection. Oropharyngeal membranes are moist with no erythema or exudate. EEG leads on.   Neck: Supple.  Heart: WWP    Neurological Exam:  Mental Status: Awakens by tactile stimulation. Able to follow simple commands and and can speak in sentences with significant prompting. No dysarthria  Cranial Nerves: PERRL 4 mm. Face grossly symmetrical.   Motor Exam: Moving all extremities spontaneously and to command.    Reflexes: Toes downgoing.    Sensory: Reacts to noxious on LUE, BLE. RUE deferred due to agitation  Cerebellar/Coordination/Gait: Deferred due to mental status       Assessment/Plan:     Assessment: Amy [name] is a 62 y.o. female with a past medical history of CKD5, metastatic pancreatic adenocarcinoma (liver mets),  who was admitted to [facility] on 03/07/2023 for recurrent embolic strokes and altered mental status.     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  Shortly thereafter had worsening altered mental status and therefore repeat CTA head was performed with no LVO.  Acute worsening likely due to seizures as described below.  Etiology for her recurrent multifocal strokes is most likely due to hypercoagulable state from her metastatic pancreatic cancer.  She also has tobacco use, diabetes, and hypertension which could be playing a role as well. While anticoagulation would be the treatment for malignancy, we will defer anticoagulation in the setting of further hematological workup.  Will continue Plavix when medically able as patient potentially may have had an allergy to aspirin.     - Non-contrasted CT scan of the head showed no acute abnormality  - A1c 7.5, LDL 69  - echo 10/2 normal. Repeat POCUS here showed trace pericardial effusion  - carotid doppler 02/10/23 showed left IC stenosis <50%  - tele shows sinus bradycardia     Plan:  - Consider transitioning patient to plavix when able due to potential allergy  - resume home rosuvastatin 10mg   - Will need smoking cessation counseling [x]   - CONT. Clopidogrel 75 mg  - PT/OT consulted     #AMS:   #Concern for provoked seizures  Patient was waiting in the emergency department prior to discharge, when she began to have altered mental status.  Last known normal 4 PM, GCS 15 prior to this, but on reevaluation at 8 PM GCS was 7, patient was nonverbal, not opening eyes, moving right side briskly to noxious and left side weakly to noxious.  She was given 4 mg Ativan and 4.5 mg Keppra load. Per ED, sonorous respiration, having repetitive motion of her right upper extremity, lip smacking, and obtunded. She had multiple erratic glucoses while here, initially normal, then low at 18, immediate recheck 357.  Etiology of altered mental status most likely from acute symptomatic seizure from hypoglycemia.  However given the location of the semiology and contrast-enhancing lesion in left hemisphere, seizure could also potentially be from a metastatic lesion.  We would favor continuing antiseizure medicines but currently on hold from concerns for sedation.    - s/p keppra 4.5g and ativan 4mg     #FEN  - START regular diet  - CONTINUE calorie count  - CONSULTED nutrition, appreciate recs  + Nepro BID  + Pt counseled on limiting sweet tea, sugary drinks  - CONTINUE POCT achs  - Due to mental status, patient declining food  - Will monitor  - Receiving maintenance fluids    # diabetes:  # Hypoglycemia  Patient had erratic glucoses and at one time received 1 amp of dextrose while in ED and hypoglycemia possibly responsible for AMS/seizures. Unclear etiology as she did not receive any antihyperglycemics in ED, possibly related to pancreatic cancer.  Endocrinology felt this was iatrogenic as she received insulin prior to coming to ED. Negative cosyntropin stim test.   - Appreciate endocrinology recs, adjusted insulin regimen  Glargine 10 units  Increase lispro 3 to 4 units TID AC  Lispro CF ISF 50>140  Recommend diabetes education consult, patient was not consistently checking fingersticks prior to giving insulin at home  Hypoglycemia protocol.  POCT-BG achs.  Ensure patient is on glucose precautions if patient taking nutrition by mouth.   Endocrinology will continue to follow.  - continue SSI  - Diabetes educator on Monday, 10/28     #Metastatic pancreatic cancer with liver mets  First discovered 01/2023 with presentation for GI symptoms. Biopsy confirmed last week. It is unclear if there is brain involvement at this time. She has followup with hematology scheduled 03/15/2023. Palliative care to FU outpatient. Oncology to follow-up outpatient.  - START Oxycodone 5 mg q6h  - CONTINUE Tylenol prn  - START Tums TID prior to meals  - CONSULTED oncology, appreciate recs  -CONSULTED Palliative care, appreciate recs     #CKD5: has both alport syndrome and diabetes. Not on dialysis at this time. on home bicarb daily  - CTM     #Transaminitis: likely iso metastatic liver disease  - ctm    # C/f Urinary Retention - AMS  Pt's bladder scans have been less than 50 ml since arrival to floor.  - DISCONTINUE IO caths  - No need for Foley at this time    STABLE PROBLEMS:    # HTN:  Pt with hypotension on arrival to floor likely 2/2 poor PO intake. Received 1 L of maintenance fluids.  - HELD home lisinopril      # Pain:  - holding home lyrica     # HLD:  - home rosuvastatin      RESOLVED PROBLEMS:  # Intubation  - Patient required intubation for low GCS. Now extubated.       # Discharge Planning:   - Case management: pending.  - Social work: N/A  - PT: consulted, TBD  - OT: consulted, TBD  - SLP: consulted, TBD  - PM&R: N/A  - Expected Discharge Disposition: TBD.  Facility: TBD  - Follow-up appointments with TBD.     #  Checklist:  - Diet: Strict NPO until patient passes bedside dysphagia screen or evaluation by speech-language pathology  - IV fluids: no  - Bowel Regimen: Miralax 17 g daily and Senna 2 tabs at night  - GI PPX: H2 blocker  - DVT PPX: Heparin 5000 units [place] q8h  - Lines/Access: PIV x1.    - Foley: No    This patient was seen and discussed with Dr. Darlina Sicilian, who agrees with the above assessment and plan.      This patient is co-managed with the NSICU Service. Under this co-management agreement, the NSICU serves as the first-call team. Please page the NSICU team at [phone] for any questions/concerns.  Neurology Team A resident can be reached at [phone].    Jen Mow, MD  [facility] Neurology, PGY-1     Data Review:   Discussed in A/P

## 2023-03-11 NOTE — Unmapped (Signed)
Endocrine Team Diabetes Follow Up Consult Note     Consult information:  Requesting Attending Physician : Morene Antu, DO  Service Requesting Consult : Neurology (NEU)  Primary Care Provider: [name], MD  Impression:  Amy [name] is a 62 y.o. 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 We have been consulted at the request of [facility], DO to evaluate Azayla for hypoglycemia.     Medical Decision Making:  Diagnoses:  Type 2 Diabetes. Uncontrolled With severe hyperglycemia last 24 hours.  Severe hypoglycemia during seizure  Nutrition: Complicating glycemic control. Increasing risk for both hypoglycemia and hyperglycemia.  Chronic Kidney Disease. Complicating glycemic control and increasing risk for hypoglycemia.  Metastatic pancreatic adenocarcinoma  Ischemic stroke    Studies reviewed 03/11/23:  Labs: CBC, BMP, and POCT-BG  Interpretation: Severe hyperglycemia overnight.  Notes reviewed: Primary team    Overall impression based on above reviews and history:  Patient recalls taking Semglee 100 units daily and Novolog 30 units TID AC at home. This is significantly higher than her calculated weight-based total daily dose. This most likely was the etiology for severe hypoglycemia on presentation.    Severe hyperglycemia. Due to high insulin requirements PTA, will double inpatient regimen. She likely will not need such high doses of insulin on discharge.    Recommendations:  Increase Glargine 10 to 20 units  Increase lispro 4 to 8 units TID AC  Lispro CF ISF 50>140  Recommend diabetes education consult, patient was not consistently checking fingersticks prior to giving insulin at home  Hypoglycemia protocol.  POCT-BG achs.  Ensure patient is on glucose precautions if patient taking nutrition by mouth.   Endocrinology will continue to follow.    Discharge planning:  Insulin regimen TBD    Thank you for this consult. Discussed plan with primary team.    Please page with questions or concerns: Endocrine Fellow: [phone]    Elliot Gurney, MD  Endocrinology Fellow PGY-4    Subjective:  Interval History:  Patient's mentation improved today. Reports she was taking Semglee 100 units daily and Novolog 30 units TID AC at home.    Initial HPI:  Amy [name] is a 62 y.o. 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 Of note, during a seizure event in the ED, she was found to be hypoglycemic to 18, necessitating dextrose.  She is on short- and long-acting insulin at home, although the exact doses are unknown as patient not able to communicate this due to AMS. Her diabetes is managed by a provider at an outside network, per her daughter--unable to see this records in EMR. Patient's daughter believes she last took her insulin prior to presenting to the ED.     Diabetes History:  Patient has a history of Type 2 diabetes   Follows: PCP  Current home diabetes regimen: Semglee 100 units daily, Novolog 30 units TID AC  Current home blood glucose monitoring: fingersticks, but inconsistent. Was about to start Dexcom before admission.  Hypoglycemia awareness: unknown  Complications related to diabetes: chronic kidney disease 2/2 Alport, but DM2 likely contributing       Current Nutrition:  Active Orders   Diet    Nutrition Therapy Regular/House       ROS: As per HPI.     atorvastatin  40 mg Oral Daily  clopidogrel  75 mg Oral Daily    EPINEPHrine        heparin (porcine) for subcutaneous use  5,000 Units Subcutaneous Q8H SCH    insulin glargine  10 Units Subcutaneous Nightly    insulin lispro  0-4 Units Subcutaneous ACHS    insulin lispro  4 Units Subcutaneous 3xd Meals    lactated ringers  500 mL Intravenous Once    midazolam (PF)        nicotine  1 patch Transdermal Daily    Followed by    Melene Muller ON 03/15/2023] nicotine  1 patch Transdermal Daily    polyethylene glycol  17 g Oral Daily    senna  2 tablet Oral Nightly       Current Outpatient Medications   Medication Instructions    aspirin (ECOTRIN) 81 mg, Oral, Daily (standard)    AURYXIA 210 mg, Oral, 3 times a day (with meals)    blood sugar diagnostic (TRUE METRIX GLUCOSE TEST STRIP) Strp True Metrix Glucose Test Strip    cholecalciferol (vitamin D3-25 mcg (1,000 unit)) 1,000 Units, Oral, Daily (standard)    clobetasol (TEMOVATE) 0.05 % external solution Topical, 2 times a day (standard), To scalp    clobetasol (TEMOVATE) 0.05 % ointment Apply to your thick psoriasis areas twice daily until clear    cyclobenzaprine (FLEXERIL) 10 mg, Oral, Nightly    empagliflozin (JARDIANCE) 10 mg, Oral, Daily (standard)    lisinopril (PRINIVIL,ZESTRIL) 10 mg, Oral, Daily (standard)    nicotine (NICODERM CQ) 14 mg/24 hr patch 1 patch, Transdermal, Every 24 hours, Remove old patch before applying new patch on skin. After 8 weeks, switch to 7 mg patches.    nicotine (NICODERM CQ) 21 mg/24 hr patch 1 patch, Transdermal, Every 24 hours, Remove old patch before applying new patch on skin. After 8 weeks, switch to 14 mg patches.    nicotine (NICODERM CQ) 7 mg/24 hr patch 1 patch, Transdermal, Every 24 hours    NovoLOG Flexpen U-100 Insulin 30 Units, 3 times a day (standard)    omeprazole (PRILOSEC) 20 mg, Oral, Daily (standard)    oxyCODONE (ROXICODONE) 5 mg, Oral, Every 4 hours PRN    pen needle, diabetic 32 gauge x 5/32 (4 mm) Ndle Injection, 3 times a day (AC)    pregabalin (LYRICA) 50 MG capsule No dose, route, or frequency recorded.    rosuvastatin (CRESTOR) 10 mg, Oral, Daily (standard)    SEMGLEE,INSULIN GLARG-YFGN,PEN 100 unit/mL (3 mL) InPn Inject 50 Units under the skin two (2) times a day.    sodium bicarbonate 1,300 mg, Oral, 2 times a day (standard)    TRUEPLUS PEN NEEDLE 32 gauge x 5/32 Ndle No dose, route, or frequency recorded.    ustekinumab (STELARA) 45 mg/0.5 mL Syrg syringe Inject the contents of 1 syringe (45 mg) under the skin once every 12 weeks.           Past Medical History:   Diagnosis Date    Alport syndrome     Back pain     Chronic kidney disease     Dental abscess     Diabetes mellitus (CMS-HCC)     type 2, don't remember A1C, controlled with insulin injections; okay per physician at last visit    Hyperlipidemia     Hypertension     well controlled with meds    Kidney stone     no problems since 1995    Psoriasis        Past  Surgical History:   Procedure Laterality Date    BREAST BIOPSY Right 05/30/2018    Intraductal papilloma w/ florid DH & apocrine metaplasia    CATARACT EXTRACTION Right 08/29/2017    COLONOSCOPY      cyst removal in breast      HYSTERECTOMY      INSERT ARTERIAL LINE  03/08/2023    NEPHROSTOMY      OOPHORECTOMY      ONLY ONE OVARY REMOVED    PR COLSC FLX W/RMVL OF TUMOR POLYP LESION SNARE TQ N/A 06/19/2018    Procedure: COLONOSCOPY FLEX; W/REMOV TUMOR/LES BY SNARE;  Surgeon: Jarvis Morgan, MD;  Location: [facility];  Service: Gastroenterology    PR XCAPSL CTRC RMVL INSJ IO LENS PROSTH W/O ECP Right 08/29/2017    Procedure: EXTRACAPSULAR CATARACT REMOVAL W/INSERTION OF INTRAOCULAR LENS PROSTHESIS, MANUAL OR MECHANICAL TECHNIQUE Lens Choice:OD ZCB00 +19.50 [id number] +19.00/+18.00;  Surgeon: O'Rese Bonney Roussel, MD;  Location: [facility] OR [facility];  Service: Ophthalmology    PR XCAPSL CTRC RMVL INSJ IO LENS PROSTH W/O ECP Left 09/19/2017    Procedure: EXTRACAPSULAR CATARACT REMOVAL W/INSERTION OF INTRAOCULAR LENS PROSTHESIS, MANUAL OR MECHANICAL TECHNIQUE Lens Choice: ZCB00 +21.00; [id number] +20.00/+19.00;  Surgeon: O'Rese Bonney Roussel, MD;  Location[facility] OR [facility];  Service: Ophthalmology       Family History   Problem Relation Age of Onset    Kidney disease Mother     Diabetes Mother     Cataracts Mother     Heart attack Father     No Known Problems Daughter     Cancer Paternal Grandmother     No Known Problems Other Breast cancer Neg Hx     Colon cancer Neg Hx     Endometrial cancer Neg Hx     Ovarian cancer Neg Hx     Melanoma Neg Hx     Basal cell carcinoma Neg Hx     Squamous cell carcinoma Neg Hx     BRCA 1/2 Neg Hx        Social History     Tobacco Use    Smoking status: Former     Current packs/day: 0.00     Average packs/day: 1 pack/day for 43.0 years (43.0 ttl pk-yrs)     Types: Cigarettes     Start date: 02/12/1978     Quit date: 02/12/2021     Years since quitting: 2.0    Smokeless tobacco: Never   Vaping Use    Vaping status: Every Day   Substance Use Topics    Alcohol use: No    Drug use: No       OBJECTIVE:  BP 120/68  - Pulse 74  - Temp 36.4 ??C (97.5 ??F) (Oral)  - Resp 16  - Ht 169 cm (5' 6.54)  - Wt 65.6 kg (144 lb 10 oz)  - SpO2 100%  - BMI 22.97 kg/m??   Wt Readings from Last 12 Encounters:   03/11/23 65.6 kg (144 lb 10 oz)   02/15/23 69.9 kg (154 lb)   02/01/23 70.7 kg (155 lb 12.8 oz)   12/18/22 67.1 kg (148 lb)   10/26/22 70.3 kg (155 lb)   07/27/22 70.3 kg (155 lb)   04/27/22 70.4 kg (155 lb 4.8 oz)   02/09/22 72.9 kg (160 lb 11.2 oz)   12/15/21 75.8 kg (167 lb 3.2 oz)   09/15/21 75.5 kg (166 lb 8 oz)   07/21/21 75.1 kg (165  lb 9.6 oz)   05/26/21 76.9 kg (169 lb 8 oz)     Physical Exam:  General: female in no apparent distress  HEENT: EOMI, sclera anicteric  Respiratory: No increased work of breathing  Neuro: Awake, alert  Psych: Normal mood and affect  Skin: No abnormal skin pigmentation    BG/insulin reviewed per EMR.   Glucose, POC (mg/dL)   Date Value   16/02/9603 343 (H)   03/10/2023 409 (HH)   03/10/2023 397 (H)   03/10/2023 226 (H)   03/10/2023 102   03/10/2023 106   03/10/2023 127   03/09/2023 68 (L)        Summary of labs:  Lab Results   Component Value Date    A1C 7.5 (H) 02/15/2023    A1C 7.3 (H) 02/09/2023    A1C 6.2 (H) 12/14/2021     Lab Results   Component Value Date    CREATININE 4.10 (H) 03/11/2023     Lab Results   Component Value Date    WBC 7.0 03/10/2023    HGB 11.5 03/10/2023    HCT 35.5 03/10/2023    PLT 66 (L) 03/10/2023       Lab Results   Component Value Date    NA 141 03/11/2023    NA 141 03/11/2023    K 4.7 03/11/2023    CL 109 (H) 03/11/2023    CO2 20.0 03/11/2023    BUN 64 (H) 03/11/2023    CREATININE 4.10 (H) 03/11/2023    GLU 267 (H) 03/11/2023    CALCIUM 9.5 03/11/2023    MG 2.2 03/10/2023    PHOS 4.9 03/10/2023       Lab Results   Component Value Date    BILITOT 0.8 03/11/2023    BILIDIR 0.30 03/07/2023    PROT 7.4 03/11/2023    ALBUMIN 3.3 (L) 03/11/2023    ALT 230 (H) 03/11/2023    AST 150 (H) 03/11/2023    ALKPHOS 622 (H) 03/11/2023    GGT 27 08/27/2020

## 2023-03-11 NOTE — Unmapped (Addendum)
V/S: soft BPs, within parameters    increased blood sugar, NMA aware, endocrine following, insulin given per sliding scale, patient educated on sugar intake    Neuro: Ox2, follows commands  Musculoskeletal: ambulates contact guard, impulsive, bed alarm activated  GI/GU: appetite increasing this shift, tolerating 75% of breakfast, 25% lunch, dinner ordered prior to shift change  Fall and safety precautions maintained, daughter at bedside. No further needs at this time.     Problem: Adult Inpatient Plan of Care  Goal: Plan of Care Review  Outcome: Progressing  Goal: Patient-Specific Goal (Individualized)  Outcome: Progressing  Flowsheets (Taken 03/10/2023 1709)  Patient/Family-Specific Goals (Include Timeframe): patient will eat 75% of meals by 10/27  Goal: Absence of Hospital-Acquired Illness or Injury  Outcome: Progressing  Intervention: Identify and Manage Fall Risk  Recent Flowsheet Documentation  Taken 03/10/2023 0800 by Isidore Moos, RN  Safety Interventions:   bed alarm   fall reduction program maintained   low bed   nonskid shoes/slippers when out of bed  Intervention: Prevent and Manage VTE (Venous Thromboembolism) Risk  Recent Flowsheet Documentation  Taken 03/10/2023 1600 by Isidore Moos, RN  Anti-Embolism Device Type: SCD, Knee  Anti-Embolism Intervention: Refused  Anti-Embolism Device Location: BLE  Taken 03/10/2023 1400 by Isidore Moos, RN  Anti-Embolism Device Type: SCD, Knee  Anti-Embolism Intervention: Refused  Anti-Embolism Device Location: BLE  Taken 03/10/2023 1200 by Isidore Moos, RN  Anti-Embolism Device Type: SCD, Knee  Anti-Embolism Intervention: Refused  Anti-Embolism Device Location: BLE  Taken 03/10/2023 1000 by Isidore Moos, RN  Anti-Embolism Device Type: SCD, Knee  Anti-Embolism Intervention: Refused  Anti-Embolism Device Location: BLE  Taken 03/10/2023 0800 by Isidore Moos, RN  Anti-Embolism Device Type: SCD, Knee  Anti-Embolism Intervention: Refused  Anti-Embolism Device Location: BLE  Goal: Optimal Comfort and Wellbeing  Outcome: Progressing  Goal: Readiness for Transition of Care  Outcome: Progressing  Goal: Rounds/Family Conference  Outcome: Progressing     Problem: Skin Injury Risk Increased  Goal: Skin Health and Integrity  Outcome: Progressing  Intervention: Optimize Skin Protection  Recent Flowsheet Documentation  Taken 03/10/2023 0800 by Isidore Moos, RN  Head of Bed [facility]) Positioning: HOB at 30-45 degrees     Problem: Self-Care Deficit  Goal: Improved Ability to Complete Activities of Daily Living  Outcome: Progressing     Problem: Fall Injury Risk  Goal: Absence of Fall and Fall-Related Injury  Outcome: Progressing  Intervention: Promote Injury-Free Environment  Recent Flowsheet Documentation  Taken 03/10/2023 0800 by Isidore Moos, RN  Safety Interventions:   bed alarm   fall reduction program maintained   low bed   nonskid shoes/slippers when out of bed     Problem: Mechanical Ventilation Invasive  Goal: Absence of Device-Related Skin and Tissue Injury  Outcome: Progressing

## 2023-03-11 NOTE — Unmapped (Addendum)
Patient throughout shift was Aox4 with no neuro changes. Vital signs stable and one report of pain. Prn tylenol given during shift. No falls or skin changes to report during shift. Call bell within reach. Reviewed safety precautions with patient. Shower and linen change completed during shift. BG elevated overnight, MD notified. Scheduled insulin given.   Problem: Adult Inpatient Plan of Care  Goal: Plan of Care Review  Outcome: Progressing  Goal: Patient-Specific Goal (Individualized)  Outcome: Progressing  Flowsheets (Taken 03/11/2023 0235)  Patient/Family-Specific Goals (Include Timeframe): patient will eat 50% of each meal by 10/27  Goal: Absence of Hospital-Acquired Illness or Injury  Outcome: Progressing  Intervention: Identify and Manage Fall Risk  Recent Flowsheet Documentation  Taken 03/10/2023 2000 by Christena Flake, RN  Safety Interventions:   bed alarm   low bed   nonskid shoes/slippers when out of bed   fall reduction program maintained  Intervention: Prevent and Manage VTE (Venous Thromboembolism) Risk  Recent Flowsheet Documentation  Taken 03/11/2023 0000 by Christena Flake, RN  Anti-Embolism Intervention: Refused  Taken 03/10/2023 2200 by Christena Flake, RN  Anti-Embolism Intervention: Refused  Taken 03/10/2023 2000 by Christena Flake, RN  Anti-Embolism Intervention: Refused  Goal: Optimal Comfort and Wellbeing  Outcome: Progressing  Goal: Readiness for Transition of Care  Outcome: Progressing  Goal: Rounds/Family Conference  Outcome: Progressing     Problem: Skin Injury Risk Increased  Goal: Skin Health and Integrity  Outcome: Progressing     Problem: Self-Care Deficit  Goal: Improved Ability to Complete Activities of Daily Living  Outcome: Progressing     Problem: Fall Injury Risk  Goal: Absence of Fall and Fall-Related Injury  Outcome: Progressing  Intervention: Promote Injury-Free Environment  Recent Flowsheet Documentation  Taken 03/10/2023 2000 by Christena Flake, RN  Safety Interventions:   bed alarm   low bed   nonskid shoes/slippers when out of bed   fall reduction program maintained     Problem: Mechanical Ventilation Invasive  Goal: Absence of Device-Related Skin and Tissue Injury  Outcome: Progressing

## 2023-03-11 NOTE — Unmapped (Signed)
 Additional order received and the patient is already on the caseload. Continue with plan of care.

## 2023-03-11 NOTE — Unmapped (Signed)
Adult Nutrition Assessment Note    Visit Type: MD Consult  Reason for Visit: Assessment (Nutrition)    NUTRITION INTERVENTIONS and RECOMMENDATION     Continue Regular (least restrictive) diet to promote adequate intakes-  with consistencies per SLP   Continue calorie count to better assess adequacy of PO intakes   Will add Nepro TID   Weigh patient 2-3x weekly.       NUTRITION ASSESSMENT     Current nutrition therapy is appropriate and progressing toward meeting nutritional needs at this time.   Patient would benefit from start of oral supplement to better meet nutritional needs.      NUTRITIONALLY RELEVANT DATA     HPI & PMH:   Amy [name] is a 62 y.o. female with PMH ESRD, T2DM admitted to NSICU with a diagnosis of altered mental status.     Nutrition Progress: diet advanced to regular 10/23, per percentage of meal intakes compared to meals ordered in Computrition patient consumed 275 kcal and 20g protein yesterday (10/24) and has consumed 1086 kcal and 41 g protein so far today.      Nutrition History:   Visited patient at bedside. Patient very drowsy. Daughter reports patient last ate yesterday. Patient has been eating well recently with good appetite. No weight loss per daughter. Per chart, weight stable for at least past 4 months. Patient avoids bananas in setting of ESRD.   MD reports plan for SLP evaluation as able in setting of drowsiness.    Medications:  Nutritionally pertinent medications reviewed and evaluated for potential food and/or medication interactions.  Norepinephrine paused;     Labs:   Nutritionally pertinent labs reviewed and include K+: 5.1 mmol/L  BG range from 52 -226 over the last 24 hours (diet liberalized to Regular today)     Nutritional Needs:   Daily Estimated Nutrient Needs:  Energy: 1300-1690 kcals Per [place]-Jeor Equation using admission body weight, 70 kg (03/08/23 1345)]  Protein: 84-105 gm [1.2-1.5 gm/kg using admission body weight, 70 kg (03/08/23 1345)]  Carbohydrate: [no restriction]  Fluid:   mL [per MD team]     Malnutrition Assessment:  Malnutrition Assessment using AND/ASPEN or GLIM Clinical Characteristics:    Patient does not meet AND/ASPEN criteria for malnutrition at this time (03/08/23 1350)               Nutrition Focused Physical Exam:  Nutrition Focused Physical Exam:                        Nutrition Evaluation  Overall Impressions: Nutrition-Focused Physical Exam not indicated due to lack of malnutrition risk factors. (03/08/23 1350)     Care plan:  Not completed, patient does not meet malnutrition criteria     Current Nutrition:  Oral intake   Nutrition Orders            Nutrition Therapy Regular/House starting at 10/25 1037            Nutritionally Pertinent Allergies, Intolerances, Sensitivities, and/or Cultural/Religious Restrictions:  none identified at this time     Anthropometric Data:  Height: 169 cm (5' 6.54)   Admission weight: 68 kg (150 lb)  Last recorded weight: 70.4 kg (155 lb 3.3 oz)  Date of last recorded weight: 03/07/23  IBW: 60.22 kg  BMI: Body mass index is 24.65 kg/m??.   Usual Body Weight: Unable to obtain at this time   Per documented weights PTA, weight relatively stable x4 months PTA  from June to October 2024.     GOALS and EVALUATION     Patient to meet 75% or greater of nutritional needs via combination of meals, snacks, and/or oral supplements within admit.  - New  Patient to remain NPO and/or on a clear liquid diet less than 7 days before diet advancement. - Met/No longer indicated    Motivation, Barriers, and Compliance:  Evaluation of motivation, barriers, and compliance pending at this time due to clinical status.     Discharge Planning:   Monitor for potential discharge needs with multi-disciplinary team.          Follow-Up Parameters:   1-2 times per week (and more frequent as indicated)    Gladys Damme MS, RD, LD, CNSC  [phone]

## 2023-03-11 NOTE — Unmapped (Incomplete)
To place a referral for [facility] Outpatient Oncology Palliative Care: - Order in discharge med rec: Ambulatory referral to Palliative Care - Class: Internal referral - Department Specialty: Palliative Medicine - To Department: [facility] Palliative Care Oncology [facility] - To provider: leave blank - C

## 2023-03-11 NOTE — Unmapped (Signed)
Oncology Consult Note    Requesting Attending Physician :  Morene Antu, DO  Service Requesting Consult : Neurology (NEU)  Reason for Consult: metastatic pancreatic cancer, admitted with new stroke, seizures, possible brain mets  Primary Oncologist: Dr. Rozell Searing    Assessment: Amy [name] is a 62 y.o. female with history of Alport syndrome, ESRD (removed from transplant list given pancreatic cancer), pancreatic cancer with known liver mets who was admitted for stroke with seizure inpatient, concern for brain mets. Oncology was consulted for evaluation of metastatic pancreatic cancer.     Patient mental status largely improved. Discussed current prognosis of cancer. Patient discussed goal of wanting to spend time with grandchildren. Discussed concern that if cancer directed therapy offered, will be complicated by additional appointments including with physicians, staff, infusion. Also discussed likely side effects of current planned regimen all of which may take time away from family versus focusing on comfort at this time and spending time with family and managing effects due to cancer. Discussed hospice with patient and daughter and they want to think it over. Also discussed reasonable to keep currently appointments, and discuss overall care with primary oncologist in outpatient setting, for which already has appointments set up. Discussed that if port placement deferred at this time, would take some time to get rescheduled but ok to hold off, especially as patient and family are strongly considering pursuing comfort based approach.     Plan:  -Patient and daughter to further consider hospice.   -Reasonable to discharge patient as planned and follow up in onc clinic for continued GOC and, if appropriate and in line with patient wishes, pursue hospice from outpatient setting.     This patient has been discussed with Dr. Dorna Mai. These recommendations were discussed with the primary team.     Please contact the oncology consult fellow at [phone] with any further questions.    Stark Bray MD, PhD PGY3  Hematology/Oncology Fellow

## 2023-03-12 LAB — COMPREHENSIVE METABOLIC PANEL
ALBUMIN: 3 g/dL — ABNORMAL LOW (ref 3.4–5.0)
ALKALINE PHOSPHATASE: 517 U/L — ABNORMAL HIGH (ref 46–116)
ALT (SGPT): 191 U/L — ABNORMAL HIGH (ref 10–49)
ANION GAP: 8 mmol/L (ref 5–14)
AST (SGOT): 91 U/L — ABNORMAL HIGH (ref <=34)
BILIRUBIN TOTAL: 0.9 mg/dL (ref 0.3–1.2)
BLOOD UREA NITROGEN: 65 mg/dL — ABNORMAL HIGH (ref 9–23)
BUN / CREAT RATIO: 15
CALCIUM: 9.8 mg/dL (ref 8.7–10.4)
CHLORIDE: 113 mmol/L — ABNORMAL HIGH (ref 98–107)
CO2: 19 mmol/L — ABNORMAL LOW (ref 20.0–31.0)
CREATININE: 4.33 mg/dL — ABNORMAL HIGH
EGFR CKD-EPI (2021) FEMALE: 11 mL/min/{1.73_m2} — ABNORMAL LOW (ref >=60)
GLUCOSE RANDOM: 191 mg/dL — ABNORMAL HIGH (ref 70–179)
POTASSIUM: 5.4 mmol/L — ABNORMAL HIGH (ref 3.4–4.8)
PROTEIN TOTAL: 6.9 g/dL (ref 5.7–8.2)
SODIUM: 140 mmol/L (ref 135–145)

## 2023-03-12 MED ORDER — INSULIN LISPRO (U-100) 100 UNIT/ML SUBCUTANEOUS SOLUTION
Freq: Three times a day (TID) | SUBCUTANEOUS | 0 refills | 42.00000 days | Status: CP
Start: 2023-03-12 — End: 2023-04-11

## 2023-03-12 MED ORDER — NICOTINE (POLACRILEX) 4 MG BUCCAL LOZENGE
BUCCAL | 0 refills | 6.00000 days | Status: CP | PRN
Start: 2023-03-12 — End: 2023-04-11
  Filled 2023-03-12: qty 144, 6d supply, fill #0

## 2023-03-12 MED ORDER — SEMGLEE (INSULIN GLARGINE-YFGN) PEN 100 UNIT/ML (3 ML) SUBCUTANEOUS
Freq: Every day | SUBCUTANEOUS | 0 refills | 30.00000 days | Status: CP
Start: 2023-03-12 — End: 2023-04-11

## 2023-03-12 MED ORDER — LANCETS 33 GAUGE
0 refills | 0.00000 days | Status: CP
Start: 2023-03-12 — End: ?
  Filled 2023-03-12: qty 100, 25d supply, fill #0

## 2023-03-12 MED ORDER — BLOOD GLUCOSE TEST STRIPS
ORAL_STRIP | 0 refills | 0.00000 days | Status: CP
Start: 2023-03-12 — End: ?

## 2023-03-12 MED ORDER — BLOOD-GLUCOSE METER KIT WRAPPER
0 refills | 0.00000 days | Status: CP
Start: 2023-03-12 — End: 2024-03-10
  Filled 2023-03-12: qty 1, 1d supply, fill #0

## 2023-03-12 MED ORDER — INSULIN SYRINGE U-100 WITH NEEDLE 1 ML 31 GAUGE X 5/16" (8 MM)
0 refills | 0.00000 days | Status: CP
Start: 2023-03-12 — End: ?
  Filled 2023-03-12: qty 15, 30d supply, fill #0

## 2023-03-12 MED ORDER — PEN NEEDLE, DIABETIC 32 GAUGE X 5/32" (4 MM)
ORAL | 0 refills | 0.00000 days | Status: CP
Start: 2023-03-12 — End: ?

## 2023-03-12 MED ORDER — ATORVASTATIN 40 MG TABLET
ORAL_TABLET | Freq: Every day | ORAL | 0 refills | 30.00000 days | Status: CP
Start: 2023-03-12 — End: 2023-04-11
  Filled 2023-03-12: qty 30, 30d supply, fill #0

## 2023-03-12 MED ORDER — CLOPIDOGREL 75 MG TABLET
ORAL_TABLET | Freq: Every day | ORAL | 0 refills | 30.00000 days | Status: CP
Start: 2023-03-12 — End: 2023-04-11
  Filled 2023-03-12: qty 30, 30d supply, fill #0

## 2023-03-12 MED ORDER — INSULIN LISPRO (U-100) 100 UNIT/ML SUBCUTANEOUS PEN
SUBCUTANEOUS | 0 refills | 30.00000 days | Status: CP
Start: 2023-03-12 — End: 2023-04-11

## 2023-03-12 MED ORDER — OXYCODONE 5 MG TABLET
ORAL_TABLET | Freq: Every day | ORAL | 0 refills | 10.00000 days | Status: CP | PRN
Start: 2023-03-12 — End: 2023-03-12
  Filled 2023-03-12: qty 10, 10d supply, fill #0

## 2023-03-12 MED ORDER — SODIUM ZIRCONIUM CYCLOSILICATE 10 GRAM ORAL POWDER PACKET
PACK | Freq: Every day | ORAL | 0 refills | 30.00000 days | Status: CP
Start: 2023-03-12 — End: 2023-03-12

## 2023-03-12 MED ADMIN — atorvastatin (LIPITOR) tablet 40 mg: 40 mg | ORAL | @ 13:00:00 | Stop: 2023-03-12

## 2023-03-12 MED ADMIN — insulin lispro (HumaLOG) injection 0-6 Units: 0-6 [IU] | SUBCUTANEOUS | @ 13:00:00 | Stop: 2023-03-12

## 2023-03-12 MED ADMIN — insulin lispro (HumaLOG) injection 0-6 Units: 0-6 [IU] | SUBCUTANEOUS | @ 01:00:00

## 2023-03-12 MED ADMIN — heparin (porcine) 5,000 unit/mL injection 5,000 Units: 5000 [IU] | SUBCUTANEOUS | @ 09:00:00 | Stop: 2023-03-12

## 2023-03-12 MED ADMIN — oxyCODONE (ROXICODONE) immediate release tablet 5 mg: 5 mg | ORAL | @ 13:00:00 | Stop: 2023-03-12

## 2023-03-12 MED ADMIN — acetaminophen (TYLENOL) tablet 650 mg: 650 mg | ORAL | @ 19:00:00 | Stop: 2023-03-12

## 2023-03-12 MED ADMIN — clopidogrel (PLAVIX) tablet 75 mg: 75 mg | ORAL | @ 13:00:00 | Stop: 2023-03-12

## 2023-03-12 MED ADMIN — calcium carbonate (TUMS) chewable tablet 200 mg elem calcium: 200 mg | ORAL | @ 01:00:00

## 2023-03-12 MED ADMIN — melatonin tablet 3 mg: 3 mg | ORAL | @ 01:00:00

## 2023-03-12 MED ADMIN — hydrOXYzine (ATARAX) tablet 25 mg: 25 mg | ORAL | @ 02:00:00

## 2023-03-12 MED ADMIN — insulin lispro (HumaLOG) injection 0-6 Units: 0-6 [IU] | SUBCUTANEOUS | @ 17:00:00 | Stop: 2023-03-12

## 2023-03-12 MED ADMIN — insulin glargine (LANTUS) injection 20 Units: 20 [IU] | SUBCUTANEOUS | @ 02:00:00

## 2023-03-12 MED ADMIN — calcium carbonate (TUMS) chewable tablet 200 mg elem calcium: 200 mg | ORAL | @ 19:00:00 | Stop: 2023-03-12

## 2023-03-12 MED ADMIN — ondansetron (ZOFRAN-ODT) disintegrating tablet 4 mg: 4 mg | ORAL | @ 02:00:00

## 2023-03-12 MED ADMIN — heparin (porcine) 5,000 unit/mL injection 5,000 Units: 5000 [IU] | SUBCUTANEOUS | @ 17:00:00 | Stop: 2023-03-12

## 2023-03-12 MED ADMIN — ondansetron (ZOFRAN-ODT) disintegrating tablet 4 mg: 4 mg | ORAL | @ 13:00:00 | Stop: 2023-03-12

## 2023-03-12 MED ADMIN — nicotine (NICODERM CQ) 14 mg/24 hr patch 1 patch: 1 | TRANSDERMAL | @ 13:00:00 | Stop: 2023-03-12

## 2023-03-12 MED ADMIN — sodium zirconium cyclosilicate (LOKELMA) packet 10 g: 10 g | ORAL | @ 18:00:00 | Stop: 2023-03-12

## 2023-03-12 MED ADMIN — calcium carbonate (TUMS) chewable tablet 200 mg elem calcium: 200 mg | ORAL | @ 13:00:00 | Stop: 2023-03-12

## 2023-03-12 MED ADMIN — diphenhydrAMINE (BENADRYL) capsule/tablet 25 mg: 25 mg | ORAL | @ 18:00:00 | Stop: 2023-03-12

## 2023-03-12 MED ADMIN — heparin (porcine) 5,000 unit/mL injection 5,000 Units: 5000 [IU] | SUBCUTANEOUS | @ 02:00:00

## 2023-03-12 MED FILL — NICOTINE 7 MG/24 HR DAILY TRANSDERMAL PATCH: TRANSDERMAL | 28 days supply | Qty: 28 | Fill #0

## 2023-03-12 MED FILL — ONETOUCH VERIO TEST STRIPS: 25 days supply | Qty: 100 | Fill #0

## 2023-03-12 NOTE — Unmapped (Signed)
Endocrine Team Diabetes Follow Up Consult Note     Consult information:  Requesting Attending Physician : Morene Antu, DO  Service Requesting Consult : Neurology (NEU)  Primary Care Provider: [name], MD  Impression:  [name] is a 62 y.o. 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 We have been consulted at the request of [facility], DO to evaluate [place] for hypoglycemia.     Medical Decision Making:  Diagnoses:  Type 2 Diabetes. Uncontrolled With severe hyperglycemia last 24 hours.  Severe hypoglycemia during seizure  Nutrition: Complicating glycemic control. Increasing risk for both hypoglycemia and hyperglycemia.  Chronic Kidney Disease. Complicating glycemic control and increasing risk for hypoglycemia.  Metastatic pancreatic adenocarcinoma  Ischemic stroke    Studies reviewed 03/12/23:  Labs: CBC, BMP, and POCT-BG  Interpretation: Severe hyperglycemia overnight.  Notes reviewed: Primary team    Overall impression based on above reviews and history:  Patient recalls taking Semglee 100 units daily and Novolog 30 units TID AC at home. This is significantly higher than her calculated weight-based total daily dose. This most likely was the etiology for severe hypoglycemia on presentation.    Severe hyperglycemia. Fasting above goal. Will increase insulin.    Recommendations:  Increase Glargine to 25 units  Increase lispro 6 to 8 units TID AC  Lispro CF ISF 50>140  Recommend diabetes education consult, patient was not consistently checking fingersticks prior to giving insulin at home  Hypoglycemia protocol.  POCT-BG achs.  Ensure patient is on glucose precautions if patient taking nutrition by mouth.   Endocrinology will continue to follow.    Discharge planning:  Semglee 25 units daily  Novolog 8 units TID AC  Sliding scale ISF 50, target 140.   Please include following in patient's discharge paperwork:  Long-acting: Semglee 25 units once daily  Short-acting: Novolog 8 units three times daily before meals, remember to give 10-15 minutes before your meal  Correction factor: In addition to the mealtime regimen above, add 1 unit of Novolog insulin for pre-meal blood sugars every 50 above 150, using the scale below:  70-150: no additional units  151-200: 1 unit  201-250: 2 units  251-300: 3 units  301-350: 4 units  350+: 5 units  Patient can follow-up with her PCP    Thank you for this consult. Discussed plan with primary team.    Please page with questions or concerns: Endocrine Fellow: [phone]    Elliot Gurney, MD  Endocrinology Fellow PGY-4    Subjective:  Interval History:  Patient's mentation improved today. Reports she was taking Semglee 100 units daily and Novolog 30 units TID AC at home.    Initial HPI:  Amy [name] is a 62 y.o. 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 Of note, during a seizure event in the ED, she was found to be hypoglycemic to 18, necessitating dextrose.  She is on short- and long-acting insulin at home, although the exact doses are unknown as patient not able to communicate this due to AMS. Her diabetes is managed by a provider at an outside network, per her daughter--unable to see this records in EMR. Patient's daughter believes she last took her insulin prior to presenting to the ED.     Diabetes History:  Patient has a history of Type 2 diabetes   Follows:  PCP  Current home diabetes regimen: Semglee 100 units daily, Novolog 30 units TID AC  Current home blood glucose monitoring: fingersticks, but inconsistent. Was about to start Dexcom before admission.  Hypoglycemia awareness: unknown  Complications related to diabetes: chronic kidney disease 2/2 Alport, but DM2 likely contributing       Current Nutrition:  Active Orders   Diet    Nutrition Therapy Regular/House       ROS: As per HPI.     atorvastatin  40 mg Oral Daily    clopidogrel  75 mg Oral Daily    EPINEPHrine        heparin (porcine) for subcutaneous use  5,000 Units Subcutaneous Q8H SCH    insulin glargine  20 Units Subcutaneous Nightly    insulin lispro  0-6 Units Subcutaneous ACHS    insulin lispro  8 Units Subcutaneous 3xd Meals    lactated ringers  500 mL Intravenous Once    melatonin  3 mg Oral Nightly    midazolam (PF)        nicotine  1 patch Transdermal Daily    Followed by    Melene Muller ON 03/15/2023] nicotine  1 patch Transdermal Daily    polyethylene glycol  17 g Oral Daily    senna  2 tablet Oral Nightly       Current Outpatient Medications   Medication Instructions    aspirin (ECOTRIN) 81 mg, Oral, Daily (standard)    AURYXIA 210 mg, Oral, 3 times a day (with meals)    blood sugar diagnostic (TRUE METRIX GLUCOSE TEST STRIP) Strp True Metrix Glucose Test Strip    cholecalciferol (vitamin D3-25 mcg (1,000 unit)) 1,000 Units, Oral, Daily (standard)    clobetasol (TEMOVATE) 0.05 % external solution Topical, 2 times a day (standard), To scalp    clobetasol (TEMOVATE) 0.05 % ointment Apply to your thick psoriasis areas twice daily until clear    cyclobenzaprine (FLEXERIL) 10 mg, Oral, Nightly    empagliflozin (JARDIANCE) 10 mg, Oral, Daily (standard)    lisinopril (PRINIVIL,ZESTRIL) 10 mg, Oral, Daily (standard)    nicotine (NICODERM CQ) 14 mg/24 hr patch 1 patch, Transdermal, Every 24 hours, Remove old patch before applying new patch on skin. After 8 weeks, switch to 7 mg patches.    nicotine (NICODERM CQ) 21 mg/24 hr patch 1 patch, Transdermal, Every 24 hours, Remove old patch before applying new patch on skin. After 8 weeks, switch to 14 mg patches.    nicotine (NICODERM CQ) 7 mg/24 hr patch 1 patch, Transdermal, Every 24 hours    NovoLOG Flexpen U-100 Insulin 30 Units, 3 times a day (standard)    omeprazole (PRILOSEC) 20 mg, Oral, Daily (standard)    oxyCODONE (ROXICODONE) 5 mg, Oral, Every 4 hours PRN    pen needle, diabetic 32 gauge x 5/32 (4 mm) Ndle Injection, 3 times a day (AC)    pregabalin (LYRICA) 50 MG capsule No dose, route, or frequency recorded.    rosuvastatin (CRESTOR) 10 mg, Oral, Daily (standard)    SEMGLEE,INSULIN GLARG-YFGN,PEN 100 unit/mL (3 mL) InPn Inject 50 Units under the skin two (2) times a day.    sodium bicarbonate 1,300 mg, Oral, 2 times a day (standard)    TRUEPLUS PEN NEEDLE 32 gauge x 5/32 Ndle No dose, route, or frequency recorded.    ustekinumab (STELARA) 45 mg/0.5 mL Syrg syringe Inject the contents of 1 syringe (45 mg) under the skin once every 12 weeks.  Past Medical History:   Diagnosis Date    Alport syndrome     Back pain     Chronic kidney disease     Dental abscess     Diabetes mellitus (CMS-HCC)     type 2, don't remember A1C, controlled with insulin injections; okay per physician at last visit    Hyperlipidemia     Hypertension     well controlled with meds    Kidney stone     no problems since 1995    Psoriasis        Past Surgical History:   Procedure Laterality Date    BREAST BIOPSY Right 05/30/2018    Intraductal papilloma w/ florid DH & apocrine metaplasia    CATARACT EXTRACTION Right 08/29/2017    COLONOSCOPY      cyst removal in breast      HYSTERECTOMY      INSERT ARTERIAL LINE  03/08/2023    NEPHROSTOMY      OOPHORECTOMY      ONLY ONE OVARY REMOVED    PR COLSC FLX W/RMVL OF TUMOR POLYP LESION SNARE TQ N/A 06/19/2018    Procedure: COLONOSCOPY FLEX; W/REMOV TUMOR/LES BY SNARE;  Surgeon: Jarvis Morgan, MD;  Location: [facility];  Service: Gastroenterology    PR XCAPSL CTRC RMVL INSJ IO LENS PROSTH W/O ECP Right 08/29/2017    Procedure: EXTRACAPSULAR CATARACT REMOVAL W/INSERTION OF INTRAOCULAR LENS PROSTHESIS, MANUAL OR MECHANICAL TECHNIQUE Lens Choice:OD ZCB00 +19.50 [id number] +19.00/+18.00;  Surgeon: O'Rese Bonney Roussel, MD;  Location: [facility];  Service: Ophthalmology    PR XCAPSL CTRC RMVL INSJ IO LENS PROSTH W/O ECP Left 09/19/2017    Procedure: EXTRACAPSULAR CATARACT REMOVAL W/INSERTION OF INTRAOCULAR LENS PROSTHESIS, MANUAL OR MECHANICAL TECHNIQUE Lens Choice: ZCB00 +21.00; ZA9003 +20.00/+19.00;  Surgeon: O'Rese Bonney Roussel, MD;  [facility]: [facility];  Service: Ophthalmology       Family History   Problem Relation Age of Onset    Kidney disease Mother     Diabetes Mother     Cataracts Mother     Heart attack Father     No Known Problems Daughter     Cancer Paternal Grandmother     No Known Problems Other     Breast cancer Neg Hx     Colon cancer Neg Hx     Endometrial cancer Neg Hx     Ovarian cancer Neg Hx     Melanoma Neg Hx     Basal cell carcinoma Neg Hx     Squamous cell carcinoma Neg Hx     BRCA 1/2 Neg Hx        Social History     Tobacco Use    Smoking status: Former     Current packs/day: 0.00     Average packs/day: 1 pack/day for 43.0 years (43.0 ttl pk-yrs)     Types: Cigarettes     Start date: 02/12/1978     Quit date: 02/12/2021     Years since quitting: 2.0    Smokeless tobacco: Never   Vaping Use    Vaping status: Every Day   Substance Use Topics    Alcohol use: No    Drug use: No       OBJECTIVE:  BP 103/48  - Pulse 66  - Temp 37 ??C (98.6 ??F) (Oral)  - Resp 16  - Ht 169 cm (5' 6.54)  - Wt 65.6 kg (144 lb 10 oz)  -  SpO2 97%  - BMI 22.97 kg/m??   Wt Readings from Last 12 Encounters:   03/11/23 65.6 kg (144 lb 10 oz)   02/15/23 69.9 kg (154 lb)   02/01/23 70.7 kg (155 lb 12.8 oz)   12/18/22 67.1 kg (148 lb)   10/26/22 70.3 kg (155 lb)   07/27/22 70.3 kg (155 lb)   04/27/22 70.4 kg (155 lb 4.8 oz)   02/09/22 72.9 kg (160 lb 11.2 oz)   12/15/21 75.8 kg (167 lb 3.2 oz)   09/15/21 75.5 kg (166 lb 8 oz)   07/21/21 75.1 kg (165 lb 9.6 oz)   05/26/21 76.9 kg (169 lb 8 oz)     Physical Exam:  General: female in no apparent distress  HEENT: EOMI, sclera anicteric  Respiratory: No increased work of breathing  Neuro: Awake, alert  Psych: Normal mood and affect  Skin: No abnormal skin pigmentation    BG/insulin reviewed per EMR.   Glucose, POC (mg/dL)   Date Value   09/81/1914 227 (H)   03/11/2023 314 (H)   03/11/2023 292 (H)   03/11/2023 335 (H)   03/11/2023 251 (H)   03/11/2023 343 (H)   03/10/2023 409 (HH)   03/10/2023 397 (H)        Summary of labs:  Lab Results   Component Value Date    A1C 7.5 (H) 02/15/2023    A1C 7.3 (H) 02/09/2023    A1C 6.2 (H) 12/14/2021     Lab Results   Component Value Date    CREATININE 4.33 (H) 03/12/2023     Lab Results   Component Value Date    WBC 7.0 03/10/2023    HGB 11.5 03/10/2023    HCT 35.5 03/10/2023    PLT 66 (L) 03/10/2023       Lab Results   Component Value Date    NA 140 03/12/2023    K 5.4 (H) 03/12/2023    CL 113 (H) 03/12/2023    CO2 19.0 (L) 03/12/2023    BUN 65 (H) 03/12/2023    CREATININE 4.33 (H) 03/12/2023    GLU 191 (H) 03/12/2023    CALCIUM 9.8 03/12/2023    MG 2.2 03/10/2023    PHOS 4.9 03/10/2023       Lab Results   Component Value Date    BILITOT 0.9 03/12/2023    BILIDIR 0.30 03/07/2023    PROT 6.9 03/12/2023    ALBUMIN 3.0 (L) 03/12/2023    ALT 191 (H) 03/12/2023    AST 91 (H) 03/12/2023    ALKPHOS 517 (H) 03/12/2023    GGT 27 08/27/2020

## 2023-03-12 NOTE — Unmapped (Signed)
NMA (Neurology Team A)  Physician Discharge Summary        Admit date: 03/07/2023    Discharge date: 03/12/2023    Discharge to: Home    Discharge Service: NEU Neurology Team A (NMA)    Discharge Physician: Morene Antu, DO    Discharge Diagnoses:   Primary discharge diagnosis: provoked seizures secondary tp hypoglycemia (POA)  Secondary discharge diagnoses: metastatic pancreatic cancer with metastases to liver    Follow-up Issues:   You were evaluated for mild cognitive changes. Neurology was consulted with the following recommendations:  Continue Plavix  Continue home rosuvastatin  Start your new diabetes regimen as listed in your Discharge Instructions.  Follow up with [facility] Neurology (Dr Atha Starks) on 03/10/2023  Will need repeat MRI brain in 6-8 weeks for enhancement in left postcentral gyrus  Follow-up outpatient with [facility], Dr. Rozell Searing, as scheduled on 03/15/2023.  Follow-up outpatient with [facility] Palliative care. They will contact the patient to schedule. If you do not hear from them, call [phone] and ask to be sent to the Palliative Care clinic at [facility].  Follow-up outpatient with [facility] Nephrology, Dr. Cherly Hensen, as scheduled on 03/28/2023.     Procedures: none    Consults: Palliative Care, Endocrine    Pertinent Test Results:   Risk Stratification:   Cholesterol (mg/dL)   Date Value   09/81/1914 138     Triglycerides (mg/dL)   Date Value   78/29/5621 208 (H)   03/07/2023 208 (H)     HDL (mg/dL)   Date Value   30/86/5784 58     LDL Calculated (mg/dL)   Date Value   69/62/9528 38 (L)     TSH (uIU/mL)   Date Value   03/07/2023 0.569     Hemoglobin A1C (%)   Date Value   02/15/2023 7.5 (H)        All Labs Last 24hrs:   Recent Results (from the past 24 hour(s))   POCT Glucose    Collection Time: 03/11/23  4:23 PM   Result Value Ref Range    Glucose, POC 314 (H) 70 - 179 mg/dL   POCT Glucose    Collection Time: 03/11/23  8:50 PM   Result Value Ref Range    Glucose, POC 227 (H) 70 - 179 mg/dL   Comprehensive Metabolic Panel    Collection Time: 03/12/23  4:58 AM   Result Value Ref Range    Sodium 140 135 - 145 mmol/L    Potassium 5.4 (H) 3.4 - 4.8 mmol/L    Chloride 113 (H) 98 - 107 mmol/L    CO2 19.0 (L) 20.0 - 31.0 mmol/L    Anion Gap 8 5 - 14 mmol/L    BUN 65 (H) 9 - 23 mg/dL    Creatinine 4.13 (H) 0.55 - 1.02 mg/dL    BUN/Creatinine Ratio 15     eGFR CKD-EPI (2021) Female 11 (L) >=60 mL/min/1.41m2    Glucose 191 (H) 70 - 179 mg/dL    Calcium 9.8 8.7 - 24.4 mg/dL    Albumin 3.0 (L) 3.4 - 5.0 g/dL    Total Protein 6.9 5.7 - 8.2 g/dL    Total Bilirubin 0.9 0.3 - 1.2 mg/dL    AST 91 (H) <=01 U/L    ALT 191 (H) 10 - 49 U/L    Alkaline Phosphatase 517 (H) 46 - 116 U/L   POCT Glucose    Collection Time: 03/12/23  9:01 AM   Result Value Ref Range  Glucose, POC 192 (H) 70 - 179 mg/dL   POCT Glucose    Collection Time: 03/12/23  1:04 PM   Result Value Ref Range    Glucose, POC 232 (H) 70 - 179 mg/dL   ECG 12 Lead    Collection Time: 03/12/23  1:18 PM   Result Value Ref Range    EKG Systolic BP  mmHg    EKG Diastolic BP  mmHg    EKG Ventricular Rate 61 BPM    EKG Atrial Rate 61 BPM    EKG P-R Interval 156 ms    EKG QRS Duration 78 ms    EKG Q-T Interval 406 ms    EKG QTC Calculation 408 ms    EKG Calculated P Axis 74 degrees    EKG Calculated R Axis -13 degrees    EKG Calculated T Axis 8 degrees    QTC Fredericia 408 ms     Risk Stratification:   Cholesterol (mg/dL)   Date Value   16/02/9603 138     Triglycerides (mg/dL)   Date Value   54/01/8118 208 (H)   03/07/2023 208 (H)     HDL (mg/dL)   Date Value   14/78/2956 58     LDL Calculated (mg/dL)   Date Value   21/30/8657 38 (L)     TSH (uIU/mL)   Date Value   03/07/2023 0.569     Hemoglobin A1C (%)   Date Value   02/15/2023 7.5 (H)     Hypercoagulable work-up:   Dilute Viper Venom Time   Date Value Ref Range Status   08/27/2020 46.0 0.0 - 48.6 s Final     APTT Lupus Anticoagulant   Date Value Ref Range Status   08/27/2020 37.2 0.0 - 47.8 s Final     IgG trend: No results found for: IGG  Myasthenic Autoimmune Labs: No results found for: CAPQ, CABINDABN, STMAB, LABACHR, ACHRABMOD  Myasthenia Monitoring Labs:   Lab Results   Component Value Date    WBC 7.0 03/10/2023    WBC 9.2 03/09/2023    WBC 8.4 03/07/2023    HGB 11.5 03/10/2023    HGB 11.3 03/09/2023    HGB 11.1 (L) 03/07/2023    HCT 35.5 03/10/2023    HCT 34.4 03/09/2023    HCT 33.2 (L) 03/07/2023    PLT 66 (L) 03/10/2023    PLT 71 (L) 03/09/2023    PLT 71 (L) 03/07/2023    LYMPHSABS 0.5 (L) 03/07/2023    LYMPHSABS 2.5 03/07/2023    LYMPHSABS 1.4 03/07/2023    MONOSABS 0.6 03/07/2023    MONOSABS 3.3 (H) 03/07/2023    MONOSABS 0.8 03/07/2023    BANDABS 0.0 01/18/2019    LYMPHOPCT 5.8 03/07/2023    LYMPHOPCT 12.4 03/07/2023    LYMPHOPCT 17.2 03/07/2023    MONOPCT 7.2 03/07/2023    MONOPCT 16.3 03/07/2023    MONOPCT 9.5 03/07/2023    TSH 0.569 03/07/2023     Last CSF Analysis: No results found for: COLORCSF, APPEARCSF, NUCCELLSCSF, RBCCSF, NCSF, LYMPHSCSF, MONOMACCSF, EOSCSF, OTHERCSF, SPUNA, PROTCSF, GLUCCSF, LACTATECSF  Reversible causes of dementia lab work-up:   TSH (uIU/mL)   Date Value   03/07/2023 0.569     Rapidly Progressive Dementia Labs:   TSH (uIU/mL)   Date Value   03/07/2023 0.569     Inflammatory Markers:   CRP   Date Value Ref Range Status   03/07/2023 37.0 (H) <=10.0 mg/L Final     Autoimmune Encephalopathy, CSF: No  results found for: ENCEST, NMDARECEPTAB, VGKC, GAD65AB, GABASERUM, AMPARECEPTAB, ANNA1, ANNA2, ANNATYPE3, AGNA1, PCATYPE1YO, PCATYPE2, PCATYPETR, AMPHIAB, CABINDABN, CAPQ, LABACHR, GNEUAB, CRMP5IGG  AED Level Trend: No results found for: PHENYTOIN, PHENOBARB, VALPROATE, VPAT, CBMZ, LAMOTRIGINE  JC Virus: No results found for: JCVIRUSAB, JCVIRUSINHIB  Urine Pregnancy Test: No results found for: PREGTESTUR  Hepatitis Screen:   Hep B S Ab   Date Value Ref Range Status   08/27/2020 Nonreactive Nonreactive, Grayzone Final     Comment:     Nonreactive and Grayzone results are considered non-immune.     Hepatitis C Ab   Date Value Ref Range Status   08/27/2020 Nonreactive Nonreactive Final     Comment:     Antibodies to HCV were not detected.  A nonreactive result does not exclude the possibility of exposure to HCV.     Paraproteinemia work-up:   Total Protein   Date Value Ref Range Status   03/12/2023 6.9 5.7 - 8.2 g/dL Final     Rituximab panel: No results found for: LYMPH, RCD3, RCD19, RCDNK, RCD20, RCD5, RCD45  Vitamin levels:   Vitamin D Total (25OH)   Date Value Ref Range Status   10/19/2022 33.0 20.0 - 80.0 ng/mL Final     Copper/Zinc labs:     Heavy Metal Screen, Urine: No results found for: ASMRU, PBMRU, CADRU, HGMRU  Heavy Metal Screen, 24-hour Urine: No results found for: ASMTU, TV3M, CADMIUM24HUR, LTV, HC3M3, TV3M3, CTV  Congenital Disorders of Glycosylation: No results found for: CMDR, CADR, TRIDR, APC12, APC02, CDTI     Relevant Studies/Radiology:  All imaging results since admit ECG 12 Lead    Result Date: 03/12/2023  NORMAL SINUS RHYTHM LOW VOLTAGE QRS CANNOT RULE OUT ANTERIOR INFARCT  , AGE UNDETERMINED ABNORMAL ECG WHEN COMPARED WITH ECG OF 08-Mar-2023 06:45, SINUS RHYTHM HAS REPLACED JUNCTIONAL RHYTHM NONSPECIFIC T WAVE ABNORMALITY NOW EVIDENT IN ANTERIOR LEADS    ECG 12 Lead    Result Date: 03/09/2023  SINUS BRADYCARDIA LOW VOLTAGE QRS NONSPECIFIC ST ABNORMALITY ABNORMAL ECG WHEN COMPARED WITH ECG OF 07-Mar-2023 20:02, PR INTERVAL HAS INCREASED Confirmed by Mariane Baumgarten ([id number]) on 03/09/2023 6:36:24 PM    ECG 12 Lead    Result Date: 03/09/2023  JUNCTIONAL RHYTHM LOW VOLTAGE QRS NONSPECIFIC ST ABNORMALITY ABNORMAL ECG WHEN COMPARED WITH ECG OF 07-Mar-2023 22:43, JUNCTIONAL RHYTHM HAS REPLACED SINUS RHYTHM Confirmed by Mariane Baumgarten ([id number]) on 03/09/2023 12:02:13 PM    ECG 12 Lead    Result Date: 03/09/2023  SINUS RHYTHM WITH SHORT PR INTERVAL LOW VOLTAGE QRS NONSPECIFIC ST AND T WAVE ABNORMALITY ABNORMAL ECG WHEN COMPARED WITH ECG OF 07-Mar-2023 14:30, PR INTERVAL HAS DECREASED T WAVE INVERSION NO LONGER EVIDENT IN INFERIOR LEADS Confirmed by Mariane Baumgarten ([id number]) on 03/09/2023 5:23:44 AM    EEG video monitoring    Result Date: 03/08/2023  Sandrea Hammond, MD     03/08/2023  4:21 PM Patient: Amy [name] Date of Birth: 1960-12-02 Attending: Reece Packer,  M.D. Ordering Provider: Joycelyn Das [facility] No: [id number]  DATE STARTED: 03/08/2023 00:06 DATE ENDED: 03/08/2023 15:35  HISTORY: 62 y.o. right handed female with history of ESRD who presented to the emergency room on 03/07/2023 with concerns of confusion, episodic blurry vision.   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 There was also an evolving right occipital infarct.  While waiting for discharge patient was noted to be somnolent and having incoherent speech.  Patient further declined to sonorous respirations,  had repetitive right upper extremity movements and became obtunded. BMP showed a glucose of 20, and a blood gas showed a glucose of 18.    cEEG to eval sz  PROCEDURE: Continuous EEG with simultaneous video recording was performed utilizing 21 active electrodes placed according to the international 10-20 system.  The study was recorded digitally with a bandpass of 1-70Hz  and a sampling rate of 200Hz  and was reviewed with the possibility of multiple reformatting.  The study was digitally processed with potential spike and seizure events identified for physician analysis and review.  Patient recognized events were identified by a push button marker and reviewed by the physician.   Simultaneous video was reviewed for all patient events.  TECHNICAL DESCRIPTION: Background activities are continuous and initially are comprised of mixed alpha, theta, and delta range frequencies during maximal alertness. Some generalized rhythmic delta activity (GRDA) can be seen at times.  Possible posterior dominant rhythm of 6 cps is seen. There is some mild improvement in the degree of background slowing over the course of the study recording.  State changes/reactivity are present during the study recording. No interictal abnormalities are seen. No electrographic event suggestive of a seizure is seen.  IMPRESSION: This EEG is abnormal secondary to initially moderate diffuse slowing subsequently improving to mild to moderate diffuse slowing.  CLINICAL INTERPRETATION: Diffuse slowing is indicative of bihemispheric dysfunction which could be on the basis of a toxic, metabolic, or primary neuronal disorder.      XR Abdomen 1 View    Result Date: 03/08/2023  EXAM: XR ABDOMEN 1 VIEW ACCESSION: [id number] UN CLINICAL INDICATION: 62 years old with NGT (CATHETER VASCULAR FIT & ADJ)  COMPARISON: None TECHNIQUE: Supine view of the abdomen, 1 image(s) FINDINGS: Esophagogastric tube with tip and sideport projecting over the stomach. Nonobstructive bowel gas pattern in the partially visualized upper abdomen. Degenerative changes of the spine. Lung bases are clear.     Esophagogastric tube with tip and sideport projecting over the stomach.     CTA Head W Contrast    Result Date: 03/07/2023  EXAM: Computed tomographic angiography, head, with contrast material and image postprocessing. DATE: 03/07/2023 9:16 PM ACCESSION: [id number] UN DICTATED: 03/07/2023 9:33 PM INTERPRETATION LOCATION: [facility] CLINICAL INDICATION: 62 years old Female with Code Stroke  COMPARISON: Same-day CTA of the neck; same day MRI TECHNIQUE: CT angiogram of the head during contrast bolus infusion were acquired.  Reformatted sagittal, coronal MIP images are provided. 3D volume rendered images using a stand alone workstation are also provided. FINDINGS: No intracranial hemorrhage or mass. Previously noted regions of DWI restriction have no readily apparent CT correlate. The osseous structures are unremarkable. Debris within the nasopharynx and nasal cavity. Mild calcification of the left greater than right carotid siphons. The major arterial structures are otherwise normal in appearance. No definite stenosis. No aneurysm visualized.      No large vessel occlusion or significant stenosis.     CTA Neck W Contrast    Result Date: 03/07/2023  EXAM: Computed tomographic angiography, neck, with contrast material, including noncontrast images, if performed, and image postprocessing on a stand alone workstation. DATE: 03/07/2023 9:16 PM ACCESSION: [id number] UN DICTATED: 03/07/2023 9:21 PM INTERPRETATION LOCATION: [facility] CLINICAL INDICATION: 62 years old Female with stroke  COMPARISON: Simultaneous CT of the head; cervical spine radiographs 04/13/2020; MRI cervical spine 12/02/2014 TECHNIQUE: Contiguous axial CT angiogram of the neck during contrast bolus infusion were acquired.  Multiplanar reformatted and MIP images were provided. For selected cases, 3D volume  rendered images are also provided. FINDINGS: No evidence of dissection or aneurysm in the carotid or vertebral arteries. Trace calcification in the proximal V2 segment of the left vertebral artery. Tortuous left internal carotid artery. No hemodynamically significant arterial stenosis. Atherosclerotic ossification in the aortic arch. The soft tissues are unremarkable without lymphadenopathy, mass or abscess. Enlarged, multinodular thyroid goiter. Multilevel degenerative changes of the visualized cervical spine. Airway is patent and midline. Endotracheal tube projects over the midthoracic trachea. Cystic changes in the left lung apex. In this report, if stenosis of the internal carotid artery is reported, it is calculated based on the NASCET method: (1 - (minimal residual diameter/normal diameter of distal ICA)).     No evidence of significant stenosis, dissection or aneurysm of the carotid or vertebral arteries.     XR Chest Portable    Result Date: 03/07/2023  EXAM: XR CHEST PORTABLE ACCESSION: [id number] UN CLINICAL INDICATION: intubation ; OTHER  TECHNIQUE: Single View AP Chest Radiograph. COMPARISON: 03/07/2023 and prior FINDINGS: Endotracheal tube terminates approximately 4 cm above the carina. Lungs are clear.  No pleural effusion or pneumothorax. Normal heart size and mediastinal contours.     Endotracheal tube terminates 4 cm above the carina.    ECG 12 Lead    Result Date: 03/07/2023  NORMAL SINUS RHYTHM WITH SINUS ARRHYTHMIA LOW VOLTAGE QRS CANNOT RULE OUT ANTERIOR INFARCT  (CITED ON OR BEFORE 27-Aug-2020) ABNORMAL ECG WHEN COMPARED WITH ECG OF 07-Mar-2023 13:22, QUESTIONABLE CHANGE IN INITIAL FORCES OF SEPTAL LEADS Confirmed by Warnell Forester [phone]) on 03/07/2023 6:52:56 PM    ECG 12 Lead    Result Date: 03/07/2023  NORMAL SINUS RHYTHM WITH SINUS ARRHYTHMIA LOW VOLTAGE QRS CANNOT RULE OUT ANTEROSEPTAL INFARCT (CITED ON OR BEFORE 27-Aug-2020) ABNORMAL ECG WHEN COMPARED WITH ECG OF 10-Feb-2023 08:30, QUESTIONABLE CHANGE IN INITIAL FORCES OF ANTERIOR LEADS T WAVE INVERSION NOW EVIDENT IN INFERIOR LEADS Confirmed by Warnell Forester ([id number]) on 03/07/2023 6:51:37 PM    MRI Brain W Wo Contrast    Result Date: 03/07/2023  EXAM: Magnetic resonance imaging, brain, without and with contrast material. ACCESSION: [id number] UN CLINICAL INDICATION: 62 years old Female with AMS, h/o known R occipital lesion NOS  COMPARISON: MRI of the brain 02/10/2023 TECHNIQUE: Multiplanar, multisequence MR imaging of the brain was performed without and with I.V. contrast. FINDINGS:  Numerous punctate areas of restricted diffusion involving bilateral anterior and posterior circulation (8:48, 8:46, 8:42, 8:39, 8:38, and 8:37 for reference). Evolving right occipital infarct with associated enhancement. Additional scattered areas of T2/FLAIR hyperintensity in the deep white matter periventricular regions, which can be seen in chronic small vessel ischemic disease. No midline shift. Suspect some degree of cerebral atrophy with ex vacuo dilation of the ventricles. No extra-axial fluid collection. No evidence of intracranial hemorrhage. Trace bilateral mastoid effusions. There is a punctate foci of enhancement in the left postcentral gyrus (13:136)     -Numerous punctate areas of restricted diffusion involving multiple vascular territories, suspicious for acute embolic type infarcts. -Evolving right occipital infarct. Many of the previously seen punctate infarcts are less conspicuous. -Foci of enhancement involving the left postcentral gyrus without definitive associated restricted diffusion. Etiology is indeterminate, but recommend follow-up MR in 6 to 8 weeks to exclude underlying metastatic lesion. ++++++++++++++++++++ The findings of this study were discussed via epic messenger, receipt confirmed with DR. Ulla Gallo Migliaccio by Dr. Lucia Estelle on 03/07/2023 4:55 PM. -----------------------------------------------     XR Chest 1 view Portable    Result Date: 03/07/2023  EXAM: XR CHEST  PORTABLE ACCESSION: [id number] UN CLINICAL INDICATION: SHORTNESS OF BREATH  TECHNIQUE: Single View AP Chest Radiograph. COMPARISON: Chest radiograph from 02/10/2023. CT chest from 02/22/2023. FINDINGS: Lungs are clear.  No pleural effusion or pneumothorax. Normal heart size and mediastinal contours.     Negative chest.  , Brain MRI with and w/o contrast Results for orders placed during the hospital encounter of 03/07/23    MRI Brain W Wo Contrast    Impression  -Numerous punctate areas of restricted diffusion involving multiple vascular territories, suspicious for acute embolic type infarcts.    -Evolving right occipital infarct. Many of the previously seen punctate infarcts are less conspicuous.    -Foci of enhancement involving the left postcentral gyrus without definitive associated restricted diffusion. Etiology is indeterminate, but recommend follow-up MR in 6 to 8 weeks to exclude underlying metastatic lesion.  ++++++++++++++++++++    The findings of this study were discussed via epic messenger, receipt confirmed with DR. Ulla Gallo Migliaccio by Dr. Lucia Estelle on 03/07/2023 4:55 PM.    -----------------------------------------------    Signed by: Velva Harman, MD on 03/07/2023  5:19 PM      Results for orders placed during the hospital encounter of 02/09/23    MRI Brain W Wo Contrast    Impression  Evaluation limited by motion artifact. Within this limitation,    Findings concerning for intracranial metastatic disease, most prominently in the right occipital lobe.    The findings of this study were discussed via telephone with DR. Cleone Slim by Dr. Heloise Ochoa on 02/10/2023 6:18 PM.  ====================  ADDENDUM:  (02/10/2023 7:22 PM)  On review, the following additional findings were noted:    Findings more worrisome for subacute infarct with a likely embolic source given distribution.    -----------------------------------------------    Signed by: Krista Blue, MD on 02/10/2023  7:23 PM  , Brain MRI w/o contrast No results found for this or any previous visit.  , MRI/MRA combo No results found for this or any previous visit.  , MRA Brain with and without No results found for this or any previous visit.  , MRA Neck with and without No results found for this or any previous visit.  , CT Head w/o contrast No results found for this or any previous visit.  , CTA Head with contrast Results for orders placed during the hospital encounter of 03/07/23    CTA Head W Contrast    Impression  No large vessel occlusion or significant stenosis.    Signed by: Ritta Slot, MD on 03/07/2023 10:21 PM   No results found for this or any previous visit.  , CTA Neck Results for orders placed during the hospital encounter of 03/07/23    CTA Neck W Contrast    Impression  No evidence of significant stenosis, dissection or aneurysm of the carotid or vertebral arteries.    Signed by: Ritta Slot, MD on 03/07/2023  9:37 PM   No results found for this or any previous visit.  , CTP No results found for this or any previous visit.  , CT Chest with contrast No results found for this or any previous visit.  , CT Chest w/o contrast Results for orders placed during the hospital encounter of 02/22/23    CT Chest Wo Contrast    Impression  -Upper lobe predominant centrilobular groundglass nodules bilaterally, may represent underlying respiratory bronchiolitis.    -Multiple new scattered subcentimeter pulmonary and fissural nodules measuring up to  0.8 cm; indeterminate. Recommendation follow-up CT in 3 months for further assessment.    Signed by: Corena Pilgrim, MD on 02/22/2023 11:27 AM  , CT Abdomen Pelvis contrast No results found for this or any previous visit.  , CT Abdomen Pelvis without Contrast Results for orders placed during the hospital encounter of 02/05/21    CT Abdomen Pelvis Wo Contrast    Impression  Bilateral common iliac arteries with mild atherosclerotic vascular calcification, right greater than left.    Additional chronic and incidental findings as above.    Signed by: Trude Mcburney, MD on 02/05/2021 11:05 AM      Results for orders placed during the hospital encounter of 02/23/20    CT abdomen pelvis without contrast    Impression  -No calculi or hydronephrosis.      ====================  ADDENDUM (02/23/2020 1:56 PM):  Dr.Altun dictating    A 1.6 cm hypodense lesion with mild contour bulging is also identified in the left kidney with fluid attenuation and this may represent a cyst    Signed by: Dagoberto Reef, MD on 02/23/2020  1:57 PM   No results found for this or any previous visit.  , PET No results found for this or any previous visit.  , MRI C-spine with & without Results for orders placed during the hospital encounter of 12/02/14    MRI Cervical Spine Wo Contrast    Impression  Mild multilevel degenerative disc disease in the cervical spine. Mild right neural foraminal narrowing at C5-6 and partially visualized bilateral neural foraminal narrowing at T2-3.    Signed by: Verlee Rossetti, MD on 12/02/2014  4:11 PM  , MRI T-spine with & without No results found for this or any previous visit.  , MRI L-spine with & without No valid procedures specified., MRI C, T, & L spine w/ contrast No results found for this or any previous visit.  , Carotid Dopplers Results for orders placed during the hospital encounter of 02/09/23    PVL Carotid Duplex Bilateral  Signed by: Bascom Levels, MD on 02/10/2023  6:50 PM  , ECHO Results for orders placed during the hospital encounter of 02/05/21    Echocardiogram W Colorflow Spectral Doppler    Narrative  Patient Info  Name:     Amy  [name]  Age:     3 years  DOB:     [birth year]  Gender:     Female  MRN:     [id number]  Accession #:     [id number]  Ht:     170 cm  Wt:     77 kg  BSA:     1.92 m2  Exam Date:     02/05/2021 11:12 AM  [facility] Location:     [facility]  Exam Location:     [facility]  Admit Date:     02/05/2021    Exam Type:     ECHOCARDIOGRAM W COLORFLOW SPECTRAL DOPPLER    Study Info  Indications  - pre kidney transplant    Complete two-dimensional, color flow and Doppler transthoracic echocardiogram  is performed.    Staff  Referring Physician:     Elwyn Lade RUDOLPH ;  Sonographer:     Roylene Reason, RDCS  Ordering Physician:     Elwyn Lade RUDOLPH    Account #:     [id number]      Summary  1. The left ventricle is normal in size with normal wall thickness.  2. The  left ventricular systolic function is normal, LVEF is visually  estimated at > 55%.  3. There is mild mitral valve regurgitation.  4. The right ventricle is normal in size, with normal systolic function.      Left Ventricle  The left ventricle is normal in size with normal wall thickness.  The left ventricular systolic function is normal, LVEF is visually estimated  at > 55%.  There is normal left ventricular diastolic function.    Right Ventricle  The right ventricle is normal in size, with normal systolic function.      Left Atrium  The left atrium is normal in size.    Right Atrium  The right atrium is normal  in size.      Aortic Valve  The aortic valve is trileaflet with normal appearing leaflets with normal  excursion.  There is no significant aortic regurgitation.  There is no evidence of a significant transvalvular gradient.    Pulmonic Valve  The pulmonic valve is normal.  There is no significant pulmonic regurgitation.  There is no evidence of a significant transvalvular gradient.    Mitral Valve  The mitral valve leaflets are normal with normal leaflet mobility.  There is mild mitral valve regurgitation.    Tricuspid Valve  The tricuspid valve leaflets are normal, with normal leaflet mobility.  There is no significant tricuspid regurgitation.  The pulmonary systolic pressure cannot be estimated due to insufficient TR  jet.      Pericardium/Pleural  There is no pericardial effusion.    Inferior Vena Cava  IVC size and inspiratory change suggest normal right atrial pressure. (0-5  mmHg).    Aorta  The aorta is normal in size in the visualized segments.      Pulmonic Valve  ----------------------------------------------------------------------  Name                                 Value        Normal  ----------------------------------------------------------------------    PV Doppler  ----------------------------------------------------------------------  PV Peak Velocity                   1.1 m/s    Mitral Valve  ----------------------------------------------------------------------  Name                                 Value        Normal  ----------------------------------------------------------------------    MV Diastolic Function  ----------------------------------------------------------------------  MV E Peak Velocity                 81 cm/s  MV A Peak Velocity                102 cm/s  MV E/A 0.8    MV Annular TDI  ----------------------------------------------------------------------  MV Septal e' Velocity             6.4 cm/s         >=8.0  MV Lateral e' Velocity            9.0 cm/s        >=10.0  MV e' Average                          7.7  MV E/e' (Average)  10.8    Tricuspid Valve  ----------------------------------------------------------------------  Name                                 Value        Normal  ----------------------------------------------------------------------    Estimated PAP/RSVP  ----------------------------------------------------------------------  RA Pressure                         3 mmHg           <=5    Aorta  ----------------------------------------------------------------------  Name                                 Value        Normal  ----------------------------------------------------------------------    Ascending Aorta  ----------------------------------------------------------------------  Ao Root Diameter (2D)               2.7 cm  Ao Root Diam Index (2D)         14.1 cm/m2    Venous  ----------------------------------------------------------------------  Name                                 Value        Normal  ----------------------------------------------------------------------    IVC/SVC  ----------------------------------------------------------------------  IVC Diameter (Exp 2D)               1.4 cm         <=2.1    Aortic Valve  ----------------------------------------------------------------------  Name                                 Value        Normal  ----------------------------------------------------------------------    AV Doppler  ----------------------------------------------------------------------  AV Peak Velocity                   1.4 m/s    Ventricles  ----------------------------------------------------------------------  Name                                 Value Normal  ----------------------------------------------------------------------    LV Dimensions 2D/MM  ----------------------------------------------------------------------  IVS Diastolic Thickness (2D)        0.8 cm       0.6-0.9  LVID Diastole (2D)                  4.6 cm       3.8-5.2  LVPW Diastolic Thickness  (2D)                                0.8 cm       0.6-0.9  LVID Systole (2D)                   3.0 cm       2.2-3.5    RV Dimensions 2D/MM  ----------------------------------------------------------------------  RV Basal Diastolic Dimension        3.0 cm       2.5-4.1  TAPSE  1.9 cm         >=1.7    Atria  ----------------------------------------------------------------------  Name                                 Value        Normal  ----------------------------------------------------------------------    LA Dimensions  ----------------------------------------------------------------------  LA Dimension (2D)                   3.4 cm       2.7-3.8  LA Volume (BP MOD)                   32 ml  LA Volume Index (BP MOD)       16.74 ml/m2   16.00-34.00    RA Dimensions  ----------------------------------------------------------------------  RA Area (4C)                      11.3 cm2        <=18.0  RA Area (4C) Index              5.9 cm2/m2      Report Signatures  Finalized by Yaakov Guthrie  MD on 02/10/2021 02:52 PM  , and EKG   Encounter Date: 03/07/23   ECG 12 Lead   Result Value    EKG Systolic BP     EKG Diastolic BP     EKG Ventricular Rate 61    EKG Atrial Rate 61    EKG P-R Interval 156    EKG QRS Duration 78    EKG Q-T Interval 406    EKG QTC Calculation 408    EKG Calculated P Axis 74    EKG Calculated R Axis -13    EKG Calculated T Axis 8    QTC Fredericia 408    Narrative    NORMAL SINUS RHYTHM  LOW VOLTAGE QRS  CANNOT RULE OUT ANTERIOR INFARCT  , AGE UNDETERMINED  ABNORMAL ECG  WHEN COMPARED WITH ECG OF 08-Mar-2023 06:45,  SINUS RHYTHM HAS REPLACED JUNCTIONAL RHYTHM  NONSPECIFIC T WAVE ABNORMALITY NOW EVIDENT IN ANTERIOR LEADS         Pending Test Results:   Pending Labs       Order Current Status    Blood Culture #1 Preliminary result    Blood Culture #2 Preliminary result              Hospital Course:   ACTIVE PROBLEMS:  # 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  Shortly thereafter had worsening altered mental status and therefore repeat CTA head was performed with no LVO.  Acute worsening likely due to seizures as described below.  Etiology for her recurrent multifocal strokes is most likely due to hypercoagulable state from her metastatic pancreatic cancer.  She also has tobacco use, diabetes, and hypertension which could be playing a role as well. While anticoagulation would be the treatment for malignancy, we will defer anticoagulation in the setting of further hematological workup.  Will continue Plavix when medically able as patient potentially may have had an allergy to aspirin.     - Non-contrasted CT scan of the head showed no acute abnormality  - A1c 7.5, LDL 69  - echo 10/2 normal. Repeat POCUS here showed trace pericardial effusion  - carotid  doppler 02/10/23 showed left IC stenosis <50%  - tele shows sinus bradycardia     Plan:  - Consider transitioning patient to plavix when able due to potential allergy  - resume home rosuvastatin 10mg   - Will need smoking cessation counseling [x]   - CONT. Clopidogrel 75 mg  - PT/OT consulted     #AMS:   #Concern for provoked seizures  Patient was waiting in the emergency department prior to discharge, when she began to have altered mental status.  Last known normal 4 PM, GCS 15 prior to this, but on reevaluation at 8 PM GCS was 7, patient was nonverbal, not opening eyes, moving right side briskly to noxious and left side weakly to noxious.  She was given 4 mg Ativan and 4.5 mg Keppra load. Per ED, sonorous respiration, having repetitive motion of her right upper extremity, lip smacking, and obtunded. She had multiple erratic glucoses while here, initially normal, then low at 18, immediate recheck 357.  Etiology of altered mental status most likely from acute symptomatic seizure from hypoglycemia.  However given the location of the semiology and contrast-enhancing lesion in left hemisphere, seizure could also potentially be from a metastatic lesion.  We would favor continuing antiseizure medicines but currently on hold from concerns for sedation. Patient was removed from antiepileptics given that her seizures were likely provoked by hypoglycemia.   - s/p keppra 4.5g and ativan 4mg      #FEN  - Regular diet  - CONTINUE calorie count  - CONSULTED nutrition, appreciate recs  + Nepro BID  + Pt counseled on limiting sweet tea, sugary drinks  - CONTINUE POCT achs  - Due to mental status, patient declining food  - Will monitor  - Receiving maintenance fluids     # Diabetes -  # Hypoglycemia  Patient had erratic glucoses and at one time received 1 amp of dextrose while in ED and hypoglycemia possibly responsible for AMS/seizures. Unclear etiology as she did not receive any antihyperglycemics in ED, possibly related to pancreatic cancer.  Endocrinology felt this was iatrogenic as she received insulin prior to coming to ED. Negative cosyntropin stim test. Final recommendations listed below for discharge:  - Semglee 25 units daily (glargine)  - Novolog 8 units TID AC  - Sliding scale ISF 50, target 140  Please include following in patient's discharge paperwork:  - Long-acting: Semglee 25 units once daily  - Short-acting: Novolog 8 units three times daily before meals, remember to give 10-15 minutes before your meal  - Correction factor: In addition to the mealtime regimen above, add 1 unit of Novolog insulin for pre-meal blood sugars every 50 above 150, using the scale below:  - 70-150: no additional units  - 151-200: 1 unit  - 201-250: 2 units  - 251-300: 3 units  - 301-350: 4 units  - 350+: 5 units  - Diabetes educator on Monday, 10/28 (if patient still inpatient)     #Metastatic pancreatic cancer with liver mets  First discovered 01/2023 with presentation for GI symptoms. Biopsy confirmed last week. It is unclear if there is brain involvement at this time. She has followup with hematology scheduled 03/15/2023. Palliative care to FU outpatient. Oncology to follow-up outpatient. Patient with itching while using Oxycodone, provided Benadryl.  - CONTINUE Oxycodone 5 mg q6h. Upon discharge, Oxycodone 5 mg q4h will be continued. 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 Not on dialysis at this time. on home bicarb daily  - CTM     #Transaminitis: likely iso metastatic liver disease  - ctm     # C/f Urinary Retention - AMS  Pt's bladder scans have been less than 50 ml since arrival to floor.  - DISCONTINUE IO caths  - No need for Foley at this time     STABLE PROBLEMS:     # HTN:  Pt with hypotension on arrival to floor likely 2/2 poor PO intake. Received 1 L of maintenance fluids.  - HELD home lisinopril      # Pain:  - holding home lyrica     # HLD:  - home rosuvastatin        RESOLVED PROBLEMS:  # Intubation  - Patient required intubation for low GCS. Now extubated.     Condition at Discharge: fair      Body mass index is 22.97 kg/m??.    Patient does not meet AND/ASPEN criteria for malnutrition at this time (03/08/23 1350)       Code Status:  Full Code     Discharge Exam:     General Exam:  General Appearance: Chronically ill appearing.  HEENT: Head is atraumatic and normocephalic. Sclera anicteric without injection. Oropharyngeal membranes are moist with no erythema or exudate. EEG leads on.   Neck: Supple.  Heart: WWP     Neurological Exam:  Mental Status: Awakens by tactile stimulation. Able to follow simple commands and and can speak in sentences with significant prompting. No dysarthria  Cranial Nerves: PERRL 4 mm. Face grossly symmetrical.   Motor Exam: Moving all extremities spontaneously and to command.    Reflexes: Toes downgoing.    Sensory: Reacts to noxious on LUE, BLE. RUE deferred due to agitation  Cerebellar/Coordination/Gait: Deferred due to mental status    Admission NIHSS: 16  Discharge NIHSS: 0  Admission GCS: 7  Discharge GCS: 15  Modified Rankin Scale on discharge: 2 = Slight disability; independent for ADLs and able to look after own affairs without assistance, but unable to carry out all previous activities    Patient discharged on antithrombotic agent? yes  Patient discharged on anticoagulation? no; reason for no anticoagulation:  patient does not have atrial fibrillation or any other indication for anticoagulation  Patient discharged on statin? yes    Discharge Medications:     Your Medication List        STOP taking these medications      aspirin 81 MG tablet  Commonly known as: ECOTRIN     AURYXIA 210 mg iron Tab tablet  Generic drug: ferric citrate     cholecalciferol (vitamin D3-25 mcg (1,000 unit)) 25 mcg (1,000 unit) capsule     clobetasol 0.05 % external solution  Commonly known as: TEMOVATE     clobetasol 0.05 % ointment  Commonly known as: TEMOVATE     cyclobenzaprine 10 MG tablet  Commonly known as: FLEXERIL     JARDIANCE 10 mg tablet  Generic drug: empagliflozin     NovoLOG Flexpen U-100 Insulin 100 unit/mL (3 mL) injection pen  Generic drug: insulin aspart     omeprazole 20 MG capsule  Commonly known as: PriLOSEC     pregabalin 50 MG capsule  Commonly  known as: LYRICA     rosuvastatin 10 MG tablet  Commonly known as: CRESTOR            START taking these medications atorvastatin 40 MG tablet  Commonly known as: LIPITOR  Take 1 tablet (40 mg total) by mouth daily.     blood-glucose meter kit  Use as instructed     clopidogrel 75 mg tablet  Commonly known as: PLAVIX  Take 1 tablet (75 mg total) by mouth daily.     insulin lispro 100 unit/mL injection  Commonly known as: HumaLOG  Inject 0.08 mL (8 Units total) under the skin Three (3) times a day before meals.     insulin lispro 100 unit/mL injection  Commonly known as: HumaLOG  Inject 0-0.06 mL (0-6 Units total) under the skin four (4) times a day AND 0.08 mL (8 Units total) Three (3) times a day before meals. Add 1 unit of Novolog insulin for pre-meal blood sugars every 50 above 150, using the scale 151-200: 1 unit, 201-250: 2 units, 251-300: 3 units, 301-350: 4 units, 350+: 5 units.     insulin syringe-needle U-100 1 mL 31 gauge x 5/16 (8 mm) Syrg  Commonly known as: TRUEPLUS INSULIN  Use with insulin up to 4 times/day as needed.     lancets Misc  Use to check blood sugar as directed with insulin 3 times a day & for symptoms of high or low blood sugar.     nicotine polacrilex 4 MG lozenge  Commonly known as: NICORETTE  Apply 1 lozenge (4 mg total) to cheek every hour as needed for smoking cessation.            CHANGE how you take these medications      nicotine 7 mg/24 hr patch  Commonly known as: NICODERM CQ  Place 1 patch on the skin daily for 28 days.  What changed:   Another medication with the same name was changed. Make sure you understand how and when to take each.  Another medication with the same name was removed. Continue taking this medication, and follow the directions you see here.     nicotine 7 mg/24 hr patch  Commonly known as: NICODERM CQ  Place 1 patch on the skin daily.  Start taking on: March 15, 2023  What changed:   when to take this  additional instructions  These instructions start on March 15, 2023. If you are unsure what to do until then, ask your doctor or other care provider.  Another medication with the same name was removed. Continue taking this medication, and follow the directions you see here.     pen needle, diabetic 32 gauge x 5/32 (4 mm) Ndle  Inject as directed Three (3) times a day before meals.  What changed:   Another medication with the same name was added. Make sure you understand how and when to take each.  Another medication with the same name was changed. Make sure you understand how and when to take each.     pen needle, diabetic 32 gauge x 5/32 (4 mm) Ndle  Use with insulin up to 4 times/day as needed.  What changed: additional instructions     pen needle, diabetic 32 gauge x 5/32 (4 mm) Ndle  Use with insulin up to 4 times/day as needed.  What changed: You were already taking a medication with the same name, and this prescription was added. Make sure you understand how and when to take  each.     SEMGLEE(INSULIN GLARG-YFGN)PEN 100 unit/mL (3 mL) Inpn  Generic drug: insulin glargine-yfgn  Inject 25 Units under the skin daily.  What changed: See the new instructions.            CONTINUE taking these medications      lisinopril 10 MG tablet  Commonly known as: PRINIVIL,ZESTRIL  Take 1 tablet (10 mg total) by mouth daily.     oxyCODONE 5 MG immediate release tablet  Commonly known as: ROXICODONE  Take 1 tablet (5 mg total) by mouth every four (4) hours as needed for pain for up to 21 days.     sodium bicarbonate 650 mg tablet  Take 2 tablets (1,300 mg total) by mouth Two (2) times a day.     STELARA 45 mg/0.5 mL Syrg syringe  Generic drug: ustekinumab  Inject the contents of 1 syringe (45 mg) under the skin once every 12 weeks.     TRUE METRIX GLUCOSE TEST STRIP Strp  Generic drug: blood sugar diagnostic  True Metrix Glucose Test Strip              Discharge Instructions:     Other Instructions       Discharge instructions      You were evaluated for mild cognitive changes. Neurology was consulted with the following recommendations:  1. Continue Plavix  2. Continue home rosuvastatin  3. Start your new diabetes regimen as follows:    - Long-acting: Semglee 25 units once daily  - Short-acting: Novolog 8 units three times daily before meals, remember to give 10-15 minutes before your meal  - Correction factor: In addition to the mealtime regimen above, add 1 unit of Novolog insulin for pre-meal blood sugars every 50 above 150, using the scale below:    70-150: no additional units  151-200: 1 unit  201-250: 2 units  251-300: 3 units  301-350: 4 units  350+: 5 units    4. Follow up with [facility] Neurology (Dr Atha Starks) on 03/10/2023   a. Will need repeat MRI brain in 6-8 weeks for enhancement in left postcentral gyrus  5. Follow-up outpatient with [facility], Dr. Rozell Searing, as scheduled on 03/15/2023.  6. Follow-up outpatient with [facility] Palliative care. They will contact the patient to schedule. If you do not hear from them, call [phone] and ask to be sent to the Palliative Care clinic at [facility].  7. Follow-up outpatient with [facility] Nephrology, Dr. Cherly Hensen, as scheduled on 03/28/2023.     You were diagnosed with an ischemic stroke (commonly just called stroke.) This happens when one or more blood vessels in the brain have too little blood flow, causing parts of the brain to be injured. The injury to the brain causes symptoms such as numbness, weakness, or difficulty speaking.     We will need to work with you on your risk factor(s) for getting a stroke, to lower your risk of having a stroke again in the future. These risk factors include:  hypertension, hyperlipidemia, older age (age>65), smoking, and hypercoagulable state    You are at higher risk for having a stroke compared to the regular population.  Take your medicine as directed. Take antiplatelet medication (Aspirin or Clopidogrel) every day as instructed unless directed otherwise (for example, temporarily before surgery). You were also prescribed a statin medication for your cholesterol to help reduce your risk for stroke.  Call your primary healthcare provider if you think your medicine is not working as expected or  if you have any medication side effects. Take a list of your medicines or the pill bottles to follow-up visits. Carry your medicine list with you in case of an emergency. Please refer to the care notes given to you by Nursing.      To reduce your risk of a stroke:  1.  Treat high blood pressure and take medication as prescribed.  2.  Stop smoking and avoid secondhand smoke.  3.  Eat a heart healthy diet that is low in salt and cholesterol. Consider discussing with your primary care physician regarding starting a Mediterranean diet.  4.  Become physically active and keep your weight under control.  5.  Control your blood sugar and take you medications as instructed if you have diabetes.  6.  If prescribed, take your cholesterol medication.  7.  Do not drink excessive alcohol and no more than 2-3 drinks/day as a man or 1-2 drinks/day as a woman.    Diet: Heart Healthy Diet  Activity: Regular exercise recommended    Please call 911 and present to the nearest emergency department immediately if you have experience any of the following signs or symptoms:   1.   Suddenly feel numb or weak in the face, arm or leg, especially on one side of the body.  2.   Suddenly have trouble seeing with one eye or both of them.  3.   Suddenly have a hard time talking or understanding what someone is saying.  4.   Suddenly feel dizzy or lose balance.  5.   Have a sudden, very bad headache with no known cause.    Follow Up Appointments:    Return To: your primary care provider within:  2 weeks    Return To: [facility] for next available appointment. You will be contacted to schedule your hospital follow-up appointment within 1-2 weeks after discharge, but if for some reason you do not receive a call, please call the [facility] to schedule an appointment at [phone].    For all other post-discharge medical questions regarding your stroke, please call the Neurology Clinic at [phone] and ask to speak with Dr. Atha Starks, who was the senior resident taking care of you during this hospitalization.    For questions regarding outpatient physical therapy/occupational therapy/speech therapy appointments, please call:  [facility] - [phone]    Support Groups include:  [facility] Stroke Support Group  Meets on the 2nd Wednesday of the Month from 1-2pm at the [facility], [address] [facility] [address] [facility]  Contact: Holland Falling, Ph.D, [facility]   [email or web address]   [phone]    [facility] Stroke Support Group (sponsored by [employer])  Contact Carroll Kinds at [phone] for more information    American Stroke Association  1-888-4-STROKE   www.strokeassociation.com        Follow Up instructions and Outpatient Referrals     Ambulatory Referral to Palliative Care      Reason for referral: cancer diagnosis, CKD5, recent hospitalization for   AMS    Is this a palliative referral for:  symptom management  goals of care  coping support       Requested follow up plan: You would evaluate and manage.    [place] [facility] (Non-Cancer Diagnosis)    [place] [facility] [place] (Cancer Diagnosis)    [place] [facility] [place] (Cancer Diagnosis - [place])  [place] [facility] (Home-Based)    [facility] [place] (Pediatrics)    *For [facility] Palliative Care Programs, contact the organization directly   for external referral process    Ambulatory Referral to Neurology      Reason for referral: history of recent stroke and induced seizures    Requested follow up plan: You would evaluate and manage.    Discharge instructions        Appointments which have been scheduled for you      Mar 14, 2023 9:30 AM  (Arrive by 8:30 AM)  IR INSERT PORT > 5 YEARS with NELS IR RM 2  IMG IR NELS [address] [facility] [address]) [address]  [place] [address]  [phone]   On appiontment date:  Come with adult to accompany patient home  Bring recent lab work  Bring any meds you take  Take meds with small sip of water  Check with physician about current meds  Check with physician if diabetic  Check with physician if patient takes blood thinners  Arrive 1 hour early    On appointment date do not:  Consume solids after midnight  Consume anything 2 hours prior to procedure    Let us know if patient:  Pregnant  Allergic to iodine or contrast dyes  Prior arterial or vascular graft operations  History of unstable angina         Mar 15, 2023 10:15 AM  (Arrive by 10:00 AM)  NURSE VISIT with [facility]  [facility] REGION) [address]  1st Floor  [place] [address]  [phone]        Mar 15, 2023 10:30 AM  (Arrive by 10:15 AM)  RETURN ACTIVE [place] with Rod Mae, FNP  [facility] REGION) [address]  1st Floor  [place] [address]  [phone]        Mar 16, 2023 7:30 AM  (Arrive by 7:15 AM)  INFUSION ONLY with ONCDEV HMOB CHAIR 1  [facility] REGION) [address]  1st Floor  [place] [address]  [phone]        Mar 16, 2023 2:10 PM  (Arrive by 1:40 PM)  MRI BRAIN W WO CONTRAST  ADS WR with WR S ESTES 110 MRI RM 1  IMG MRI WAKE RAD S ESTES 110 ([facility] [address]) [address]  [place] [address]  [phone]   In an effort to ensure a smooth experience, please do not wear jewelry or clothing with metal to your appointment. Please use the link below to log into My [place] Chart to complete necessary forms prior to your arrival. Call [phone] with questions.    [email or web address]       Mar 16, 2023 2:45 PM  (Arrive by 2:15 PM)  MRI PELVIS W WO CONTRAST  ADS WR with WR S ESTES 110 MRI RM 1  IMG MRI WAKE RAD S ESTES 110 ([facility] [address]  [place] [address]  [phone]   Please do not eat or drink anything after midnight or for 6 hours prior to appointment.  In an effort to ensure a smooth experience, please do not wear jewelry or clothing with metal to your appointment. Please use the link below to log  into My [place] Chart to complete necessary forms prior to your arrival. Call [phone] with questions.    [email or web address]       Mar 28, 2023 3:30 PM  (Arrive by 3:15 PM)  RETURN  GENERAL with Mila Merry, MD  [place] [facility]) [address]  [place] [address]  [phone]        Jun 20, 2023 3:15 PM  (Arrive by 3:00 PM)  RETURN  GENERAL with Abbie Sons, MD  [facility] DERMATOLOGY AND SKIN CANCER CENTER [facility] REGION) [address] [place] [address]  [place] [address]  [phone]                    I spent  in the discharge of this patient.    Jen Mow, MD  [facility] Neurology, PGY-1

## 2023-03-12 NOTE — Unmapped (Signed)
Problem: Adult Inpatient Plan of Care  Goal: Plan of Care Review  Outcome: Resolved  Note: Patient alert and oriented x4. Up ad lib in room. Patient complained of indigestion and stomach ache, Tylenol, Oxy, and Tums administered, tolerated well. Discharge pending home w/ daughter. Aspiration and falls precautions maintained. VSS.   Goal: Patient-Specific Goal (Individualized)  Outcome: Resolved  Goal: Absence of Hospital-Acquired Illness or Injury  Outcome: Resolved  Intervention: Identify and Manage Fall Risk  Recent Flowsheet Documentation  Taken 03/12/2023 0800 by Veverly Fells, Raphael Espe I, RN  Safety Interventions:   fall reduction program maintained   lighting adjusted for tasks/safety   low bed   nonskid shoes/slippers when out of bed  Intervention: Prevent Skin Injury  Recent Flowsheet Documentation  Taken 03/12/2023 0800 by Veverly Fells, Jovany Disano I, RN  Positioning for Skin: Supine/Back  Device Skin Pressure Protection:   absorbent pad utilized/changed   adhesive use limited   positioning supports utilized   pressure points protected  Skin Protection:   adhesive use limited   incontinence pads utilized   hydrocolloids used  Intervention: Prevent and Manage VTE (Venous Thromboembolism) Risk  Recent Flowsheet Documentation  Taken 03/12/2023 1400 by Veverly Fells, Elisandro Jarrett I, RN  Anti-Embolism Device Type: SCD, Knee  Anti-Embolism Intervention: Refused  Anti-Embolism Device Location: BLE  Taken 03/12/2023 1200 by Veverly Fells, Zackarie Chason I, RN  Anti-Embolism Device Type: SCD, Knee  Anti-Embolism Intervention: Refused  Anti-Embolism Device Location: BLE  Taken 03/12/2023 1000 by Veverly Fells, Esthefany Herrig I, RN  Anti-Embolism Device Type: SCD, Knee  Anti-Embolism Intervention: Refused  Anti-Embolism Device Location: BLE  Taken 03/12/2023 0800 by Veverly Fells, Mykhia Danish I, RN  Anti-Embolism Device Type: SCD, Knee  Anti-Embolism Intervention: Refused  Anti-Embolism Device Location: BLE  Goal: Optimal Comfort and Wellbeing  Outcome: Resolved  Goal: Readiness for Transition of Care  Outcome: Resolved  Goal: Rounds/Family Conference  Outcome: Resolved     Problem: Skin Injury Risk Increased  Goal: Skin Health and Integrity  Outcome: Resolved  Intervention: Optimize Skin Protection  Recent Flowsheet Documentation  Taken 03/12/2023 0800 by Veverly Fells, Leigh Kaeding I, RN  Pressure Reduction Techniques:   frequent weight shift encouraged   heels elevated off bed   weight shift assistance provided  Head of Bed (HOB) Positioning: HOB at 20-30 degrees  Pressure Reduction Devices: pressure-redistributing mattress utilized  Skin Protection:   adhesive use limited   incontinence pads utilized   hydrocolloids used     Problem: Self-Care Deficit  Goal: Improved Ability to Complete Activities of Daily Living  Outcome: Resolved     Problem: Fall Injury Risk  Goal: Absence of Fall and Fall-Related Injury  Outcome: Resolved  Intervention: Promote Scientist, clinical (histocompatibility and immunogenetics) Documentation  Taken 03/12/2023 0800 by Veverly Fells, Haddie Bruhl I, RN  Safety Interventions:   fall reduction program maintained   lighting adjusted for tasks/safety   low bed   nonskid shoes/slippers when out of bed     Problem: Mechanical Ventilation Invasive  Goal: Absence of Device-Related Skin and Tissue Injury  Outcome: Resolved  Intervention: Maintain Skin and Tissue Health  Recent Flowsheet Documentation  Taken 03/12/2023 0800 by Veverly Fells, Bert Ptacek I, RN  Device Skin Pressure Protection:   absorbent pad utilized/changed   adhesive use limited   positioning supports utilized   pressure points protected

## 2023-03-12 NOTE — Unmapped (Addendum)
V/S: soft BPs, within parameters    increased blood sugar, NMA aware, endocrine following, insulin given per sliding scale, patient educated on sugar intake    Neuro: Ox3, follows commands  Musculoskeletal: ambulates contact guard, impulsive, bed alarm activated  Fall and safety precautions maintained, daughter at bedside. No further needs at this time.     Problem: Adult Inpatient Plan of Care  Goal: Plan of Care Review  Outcome: Progressing  Goal: Patient-Specific Goal (Individualized)  Outcome: Progressing  Flowsheets (Taken 03/11/2023 1833)  Patient/Family-Specific Goals (Include Timeframe): patient will eat 50% of meals by 10/27  Goal: Absence of Hospital-Acquired Illness or Injury  Outcome: Progressing  Goal: Optimal Comfort and Wellbeing  Outcome: Progressing  Goal: Readiness for Transition of Care  Outcome: Progressing  Goal: Rounds/Family Conference  Outcome: Progressing     Problem: Skin Injury Risk Increased  Goal: Skin Health and Integrity  Outcome: Progressing     Problem: Self-Care Deficit  Goal: Improved Ability to Complete Activities of Daily Living  Outcome: Progressing     Problem: Fall Injury Risk  Goal: Absence of Fall and Fall-Related Injury  Outcome: Progressing     Problem: Mechanical Ventilation Invasive  Goal: Absence of Device-Related Skin and Tissue Injury  Outcome: Progressing

## 2023-03-12 NOTE — Unmapped (Signed)
VS: Stable this shift. BP soft, within parameters.    Neuro: Alert, oriented x4, follows commands. RUE 4/5, LUE 4/5, RLE 4/5, LLE 4/5, full sensation. No neuro changes.    Precautions in place: falls and aspiration.    No falls or signs of new skin breakdown this shift.  No complaints of pain. Oral care provided by patient. Pt ambulatory independently. Voiding spontaneously, (-)BM this shift. Pt in bed, low and locked position, side rails up, bed alarm on, call bell within reach. No outstanding needs expressed at this time.    Problem: Adult Inpatient Plan of Care  Goal: Plan of Care Review  Outcome: Progressing  Goal: Patient-Specific Goal (Individualized)  Outcome: Progressing  Flowsheets (Taken 03/12/2023 0236)  Patient/Family-Specific Goals (Include Timeframe): Pt will sleep at least 6 hours each night by 10/29  Goal: Absence of Hospital-Acquired Illness or Injury  Outcome: Progressing  Intervention: Identify and Manage Fall Risk  Recent Flowsheet Documentation  Taken 03/11/2023 2000 by Marliss Coots, RN  Safety Interventions:   aspiration precautions   environmental modification   fall reduction program maintained   lighting adjusted for tasks/safety   low bed   nonskid shoes/slippers when out of bed   room near unit station  Intervention: Prevent and Manage VTE (Venous Thromboembolism) Risk  Recent Flowsheet Documentation  Taken 03/12/2023 0200 by Marliss Coots, RN  Anti-Embolism Intervention: Refused  Taken 03/12/2023 0000 by Marliss Coots, RN  Anti-Embolism Intervention: Refused  Taken 03/11/2023 2200 by Marliss Coots, RN  Anti-Embolism Intervention: Refused  Taken 03/11/2023 2000 by Marliss Coots, RN  Anti-Embolism Intervention: Refused  Goal: Optimal Comfort and Wellbeing  Outcome: Progressing  Goal: Readiness for Transition of Care  Outcome: Progressing  Goal: Rounds/Family Conference  Outcome: Progressing     Problem: Skin Injury Risk Increased  Goal: Skin Health and Integrity  Outcome: Progressing  Intervention: Optimize Skin Protection  Recent Flowsheet Documentation  Taken 03/12/2023 0200 by Marliss Coots, RN  Head of Bed [facility]) Positioning: HOB at 30 degrees  Taken 03/12/2023 0000 by Marliss Coots, RN  Head of Bed [facility]) Positioning: HOB at 30 degrees  Taken 03/11/2023 2200 by Marliss Coots, RN  Head of Bed [facility]) Positioning: HOB at 30 degrees  Taken 03/11/2023 2000 by Marliss Coots, RN  Head of Bed [facility]) Positioning: HOB at 30 degrees     Problem: Self-Care Deficit  Goal: Improved Ability to Complete Activities of Daily Living  Outcome: Progressing     Problem: Fall Injury Risk  Goal: Absence of Fall and Fall-Related Injury  Outcome: Progressing  Intervention: Promote Injury-Free Environment  Recent Flowsheet Documentation  Taken 03/11/2023 2000 by Marliss Coots, RN  Safety Interventions:   aspiration precautions   environmental modification   fall reduction program maintained   lighting adjusted for tasks/safety   low bed   nonskid shoes/slippers when out of bed   room near unit station     Problem: Mechanical Ventilation Invasive  Goal: Absence of Device-Related Skin and Tissue Injury  Outcome: Progressing

## 2023-03-13 NOTE — Unmapped (Signed)
Hi,     Amy [name] contacted the Communication Center regarding the following:    - States that she was speaking with someone and the phone disconnected she was calling back, asking for a return call     Please contact Amy [name] at [phone].    Thanks in advance,    Iona Hansen  [facility] Cancer Communication Center   [phone]

## 2023-03-13 NOTE — Unmapped (Signed)
Hi,     Megan w/[facility] contacted the Communication Center requesting to speak with the care team of Amy [name] to discuss:    Asking to speak with someone from the patients care team     Please contact Megan  at [phone]    Thank you,   Iona Hansen  [facility] Cancer Communication Center   [phone]

## 2023-03-13 NOTE — Unmapped (Signed)
Returned all to [employer]. States she is with pt's transitional care team.Let her know pt will be seen on 03-15-23 by her provider and the plan going forward will be discussed then.She will call back with any further needs.

## 2023-03-13 NOTE — Unmapped (Signed)
Please schedule patient for follow as follows:    Resident Clinic  Template: HOSPITAL FOLLOW UP [place]   Time: 1 hour slot, or two consecutive 30 minute slots   Provider: Leonidas Romberg, Courtney Heys  Diagnosis: Stroke, (history of seizure from hypoglycemia, not from stroke)  Time range: URGENT: between 2-3 months  Preferred day of week: Any  Please ensure scheduled PRIOR to appointment: n/a    If no appointments are available by the above date, please schedule patient for next available NEUGEN TRIAGE clinic.    If no appointments are available by the above date for Resident Clinic or NEUGEN TRIAGE, please schedule patient in:  Stroke Post-Discharge Clinic with Dr. Jaymes Graff for first available Tuesday for 1-hour NEW patient appointment (1:00, 2:00, 3:00, or 4:00)

## 2023-03-13 NOTE — Unmapped (Signed)
Attempted to return call. Left V/M for Megan to return my call.

## 2023-03-14 DIAGNOSIS — C259 Malignant neoplasm of pancreas, unspecified: Principal | ICD-10-CM

## 2023-03-14 NOTE — Unmapped (Unsigned)
[facility] MEDICAL ONCOLOGY CANCER CLINIC NOTE    Encounter Date: 03/15/2023    Referring Physician:   Oneita Hurt, None Per Patient  [address]  [place],  [place] [address]    PCP:   [name], MD    Consulting Providers:  Dr. Susa Griffins, MD [facility] Medical GI Oncology)  ____________________________________________________________________  Oncology History:   Diagnosis: 2.9 cm pancreatic mass w/ numerous liver mets   Current Goal of Therapy: pending pathology   Molecular: MSI-s  ____________________________________________________________________  Assessment and Plan:  Amy [name] is a 62 y.o. 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 These strokes have left her with some mild disability such as weakness, fatigue, forgetfullness, and vision changes. She feels she has appropriate help with her daughter. We discussed today extensively that if we started treated today to treat her metastatic pancreatic cancer, she would have roughly 3 - 6 months to live and without treatment I estimate weeks, but we cannot guarantee she doesn't suffer from further strokes and neurological deficits. These unfortunate diagnosis on top of CKD5, her prognosis overall is very poor. We discussed we could either proceed with aggressive cancer directed treatment or transition to best supportive care with home hospice referral, which would be very much appropriate. After informed discussion with her and her daughter Amy [name], they have elected to transition to home hospice. We then delved into code status. After benefit vs risk of CPR and Intubation, she has desired to change her code status to DNR / DNI (order placed). Appropriate documentation including MOST form was filled out in clinic, scanned into EPIC, and hard copy was returned to patient to provide to hospice agency.     Health Care Decision Maker as of 03/15/2023    HCDM (patient stated preference) (Active): Amy [name] - Daughter - [phone]     Cancer Related Pain:  - Tylenol 1 g Q6H  - Oxycodone 5 mg q6h (no refill provided given hospice is on board)    Constipation:   - Bowel regimen 2 senna BID / MiraLAX 1-2 capfuls daily   - PRN Mag Citrate    Supportive Care:  Hyperkalemia: In the setting of CKD.  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 Having scattered bruising.   - Risk vs. Benefit of ongoing anticoagulation should be weighed In leu of hospice transition   - on aspirin and plavix    Tobacco Use:  - Nicotine patches, declined gum / lozenges  - s/p Referral placed for tobacco cessation program     Follow-Up:  No follow up warranted as she's transitioning to home hospice.    Medical decision making based high complexity of cancer and treatment risk of complications and monitoring toxicity of chemotherapy. Labs, imaging, and disposition reviewed. Cancer diagnosis, treatment, and GOC performed. Hospice agency referral placed. Discussed plan with Dr. Rozell Searing who is in agreement.      This note was created with dictation software. Please excuse any transcription errors. _____________________________________________________________________  History:  Haja Hesketh is a 62 y.o. female who presented as a new patient in consultation at the request of Pcp, None Per Patient regarding 2.9 cm pancreatic mass w/ numerous liver mets w/ largest being 3.2 cm w/ intracranial metastatic disease, most prominently in the right occipital lobe:     Amy [name] presents for GOC discussion in leu of recent hospital admission for embolic infarcts in multiple territories. Today, she reports no recurrent seizure activity but does have some fatigue, intermittent confusion, and ongoing vision impairment. Denies nausea or vomiting, but voices she's having abdominal pain secondary to constipation in leu of oxycodone use. Last BM a couple days ago. Took MiraLAX x 1. Agreeable to try increased bowel regimen. Denies nausea or vomiting. Denies fever, chills, SOB, chest pain, or edema.     Past Medical History:   Alport syndrome    Back pain    Chronic kidney disease    Dental abscess    Diabetes mellitus (CMS-HCC)     type 2, don't remember A1C, controlled with insulin injections; okay per physician at last visit    Hyperlipidemia    Hypertension     well controlled with meds    Kidney stone     no problems since 1995    Psoriasis           Family History:  Maternal Grandmother - Non-Hodgkin Lymphoma   Maternal Grandfather - Leukemia   1 grown daughter - no health issues     Problem Relationship    Kidney disease Mother    Diabetes Mother    Cataracts Mother    Heart attack Father    Cancer Paternal Grandmother    Breast cancer Neg Hx    Colon cancer Neg Hx    Endometrial cancer Neg Hx    Ovarian cancer Neg Hx    Melanoma Neg Hx    Basal cell carcinoma Neg Hx    Squamous cell carcinoma Neg Hx     Social History:  Tobacco Use    Smoking status: Former       Current packs/day: 0.00       Average packs/day: 1 pack/day for 43.0 years (43.0 ttl pk-yrs)       Types: Cigarettes       Start date: 02/12/1978       Quit date: 02/12/2021       Years since quitting: 1.9    Smokeless tobacco: Never   Substance Use Topics    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    Physical Exam:  VITALS: BP 121/64  - Pulse 70  - Temp 36.3 ??C (97.4 ??F) (Temporal)  - Resp 16  - Wt 66.3 kg (146 lb 1.6 oz)  - SpO2 98%  - BMI 23.20 kg/m??   CONSTITUTIONAL: in NAD, appears stated age, exam performed in wheelchair  HEENT: MMM, no oral lesions or exudates, neck supple, glasses  CARDIO: normal rate,  regular rhythm, no murmurs,   RESPIRATORY: CTAB b/l, normal WOB on RA, no conversational dyspnea. Dry- nonproductive cough noted.   GI: soft, point tenderness - epigastric region, ND, no appreciable HSM  EXTREMITIES: no LE edema  SKIN: scattered bilateral upper and lower extremity bruising. Significant bruising on abdomen secondary to heparin shots.   NEUROLOGIC: alert and oriented, no gross focal deficits noted, reports left eye vision loss   LYMPH: no supraclavicular, axillary, or cervical LAD  PSYCH: appropriate mood and affect, good insight and judgment     Labs:  Reviewed In Epic    Results for orders placed or performed in visit on 03/15/23   Comprehensive Metabolic Panel   Result Value Ref Range    Sodium 134 (L) 135 - 145 mmol/L    Potassium 5.3 (H) 3.4 - 4.8 mmol/L    Chloride 104 98 - 107 mmol/L    CO2 18.0 (L) 20.0 - 31.0 mmol/L    Anion Gap 12 5 - 14 mmol/L    BUN 78 (H) 9 - 23 mg/dL    Creatinine 5.36 (H) 0.55 - 1.02 mg/dL    BUN/Creatinine Ratio 15     eGFR CKD-EPI (2021) Female 9 (L) >=60 mL/min/1.65m2    Glucose 279 (H) 70 - 179 mg/dL    Calcium 9.5 8.7 - 64.4 mg/dL    Albumin 3.3 (L) 3.4 - 5.0 g/dL    Total Protein 7.3 5.7 - 8.2 g/dL    Total Bilirubin 1.5 (H) 0.3 - 1.2 mg/dL    AST 034 (H) <=74 U/L    ALT 202 (H) 10 - 49 U/L    Alkaline Phosphatase 592 (H) 46 - 116 U/L   CEA   Result Value Ref Range    CEA 42.0 (H) 0.0 - 5.0 ng/mL   CBC w/ Differential   Result Value Ref Range    WBC 13.6 (H) 3.6 - 11.2 10*9/L    RBC 4.00 3.95 - 5.13 10*12/L    HGB 11.3 11.3 - 14.9 g/dL    HCT 25.9 56.3 - 87.5 %    MCV 87.2 77.6 - 95.7 fL    MCH 28.2 25.9 - 32.4 pg    MCHC 32.3 32.0 - 36.0 g/dL    RDW 64.3 32.9 - 51.8 %    MPV 8.6 6.8 - 10.7 fL    Platelet 87 (L) 150 - 450 10*9/L    nRBC 0 <=4 /100 WBCs    Neutrophils % 77.4 %    Lymphocytes % 11.2 %    Monocytes % 9.8 %    Eosinophils % 1.2 %    Basophils % 0.4 %    Absolute Neutrophils 10.5 (H) 1.8 - 7.8 10*9/L    Absolute Lymphocytes 1.5 1.1 - 3.6 10*9/L    Absolute Monocytes 1.3 (H) 0.3 - 0.8 10*9/L    Absolute Eosinophils 0.2 0.0 - 0.5 10*9/L    Absolute Basophils 0.0 0.0 - 0.1 10*9/L        Pathology: 02/27/2023 invasive adenocarcinoma, moderate-to-poorly differentiated     Imaging:  I personally reviewed the following imaging studies and my interpretation is:     Brain MRI: 10/22  Numerous punctate areas of restricted diffusion involving multiple vascular territories, suspicious for acute embolic type infarcts.      -Evolving right occipital infarct. Many of the previously seen punctate infarcts are less conspicuous.      -Foci of enhancement involving the left postcentral gyrus without definitive associated restricted diffusion. Etiology is indeterminate,  but recommend follow-up MR in 6 to 8 weeks to exclude underlying metastatic lesion. insight and judgment     Labs:  Reviewed In Epic,     No results found for any visits on 03/15/23.       Imaging:  I personally reviewed the following imaging studies and my interpretation is: No new imaging to review for this encounter

## 2023-03-15 ENCOUNTER — Institutional Professional Consult (permissible substitution): Admit: 2023-03-15 | Discharge: 2023-03-16 | Payer: PRIVATE HEALTH INSURANCE

## 2023-03-15 ENCOUNTER — Ambulatory Visit: Admit: 2023-03-15 | Discharge: 2023-03-16 | Payer: PRIVATE HEALTH INSURANCE

## 2023-03-15 DIAGNOSIS — C259 Malignant neoplasm of pancreas, unspecified: Principal | ICD-10-CM

## 2023-03-15 DIAGNOSIS — C787 Secondary malignant neoplasm of liver and intrahepatic bile duct: Principal | ICD-10-CM

## 2023-03-15 LAB — COMPREHENSIVE METABOLIC PANEL
ALBUMIN: 3.3 g/dL — ABNORMAL LOW (ref 3.4–5.0)
ALKALINE PHOSPHATASE: 592 U/L — ABNORMAL HIGH (ref 46–116)
ALT (SGPT): 202 U/L — ABNORMAL HIGH (ref 10–49)
ANION GAP: 12 mmol/L (ref 5–14)
AST (SGOT): 173 U/L — ABNORMAL HIGH (ref <=34)
BILIRUBIN TOTAL: 1.5 mg/dL — ABNORMAL HIGH (ref 0.3–1.2)
BLOOD UREA NITROGEN: 78 mg/dL — ABNORMAL HIGH (ref 9–23)
BUN / CREAT RATIO: 15
CALCIUM: 9.5 mg/dL (ref 8.7–10.4)
CHLORIDE: 104 mmol/L (ref 98–107)
CO2: 18 mmol/L — ABNORMAL LOW (ref 20.0–31.0)
CREATININE: 5.22 mg/dL — ABNORMAL HIGH
EGFR CKD-EPI (2021) FEMALE: 9 mL/min/{1.73_m2} — ABNORMAL LOW (ref >=60)
GLUCOSE RANDOM: 279 mg/dL — ABNORMAL HIGH (ref 70–179)
POTASSIUM: 5.3 mmol/L — ABNORMAL HIGH (ref 3.4–4.8)
PROTEIN TOTAL: 7.3 g/dL (ref 5.7–8.2)
SODIUM: 134 mmol/L — ABNORMAL LOW (ref 135–145)

## 2023-03-15 LAB — CBC W/ AUTO DIFF
BASOPHILS ABSOLUTE COUNT: 0 10*9/L (ref 0.0–0.1)
BASOPHILS RELATIVE PERCENT: 0.4 %
EOSINOPHILS ABSOLUTE COUNT: 0.2 10*9/L (ref 0.0–0.5)
EOSINOPHILS RELATIVE PERCENT: 1.2 %
HEMATOCRIT: 34.9 % (ref 34.0–44.0)
HEMOGLOBIN: 11.3 g/dL (ref 11.3–14.9)
LYMPHOCYTES ABSOLUTE COUNT: 1.5 10*9/L (ref 1.1–3.6)
LYMPHOCYTES RELATIVE PERCENT: 11.2 %
MEAN CORPUSCULAR HEMOGLOBIN CONC: 32.3 g/dL (ref 32.0–36.0)
MEAN CORPUSCULAR HEMOGLOBIN: 28.2 pg (ref 25.9–32.4)
MEAN CORPUSCULAR VOLUME: 87.2 fL (ref 77.6–95.7)
MEAN PLATELET VOLUME: 8.6 fL (ref 6.8–10.7)
MONOCYTES ABSOLUTE COUNT: 1.3 10*9/L — ABNORMAL HIGH (ref 0.3–0.8)
MONOCYTES RELATIVE PERCENT: 9.8 %
NEUTROPHILS ABSOLUTE COUNT: 10.5 10*9/L — ABNORMAL HIGH (ref 1.8–7.8)
NEUTROPHILS RELATIVE PERCENT: 77.4 %
NUCLEATED RED BLOOD CELLS: 0 /100{WBCs} (ref <=4)
PLATELET COUNT: 87 10*9/L — ABNORMAL LOW (ref 150–450)
RED BLOOD CELL COUNT: 4 10*12/L (ref 3.95–5.13)
RED CELL DISTRIBUTION WIDTH: 15.1 % (ref 12.2–15.2)
WBC ADJUSTED: 13.6 10*9/L — ABNORMAL HIGH (ref 3.6–11.2)

## 2023-03-15 LAB — CEA: CARCINOEMBRYONIC ANTIGEN: 42 ng/mL — ABNORMAL HIGH (ref 0.0–5.0)

## 2023-03-15 LAB — CANCER ANTIGEN 19-9: CA 19-9: 64.44 U/mL — ABNORMAL HIGH (ref 0–35)

## 2023-03-15 MED ORDER — NICOTINE 7 MG/24 HR DAILY TRANSDERMAL PATCH
MEDICATED_PATCH | Freq: Every day | TRANSDERMAL | 0 refills | 28.00000 days | Status: CP
Start: 2023-03-15 — End: ?

## 2023-03-15 NOTE — Unmapped (Addendum)
Called and spoke to [facility] and provided patient information. Since patient is in clinic, they will give her time to get home and then call to set up visit. Order entered.     Received call back from [place] at [facility]. Unfortunately, Ms. Blanchfield is outside of their service area [facility]). [facility] Nurse Navigator is aware and will reach out to daughter to discuss other options.

## 2023-03-15 NOTE — Unmapped (Addendum)
Spoke with pt daughter Morrie Sheldon to let her know [facility] [place] does not cover the [place] area.  Daughter would like to use [facility] as they do have an inpt hospice facility if needed for symptom mgnt.  Will cancel MRI pelvis for tomorrow and will let hospice cancel any appts after they admit pt that hospice wont cover.  Spoke with Star at [employer] [employer] in [place] and they have also spoken with pt daughter Morrie Sheldon to set up a hospice [facility] for tomorrow at 11am.      2:46 PM  Had a call back from Star at [facility] pt insurance is not in network with them.  Per Star pt is in network with [facility] hospice.  Referral placed for [facility] hospice and have reached out to [place] with [facility] hospice to contact daughter for a home hospice SOC.

## 2023-03-15 NOTE — Unmapped (Signed)
Hi,     Drenda Freeze w/[facility] contacted the [employer] requesting to speak with the care team of Amy [name] to discuss:    Requests a call back from [facility] [employer]. The patient's home address if out of service area    Please contact Drenda Freeze  at [phone].    Thank you,   Yehuda Mao  [facility]   [phone]

## 2023-03-17 ENCOUNTER — Inpatient Hospital Stay
Admission: EM | Admit: 2023-03-17 | Discharge: 2023-04-16 | DRG: 951 | Disposition: E | Payer: BLUE CROSS/BLUE SHIELD | Attending: Internal Medicine | Admitting: Internal Medicine

## 2023-03-17 ENCOUNTER — Encounter: Payer: Self-pay | Admitting: Emergency Medicine

## 2023-03-17 ENCOUNTER — Other Ambulatory Visit: Payer: Self-pay

## 2023-03-17 DIAGNOSIS — C787 Secondary malignant neoplasm of liver and intrahepatic bile duct: Secondary | ICD-10-CM | POA: Diagnosis present

## 2023-03-17 DIAGNOSIS — C7931 Secondary malignant neoplasm of brain: Secondary | ICD-10-CM | POA: Diagnosis present

## 2023-03-17 DIAGNOSIS — Z8673 Personal history of transient ischemic attack (TIA), and cerebral infarction without residual deficits: Secondary | ICD-10-CM | POA: Diagnosis not present

## 2023-03-17 DIAGNOSIS — Z515 Encounter for palliative care: Secondary | ICD-10-CM | POA: Diagnosis present

## 2023-03-17 DIAGNOSIS — E785 Hyperlipidemia, unspecified: Secondary | ICD-10-CM | POA: Diagnosis present

## 2023-03-17 DIAGNOSIS — N184 Chronic kidney disease, stage 4 (severe): Secondary | ICD-10-CM | POA: Diagnosis present

## 2023-03-17 DIAGNOSIS — Z794 Long term (current) use of insulin: Secondary | ICD-10-CM

## 2023-03-17 DIAGNOSIS — F17210 Nicotine dependence, cigarettes, uncomplicated: Secondary | ICD-10-CM | POA: Diagnosis present

## 2023-03-17 DIAGNOSIS — Z882 Allergy status to sulfonamides status: Secondary | ICD-10-CM

## 2023-03-17 DIAGNOSIS — K920 Hematemesis: Secondary | ICD-10-CM | POA: Diagnosis present

## 2023-03-17 DIAGNOSIS — N179 Acute kidney failure, unspecified: Secondary | ICD-10-CM | POA: Diagnosis present

## 2023-03-17 DIAGNOSIS — Z66 Do not resuscitate: Secondary | ICD-10-CM | POA: Diagnosis present

## 2023-03-17 DIAGNOSIS — G8929 Other chronic pain: Secondary | ICD-10-CM | POA: Diagnosis present

## 2023-03-17 DIAGNOSIS — Z885 Allergy status to narcotic agent status: Secondary | ICD-10-CM | POA: Diagnosis not present

## 2023-03-17 DIAGNOSIS — R296 Repeated falls: Secondary | ICD-10-CM | POA: Diagnosis present

## 2023-03-17 DIAGNOSIS — I959 Hypotension, unspecified: Secondary | ICD-10-CM

## 2023-03-17 DIAGNOSIS — E1122 Type 2 diabetes mellitus with diabetic chronic kidney disease: Secondary | ICD-10-CM | POA: Diagnosis present

## 2023-03-17 DIAGNOSIS — I2510 Atherosclerotic heart disease of native coronary artery without angina pectoris: Secondary | ICD-10-CM | POA: Diagnosis present

## 2023-03-17 DIAGNOSIS — J449 Chronic obstructive pulmonary disease, unspecified: Secondary | ICD-10-CM | POA: Diagnosis present

## 2023-03-17 DIAGNOSIS — G9341 Metabolic encephalopathy: Secondary | ICD-10-CM | POA: Diagnosis present

## 2023-03-17 DIAGNOSIS — J9601 Acute respiratory failure with hypoxia: Secondary | ICD-10-CM

## 2023-03-17 DIAGNOSIS — C259 Malignant neoplasm of pancreas, unspecified: Principal | ICD-10-CM

## 2023-03-17 DIAGNOSIS — E1165 Type 2 diabetes mellitus with hyperglycemia: Secondary | ICD-10-CM | POA: Diagnosis present

## 2023-03-17 DIAGNOSIS — K625 Hemorrhage of anus and rectum: Secondary | ICD-10-CM | POA: Diagnosis present

## 2023-03-17 MED ORDER — MORPHINE SULFATE (CONCENTRATE) 10 MG/0.5ML PO SOLN
5.0000 mg | ORAL | Status: DC | PRN
Start: 2023-03-17 — End: 2023-03-17
  Filled 2023-03-17: qty 0.5

## 2023-03-17 MED ORDER — ONDANSETRON 4 MG PO TBDP: 8.0000 mg | ORAL_TABLET | Freq: Once | ORAL | Status: AC

## 2023-03-17 MED ORDER — LORAZEPAM 1 MG PO TABS: 1.0000 mg | ORAL_TABLET | ORAL | Status: DC | PRN

## 2023-03-17 MED ORDER — OXYCODONE-ACETAMINOPHEN 5-325 MG PO TABS
1.0000 | ORAL_TABLET | ORAL | Status: DC | PRN
  Administered 2023-03-17: 1 via ORAL
  Filled 2023-03-17: qty 1

## 2023-03-17 MED ORDER — HALOPERIDOL LACTATE 5 MG/ML IJ SOLN: 0.5000 mg | INTRAMUSCULAR | Status: DC | PRN

## 2023-03-17 MED ORDER — OXYCODONE-ACETAMINOPHEN 5-325 MG PO TABS: 1.0000 | ORAL_TABLET | ORAL | Status: DC | PRN

## 2023-03-17 MED ORDER — HALOPERIDOL LACTATE 2 MG/ML PO CONC: 0.5000 mg | ORAL | Status: DC | PRN

## 2023-03-17 MED ORDER — LORAZEPAM 2 MG/ML IJ SOLN: 1.0000 mg | INTRAMUSCULAR | Status: DC | PRN

## 2023-03-17 MED ORDER — HYDROMORPHONE HCL 2 MG PO TABS: 2.0000 mg | ORAL_TABLET | ORAL | Status: DC | PRN

## 2023-03-17 MED ORDER — LORAZEPAM 2 MG/ML PO CONC: 1.0000 mg | ORAL | Status: DC | PRN

## 2023-03-17 MED ORDER — DICLOFENAC SODIUM 1 % EX GEL: 2.0000 g | Freq: Two times a day (BID) | CUTANEOUS | Status: DC | PRN

## 2023-03-17 MED ORDER — HALOPERIDOL 0.5 MG PO TABS: 0.5000 mg | ORAL_TABLET | ORAL | Status: DC | PRN

## 2023-03-17 MED ORDER — DIPHENHYDRAMINE HCL 12.5 MG/5ML PO ELIX
25.0000 mg | ORAL_SOLUTION | Freq: Once | ORAL | Status: AC
  Administered 2023-03-17: 25 mg via ORAL
  Filled 2023-03-17: qty 10

## 2023-03-17 MED ORDER — ONDANSETRON 4 MG PO TBDP
ORAL_TABLET | ORAL | Status: AC
  Administered 2023-03-17: 8 mg via ORAL
  Filled 2023-03-17: qty 1

## 2023-03-17 MED ORDER — LORAZEPAM 1 MG PO TABS
1.0000 mg | ORAL_TABLET | ORAL | Status: DC | PRN
Start: 2023-03-17 — End: 2023-03-17

## 2023-03-17 MED ORDER — CALCIUM CARBONATE ANTACID 500 MG PO CHEW
1.0000 | CHEWABLE_TABLET | Freq: Two times a day (BID) | ORAL | Status: DC | PRN
  Administered 2023-03-17: 200 mg via ORAL
  Filled 2023-03-17: qty 1

## 2023-03-17 MED ORDER — HALOPERIDOL 0.5 MG PO TABS
0.5000 mg | ORAL_TABLET | ORAL | Status: DC | PRN
Start: 2023-03-17 — End: 2023-03-17

## 2023-03-17 MED ORDER — LORAZEPAM 2 MG/ML IJ SOLN
1.0000 mg | INTRAMUSCULAR | Status: DC | PRN
Start: 2023-03-17 — End: 2023-03-17

## 2023-03-17 MED ORDER — CALCIUM CARBONATE ANTACID 500 MG PO CHEW
1.0000 | CHEWABLE_TABLET | Freq: Once | ORAL | Status: AC
  Administered 2023-03-17: 200 mg via ORAL
  Filled 2023-03-17: qty 1

## 2023-03-17 NOTE — ED Notes (Signed)
Patient's daughter requesting water for her mother. Patient was able to take a few sips then went back to sleep.

## 2023-03-17 NOTE — ED Notes (Signed)
This RN and Erie Noe RN witness to patient stating to MD York Cerise, patient does not wish to be intubated or have any life-saving measures performed. Patient refuses IV insertion for any medications at this time as well. DNR and MOST form placed in room. Patient alert and oriented x4 when making these decisions. Pt does consent to taking PO nausea medication and medication to help with itching at this time.

## 2023-03-17 NOTE — ED Notes (Signed)
Patient en route to inpatient unit.

## 2023-03-17 NOTE — Consult Note (Cosign Needed Addendum)
Consultation Note Date: 03/24/2023   Patient Name: Nicole Wolf  DOB: 1960/10/10  MRN: [id number]  Age / Sex: 62 y.o., female  PCP: Emogene Morgan, MD Referring Physician: Emeline General, MD  Reason for Consultation: Establishing goals of care   HPI/Brief Hospital Course: 62 y.o. female  with past medical history of invasive pancreatic adenocarcinoma with numerous liver mets followed by [facility] oncology, recent embolic stroke with associated seizures, CKD stage IV admitted from home on 04/03/2023 with coffee-ground emesis/bright red blood per rectum.  On arrival to ED Nicole Wolf presented her DNR form as well as MOST form indicating comfort care measures only. In speaking with her daughter Nicole Wolf she shares mystical was set to have her initial in-home assessment today with [facility] hosp[facility].  Nicole Wolf shares she also feels her mother had a fall/multiple falls at home prior to admission.  Palliative medicine was consulted for assisting with goals of care conversation.  Subjective:  Extensive chart review has been completed prior to meeting patient including labs, vital signs, imaging, progress notes, orders, and available advanced directive documents from current and previous encounters.  Visited with Nicole Wolf at her bedside.  She is resting in bed with eyes closed, does acknowledge my presence in the room and will respond to simple questions but will easily drift off back to sleep without redirection.  Daughter Nicole Wolf at bedside during time of visit.  Introduced myself as a Publishing rights manager as a member of the palliative care team. Explained palliative medicine is specialized medical care for people living with serious illness. It focuses on providing relief from the symptoms and stress of a serious illness. The goal is to improve quality of life for both the patient and the family.   Since arrival to ED Nicole Wolf has refused IV placement as well as lab draws.   She has also been hesitant to take any p.o. medications.  Nicole Wolf confirms her mother is not currently married and she does not have any siblings.  There is not a completed advanced directive on file.  Nicole Wolf shares that her mother currently lives with her and goal is to get her back home with hospice following but Nicole Wolf also voices concern related to being able to provide the care her mom needs in the home.  According to [place] her initial hospice agency choice was [facility] as they have a local IPU but they are not accepting of her mothers current insurance plan. She also shares she has been accepted into [facility] but Nicole Wolf is unable to make the drive to [place] to their IPU.  Shared with Nicole Wolf my concern as well for her being able to care for her mom at home if she is not able to be there at all times as well as feeling safe enough to provide care to her mom.  We also discussed the concern her mom may not be stable for transport.   Shared with Nicole Wolf at this time I will be working on recommending an appropriate safe disposition as well as focusing on keeping her mom comfortable while she remains in the hospital for which actually agreed.  Have been in collaboration with [facility] as well as [facility] hospice liaison and formulating the best plan for Nicole Wolf moving forward.  At this time it is most appropriate to assess Nicole Wolf over the next 24 to 48 hours when determining the most appropriate disposition plan.  Review of MAR changes made, morphine discontinued due to significant CKD.  P.o. Dilaudid as needed ordered for severe pain, Percocet as needed for moderate pain remains.  Modified lorazepam and Haldol orders to IM as Nicole Wolf continues to refuse IV placement.  All questions/concerns addressed. Emotional support provided to patient/family/support persons. PMT will continue to follow and support patient as needed.  Objective: Primary Diagnoses: Present on Admission:   Hematemesis   Vital Signs: BP (!) 70/38 (BP Location: Left Arm)   Pulse 80   Temp 97.8 F (36.6 C) (Axillary)   Resp 17   LMP  (LMP Unknown)   SpO2 100%  Pain Scale: 0-10   Pain Score: 0-No pain    IO: Intake/output summary: No intake or output data in the 24 hours ending 03/27/2023 1634  LBM:   Baseline Weight:   Most recent weight:         Assessment and Plan  SUMMARY OF RECOMMENDATIONS   DNR/DNI/Comfort Recommend IM Fentanyl if sudden/severe pain develops that cannot be controlled with PO if IV not in place Utilize IM Lorazepam for seizure activity as needed Disposition planning needed-possibly anticipating hospital death PMT to continue to follow for ongoing needs and support  Discussed With: [facility], nursing staff and primary team   Thank you for this consult and allowing Palliative Medicine to participate in the care of Nicole Wolf. Palliative medicine will continue to follow and assist as needed.   Time Total: 75 minutes  Time spent includes: Detailed review of medical records (labs, imaging, vital signs), medically appropriate exam (mental status, respiratory, cardiac, skin), discussed with treatment team, counseling and educating patient, family and staff, documenting clinical information, medication management and coordination of care.   Signed by: Leeanne Deed, DNP, AGNP-C Palliative Medicine    Please contact Palliative Medicine Team phone at [phone] for questions and concerns.  For individual provider: See Loretha Stapler

## 2023-03-17 NOTE — ED Triage Notes (Signed)
Pt presents via acems with c/o vomiting blood at home. Patient's family reported last talked to patient this am, but hadnt heard from her since. Patient reports has thrown up significant amount of blood today and had possible "seizure". Patient remembers passing out and hitting head at home, but unsure of what caused her to pass out. Pt has history of pancreatic cancer. Has DNR and MOST form present upon arrival to ED. Pt alert and oriented upon arrival to ED. MD York Cerise at bedside. Patient reports had Hospice assessment scheduled for today. Patient was found in bathroom floor upon ems arrival. Patient declining all IV medications/ fluids, and PO pain medications. Patient reports pain medications cause her to itch.

## 2023-03-17 NOTE — ED Notes (Signed)
Informed RN bed assigned 

## 2023-03-17 NOTE — H&P (Signed)
History and Physical    Nicole Wolf XBJ:[id number] DOB: 01-17-1961 DOA: 04/04/2023  PCP: Emogene Morgan, MD (Confirm with patient/family/NH records and if not entered, this has to be entered at [facility] point of entry) Patient coming from: Home  I have personally briefly reviewed patient's old medical records in [facility]  Chief Complaint: Vomiting blood  HPI: Nicole Wolf is a 62 y.o. 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  Patient was found on the bathroom floor unresponsive with coffee-ground vomitus around her and surrounding walls and floors.  At baseline, patient was diagnosed with stage IV metastatic pancreatic cancer to brain about 1 month ago and after discussion with oncology and other specialties, decision was made for hospice care.  Patient is to set up home hospice for the first time as of today.  However what happened today at home brought patient to hospital instead. ED Course: Hypotensive and tachypneic not hypoxic.  Patient is drowsy and intermittently waking up asking for water.  Review of Systems: Unable to perform, patient is sleepy  Past Medical History:  Diagnosis Date   Diabetes mellitus without complication (HCC)     Past Surgical History:  Procedure Laterality Date   EYE SURGERY       reports that she has been smoking. She has never used smokeless tobacco. She reports that she does not drink alcohol and does not use drugs.  Allergies  Allergen Reactions   Codeine    Morphine And Codeine    Sulfa Antibiotics     Family History  Problem Relation Age of Onset   Breast cancer Neg Hx      Prior to Admission medications   Medication Sig Start Date End Date Taking? Authorizing Provider  oxyCODONE-acetaminophen (ROXICET) 5-325 MG tablet Take 1 tablet by mouth every 4 (four) hours as needed for severe pain. 09/16/16   Darci Current, MD    Physical Exam: Vitals:   03/22/2023 0630 03/21/2023 0700 04/08/2023 0921 03/27/2023 0930  BP:    (!) 79/42  Pulse: (!) 122 76  78  Resp: (!) 22 16  17   Temp:   98.1 F (36.7 C)   TempSrc:   Oral   SpO2: 98% (!) 74%  100%    Constitutional: NAD, calm, comfortable Vitals:   04/12/2023 0630 03/26/2023 0700 03/31/2023 0921 03/23/2023 0930  BP:    (!) 79/42  Pulse: (!) 122 76  78  Resp: (!) 22 16  17   Temp:   98.1 F (36.7 C)   TempSrc:   Oral   SpO2: 98% (!) 74%  100%   Eyes: PERRL, lids and conjunctivae normal ENMT: Mucous membranes are moist. Posterior pharynx clear of any exudate or lesions.Normal dentition.  Neck: normal, supple, no masses, no thyromegaly Respiratory: clear to auscultation bilaterally, no wheezing, no crackles. Normal respiratory effort. No accessory muscle use.  Cardiovascular: Regular rate and rhythm, no murmurs / rubs / gallops. No extremity edema. 2+ pedal pulses. No carotid bruits.  Abdomen: no tenderness, no masses palpated. No hepatosplenomegaly. Bowel sounds positive.  Musculoskeletal: no clubbing / cyanosis. No joint deformity upper and lower extremities. Good ROM, no contractures. Normal muscle tone.  Skin: no rashes, lesions, ulcers. No induration Neurologic: No facial droops, moving limbs occasionally, not following commands Psychiatric: Arousable, sleepy, confused   Labs on Admission: I have personally reviewed following labs and  imaging studies  CBC: No results for input(s): "WBC", "NEUTROABS", "HGB", "HCT", "MCV", "PLT" in the last 168 hours. Basic Metabolic Panel: No results for input(s): "NA", "K", "CL", "CO2", "GLUCOSE", "BUN", "CREATININE", "CALCIUM", "MG", "PHOS" in the last 168 hours. GFR: CrCl cannot be calculated (No successful lab value found.). Liver Function Tests: No results for input(s): "AST", "ALT", "ALKPHOS", "BILITOT", "PROT", "ALBUMIN" in the last 168 hours. No results for input(s): "LIPASE", "AMYLASE" in the last 168  hours. No results for input(s): "AMMONIA" in the last 168 hours. Coagulation Profile: No results for input(s): "INR", "PROTIME" in the last 168 hours. Cardiac Enzymes: No results for input(s): "CKTOTAL", "CKMB", "CKMBINDEX", "TROPONINI" in the last 168 hours. BNP (last 3 results) No results for input(s): "PROBNP" in the last 8760 hours. HbA1C: No results for input(s): "HGBA1C" in the last 72 hours. CBG: No results for input(s): "GLUCAP" in the last 168 hours. Lipid Profile: No results for input(s): "CHOL", "HDL", "LDLCALC", "TRIG", "CHOLHDL", "LDLDIRECT" in the last 72 hours. Thyroid Function Tests: No results for input(s): "TSH", "T4TOTAL", "FREET4", "T3FREE", "THYROIDAB" in the last 72 hours. Anemia Panel: No results for input(s): "VITAMINB12", "FOLATE", "FERRITIN", "TIBC", "IRON", "RETICCTPCT" in the last 72 hours. Urine analysis: No results found for: "COLORURINE", "APPEARANCEUR", "LABSPEC", "PHURINE", "GLUCOSEU", "HGBUR", "BILIRUBINUR", "KETONESUR", "PROTEINUR", "UROBILINOGEN", "NITRITE", "LEUKOCYTESUR"  Radiological Exams on Admission: No results found.  EKG: Independently reviewed.  Sinus, ST depression on multiple leads, no previous EKG to compare with.  Assessment/Plan Principal Problem:   Hematemesis  (please populate well all problems here in Problem List. (For example, if patient is on BP meds at home and you resume or decide to hold them, it is a problem that needs to be her. Same for CAD, COPD, HLD and so on)  Hematemesis -Likely secondary to metastatic pancreatic cancer, complicated with worsening of uremia likely.  Discussed with brother at bedside as well as daughter, goal of care is hospice.  Life expectancy few days to few weeks.  Inpatient hospice preferred, if patient survives for the next 2 days, likely can go to subacute unit for further hospice care.  Hospice care on board. -Discussed with hospice team, agreed with changing morphine to Dilaudid as patient's  family reported the patient does not respond to morphine.  Acute metabolic encephalopathy -Multifactorial from brain metastasis lesions, likely concurrent acid and base electrolyte abnormalities as above  Metastatic pancreatic cancer -DNR and hospice care.  Patient refused IV and p.o. medication as well  IDDM with hyperglycemia AKI on CKD stage IV -Hospice care, refused insulin  DVT prophylaxis: None Code Status: DNR Family Communication: Brother at bedside Disposition Plan: Expect more than 2 midnight hospital stay, expect patient discharged to inpatient hospice unit Consults called: Hospice care Admission status: Palliative care unit   Emeline General MD [facility] Pager [phone]  04/05/2023, 11:47 AM

## 2023-03-17 NOTE — ED Provider Notes (Signed)
[facility] Provider Note    Event Date/Time   First MD Initiated Contact with Patient 04/12/2023 [id number]     (approximate)   History   Hypotension  Level 5 caveat:  history/ROS limited by acute/critical illness  HPI Nicole Wolf is a 62 y.o. female whose medical history most notably includes end-stage pancreatic cancer that has spread to her liver.  She is under hospice care and comes by EMS with DNR and MOST forms with her, indicating no treatment beyond comfort measures.  Reportedly she has been cared for at home, and EMS was called because she began to either have copious vomiting, diarrhea, or both.  The hospice nurse is not scheduled to come in until later this morning and her daughter was concerned that there was nothing that she could do.  EMS reports that she was initially satting in the 70s and had a blood pressure of about 70/40.  The patient is awake and alert despite her acute and chronic issues.  She remembers passing out and hitting her head at home.  However at this time she is declining anything other than her chronic abdominal pain.  She thinks that she was vomiting earlier.  She is able to verbalize her current conditions and adamantly reinforces her desire to be DNR/DNI with no interventions, including declining IV placement for analgesia.     Physical Exam   Triage Vital Signs: ED Triage Vitals  Encounter Vitals Group     BP 03/19/2023 0552 (!) 71/42     Systolic BP Percentile --      Diastolic BP Percentile --      Pulse Rate 04/01/2023 0552 77     Resp 03/28/2023 0552 14     Temp 03/21/2023 0552 (!) 97.5 F (36.4 C)     Temp Source 03/22/2023 0552 Oral     SpO2 04/14/2023 0552 98 %     Weight --      Height --      Head Circumference --      Peak Flow --      Pain Score 04/04/2023 0558 5     Pain Loc --      Pain Education --      Exclude from Growth Chart --     Most recent vital signs: Vitals:   03/28/2023 0630 03/24/2023 0700  BP:     Pulse: (!) 122 76  Resp: (!) 22 16  Temp:    SpO2: 98% (!) 74%    General: Appears chronically ill but awake and alert. CV:  Poor peripheral perfusion with pallor.  Regular rate and rhythm. Resp:  Normal effort. Speaking easily and comfortably despite initial hypoxia, no accessory muscle usage nor intercostal retractions.   Abd:  No distention.  Mild generalized tenderness palpation throughout the abdomen without peritonitis. Other:  Patient is calm, answering questions appropriately, and seems to have good insight and judgment into her current situation.  Strongly supports her prior decision of DNR/DNI with no measures beyond comfort care.  In my judgment she has the capacity to make her own decisions and is showing no evidence of encephalopathy at this time.   ED Results / Procedures / Treatments   Labs (all labs ordered are listed, but only abnormal results are displayed) Labs Reviewed - No data to display   EKG  ED ECG REPORT I, Loleta Rose, the attending physician, personally viewed and interpreted this ECG.  Date: 03/25/2023 EKG Time: 5:52 AM Rate: 76  Rhythm: normal sinus rhythm QRS Axis: normal Intervals: normal ST/T Wave abnormalities: ST depression is notable in the inferior leads, suggestive of ischemia, but not meeting criteria for STEMI Narrative Interpretation: Probable inferior ischemia    PROCEDURES:  Critical Care performed: No  Procedures    IMPRESSION / MDM / ASSESSMENT AND PLAN / ED COURSE  I reviewed the triage vital signs and the nursing notes.                              Differential diagnosis includes, but is not limited to, upper GI bleed, lower GI bleed, sepsis, renal failure, liver failure, respiratory failure, impending cardiovascular collapse, metabolic encephalopathy, intracranial injury.  Patient's presentation is most consistent with acute presentation with potential threat to life or bodily function.   Interventions/Medications  given:  Medications  morphine CONCENTRATE 10 MG/0.5ML oral solution 5 mg (has no administration in time range)    Or  morphine CONCENTRATE 10 MG/0.5ML oral solution 5 mg (has no administration in time range)  LORazepam (ATIVAN) tablet 1 mg (has no administration in time range)    Or  LORazepam (ATIVAN) 2 MG/ML concentrated solution 1 mg (has no administration in time range)    Or  LORazepam (ATIVAN) injection 1 mg (has no administration in time range)  haloperidol (HALDOL) tablet 0.5 mg (has no administration in time range)    Or  haloperidol (HALDOL) 2 MG/ML solution 0.5 mg (has no administration in time range)    Or  haloperidol lactate (HALDOL) injection 0.5 mg (has no administration in time range)  ondansetron (ZOFRAN-ODT) disintegrating tablet 8 mg (8 mg Oral Given 03/29/2023 0603)  diphenhydrAMINE (BENADRYL) 12.5 MG/5ML elixir 25 mg (25 mg Oral Given 04/07/2023 0607)    (Note:  hospital course my include additional interventions and/or labs/studies not listed above.)  The patient is adamant that she only really receive oral medications and does not want an IV nor any IM medications.  She is obviously in the end stages of her life and she understands this as well and has the capacity to make her own decisions.  I ordered oral antianxiety medication, antiemetics, and analgesia from the comfort care order set.  The patient's daughter and brother arrived shortly after the patient.  I had an extensive conversation with them and they both supported her decision to be comfort care only.  They also supported my plan to admit her to the hospital service for additional comfort care, at least until a better timeframe for her demise can be more accurately estimated.  They agree with this plan and the patient also would like to proceed with this action.    Clinical Course as of 03/21/2023 9562  Caleen Essex Mar 17, 2023  1308 Consulted with Dr. Arville Care of the hospitalist service who will admit the patient. [CF]     Clinical Course User Index [CF] Loleta Rose, MD     FINAL CLINICAL IMPRESSION(S) / ED DIAGNOSES   Final diagnoses:  Pancreatic cancer metastasized to liver [facility])  Acute respiratory failure with hypoxia (HCC)  Hypotension, unspecified hypotension type  DNR (do not resuscitate)  Palliative care patient     Rx / DC Orders   ED Discharge Orders     None        Note:  This document was prepared using Dragon voice recognition software and may include unintentional dictation errors.   Loleta Rose, MD 03/28/[id number]

## 2023-03-17 NOTE — Unmapped (Signed)
Had message from [facility] hospice late yesterday that pt daughter Morrie Sheldon wanted to switch to [facility] home hospice and daughter was in contact with that agency.  I called pt daughter this morning to make sure [facility] was able to admit the pt.  Per daughter Beverly Gust was to come for admission this morning at 9am but pt woke up having seizures around 5am and daughter had to call 911.  Pt was taken to [facility] and daughter is in contact with [facility] hospice if/when pt is able to come home.

## 2023-03-18 DIAGNOSIS — C787 Secondary malignant neoplasm of liver and intrahepatic bile duct: Secondary | ICD-10-CM

## 2023-03-18 DIAGNOSIS — C259 Malignant neoplasm of pancreas, unspecified: Principal | ICD-10-CM

## 2023-03-18 DIAGNOSIS — J9601 Acute respiratory failure with hypoxia: Secondary | ICD-10-CM

## 2023-03-18 DIAGNOSIS — I959 Hypotension, unspecified: Secondary | ICD-10-CM

## 2023-04-16 NOTE — Progress Notes (Signed)
Daughter at bedside, called RN to pt room. States "I do not think my mom is breathing." Pt found apneic with faint heartbeat auscultated, HR 10. Dr. Nelson Chimes notified and called to bedside.

## 2023-04-16 NOTE — Plan of Care (Signed)
  Problem: Education: Goal: Knowledge of the prescribed therapeutic regimen will improve Outcome: Progressing   Problem: Coping: Goal: Ability to identify and develop effective coping behavior will improve Outcome: Progressing   Problem: Clinical Measurements: Goal: Quality of life will improve Outcome: Progressing

## 2023-04-16 NOTE — Death Summary Note (Signed)
   DEATH SUMMARY   Patient Details  Name: Nicole Wolf MRN: [id number] DOB: 07/27/1960 [id number], Sylvie Farrier, MD Admission/Discharge Information   Admit Date:  2023/03/22  Date of Death:  03-23-2023  Time of Death:  8:15 AM  Length of Stay: 1   Principle Cause of death: Metastatic pancreatic cancer  Hospital Diagnoses: Principal Problem:   Hematemesis   Hospital Course: Darrah M Buehner is a 62 y.o. 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   Patient was found on the bathroom floor unresponsive with coffee-ground vomitus around her and surrounding walls and floors.  At baseline, patient was diagnosed with stage IV metastatic pancreatic cancer to brain about 1 month ago and after discussion with oncology and other specialties, decision was made for hospice care.  Patient was recently admitted at [facility] and sent home with hospice a day prior to this admission.  Patient is to set up home hospice for the first time as of day of admission.  However what happened today at home brought patient to hospital instead.  Patient was hypotensive and tachypneic.  Very drowsy but intermittently asking for water on admission.  Family decided to go with comfort care only.  Hospice team was involved. She was started on Dilaudid as morphine was not apparently doing well and patient was also having renal failure.  Patient passed peacefully in the presence of daughter and pronounced dead at 8:15 AM.  Procedures: None  Consultations: Palliative care  The results of significant diagnostics from this hospitalization (including imaging, microbiology, ancillary and laboratory) are listed below for reference.   Significant Diagnostic Studies: No results found.  Microbiology: No results found for this or any previous visit (from the past 240 hour(s)).  Time spent:   minutes  Signed: Arnetha Courser, MD 03/23/23

## 2023-04-16 DEATH — deceased

## 2023-06-09 NOTE — Unmapped (Signed)
[employer] [place] called to extend List, Notified [employer] patient passed.  Auth closed.
